# Patient Record
Sex: Male | Born: 1935 | Race: White | Hispanic: No | Marital: Married | Smoking: Former smoker
Health system: Southern US, Community
[De-identification: ages and names within clinical notes are randomized; demographics above are authoritative.]

## PROBLEM LIST (undated history)

## (undated) DIAGNOSIS — I5022 Chronic systolic (congestive) heart failure: Secondary | ICD-10-CM

## (undated) DIAGNOSIS — N183 Chronic kidney disease, stage 3 unspecified: Secondary | ICD-10-CM

## (undated) DIAGNOSIS — N410 Acute prostatitis: Secondary | ICD-10-CM

## (undated) DIAGNOSIS — M542 Cervicalgia: Secondary | ICD-10-CM

## (undated) DIAGNOSIS — I714 Abdominal aortic aneurysm, without rupture, unspecified: Secondary | ICD-10-CM

## (undated) DIAGNOSIS — I255 Ischemic cardiomyopathy: Secondary | ICD-10-CM

## (undated) DIAGNOSIS — I739 Peripheral vascular disease, unspecified: Secondary | ICD-10-CM

## (undated) DIAGNOSIS — I4891 Unspecified atrial fibrillation: Secondary | ICD-10-CM

## (undated) DIAGNOSIS — Z7901 Long term (current) use of anticoagulants: Secondary | ICD-10-CM

## (undated) DIAGNOSIS — I639 Cerebral infarction, unspecified: Secondary | ICD-10-CM

## (undated) DIAGNOSIS — H9190 Unspecified hearing loss, unspecified ear: Secondary | ICD-10-CM

## (undated) DIAGNOSIS — H8309 Labyrinthitis, unspecified ear: Secondary | ICD-10-CM

## (undated) DIAGNOSIS — Z95810 Presence of automatic (implantable) cardiac defibrillator: Secondary | ICD-10-CM

## (undated) DIAGNOSIS — I10 Essential (primary) hypertension: Secondary | ICD-10-CM

## (undated) HISTORY — DX: Ischemic cardiomyopathy: I25.5

## (undated) HISTORY — DX: Abdominal aortic aneurysm, without rupture, unspecified: I71.40

## (undated) HISTORY — PX: CARDIOVERSION: SHX1299

## (undated) HISTORY — DX: Cerebral infarction, unspecified: I63.9

## (undated) HISTORY — DX: Labyrinthitis, unspecified ear: H83.09

## (undated) HISTORY — DX: Presence of automatic (implantable) cardiac defibrillator: Z95.810

## (undated) HISTORY — DX: Acute prostatitis: N41.0

## (undated) HISTORY — DX: Abdominal aortic aneurysm, without rupture: I71.4

## (undated) HISTORY — DX: Cervicalgia: M54.2

## (undated) HISTORY — DX: Chronic systolic (congestive) heart failure: I50.22

## (undated) HISTORY — DX: Peripheral vascular disease, unspecified: I73.9

## (undated) HISTORY — DX: Unspecified atrial fibrillation: I48.91

## (undated) HISTORY — DX: Essential (primary) hypertension: I10

## (undated) HISTORY — DX: Unspecified hearing loss, unspecified ear: H91.90

---

## 1989-03-22 HISTORY — PX: CORONARY ARTERY BYPASS GRAFT: SHX141

## 1996-07-15 HISTORY — PX: COLOSTOMY: SHX63

## 1996-07-15 HISTORY — PX: COLECTOMY: SHX59

## 1997-06-25 ENCOUNTER — Ambulatory Visit (HOSPITAL_COMMUNITY): Admission: RE | Admit: 1997-06-25 | Discharge: 1997-06-25 | Payer: Self-pay | Admitting: *Deleted

## 1997-09-14 ENCOUNTER — Inpatient Hospital Stay (HOSPITAL_COMMUNITY): Admission: EM | Admit: 1997-09-14 | Discharge: 1997-09-17 | Payer: Self-pay | Admitting: Emergency Medicine

## 1998-07-04 ENCOUNTER — Emergency Department (HOSPITAL_COMMUNITY): Admission: EM | Admit: 1998-07-04 | Discharge: 1998-07-04 | Payer: Self-pay | Admitting: Emergency Medicine

## 1999-01-05 ENCOUNTER — Encounter: Payer: Self-pay | Admitting: *Deleted

## 1999-01-05 ENCOUNTER — Encounter: Admission: RE | Admit: 1999-01-05 | Discharge: 1999-01-05 | Payer: Self-pay | Admitting: *Deleted

## 1999-01-22 ENCOUNTER — Ambulatory Visit: Admission: RE | Admit: 1999-01-22 | Discharge: 1999-01-22 | Payer: Self-pay | Admitting: *Deleted

## 1999-01-22 ENCOUNTER — Encounter: Payer: Self-pay | Admitting: *Deleted

## 1999-02-15 HISTORY — PX: CARDIAC CATHETERIZATION: SHX172

## 1999-04-05 ENCOUNTER — Ambulatory Visit (HOSPITAL_COMMUNITY): Admission: RE | Admit: 1999-04-05 | Discharge: 1999-04-06 | Payer: Self-pay | Admitting: *Deleted

## 1999-05-16 HISTORY — PX: ABDOMINAL AORTIC ANEURYSM REPAIR: SUR1152

## 1999-05-18 ENCOUNTER — Inpatient Hospital Stay (HOSPITAL_COMMUNITY): Admission: RE | Admit: 1999-05-18 | Discharge: 1999-05-20 | Payer: Self-pay | Admitting: *Deleted

## 1999-05-18 ENCOUNTER — Encounter: Payer: Self-pay | Admitting: *Deleted

## 1999-08-23 ENCOUNTER — Encounter: Payer: Self-pay | Admitting: *Deleted

## 1999-08-23 ENCOUNTER — Encounter: Admission: RE | Admit: 1999-08-23 | Discharge: 1999-08-23 | Payer: Self-pay | Admitting: *Deleted

## 2000-02-28 ENCOUNTER — Encounter: Admission: RE | Admit: 2000-02-28 | Discharge: 2000-02-28 | Payer: Self-pay | Admitting: *Deleted

## 2000-02-28 ENCOUNTER — Encounter: Payer: Self-pay | Admitting: *Deleted

## 2000-06-29 ENCOUNTER — Encounter: Payer: Self-pay | Admitting: Family Medicine

## 2000-06-29 ENCOUNTER — Ambulatory Visit (HOSPITAL_COMMUNITY): Admission: RE | Admit: 2000-06-29 | Discharge: 2000-06-29 | Payer: Self-pay | Admitting: Family Medicine

## 2000-08-28 ENCOUNTER — Encounter: Admission: RE | Admit: 2000-08-28 | Discharge: 2000-08-28 | Payer: Self-pay | Admitting: *Deleted

## 2000-08-28 ENCOUNTER — Encounter: Payer: Self-pay | Admitting: *Deleted

## 2001-03-05 ENCOUNTER — Encounter: Payer: Self-pay | Admitting: *Deleted

## 2001-03-05 ENCOUNTER — Encounter: Admission: RE | Admit: 2001-03-05 | Discharge: 2001-03-05 | Payer: Self-pay | Admitting: *Deleted

## 2001-11-17 ENCOUNTER — Encounter: Payer: Self-pay | Admitting: Emergency Medicine

## 2001-11-17 ENCOUNTER — Emergency Department (HOSPITAL_COMMUNITY): Admission: EM | Admit: 2001-11-17 | Discharge: 2001-11-17 | Payer: Self-pay | Admitting: Emergency Medicine

## 2002-03-18 ENCOUNTER — Encounter: Admission: RE | Admit: 2002-03-18 | Discharge: 2002-03-18 | Payer: Self-pay | Admitting: *Deleted

## 2002-03-18 ENCOUNTER — Encounter: Payer: Self-pay | Admitting: *Deleted

## 2002-10-11 ENCOUNTER — Emergency Department (HOSPITAL_COMMUNITY): Admission: EM | Admit: 2002-10-11 | Discharge: 2002-10-11 | Payer: Self-pay | Admitting: Podiatry

## 2002-10-11 ENCOUNTER — Encounter: Payer: Self-pay | Admitting: *Deleted

## 2003-03-24 ENCOUNTER — Encounter: Admission: RE | Admit: 2003-03-24 | Discharge: 2003-03-24 | Payer: Self-pay | Admitting: *Deleted

## 2003-04-28 ENCOUNTER — Encounter: Admission: RE | Admit: 2003-04-28 | Discharge: 2003-04-28 | Payer: Self-pay | Admitting: Internal Medicine

## 2003-08-19 ENCOUNTER — Inpatient Hospital Stay (HOSPITAL_BASED_OUTPATIENT_CLINIC_OR_DEPARTMENT_OTHER): Admission: RE | Admit: 2003-08-19 | Discharge: 2003-08-19 | Payer: Self-pay | Admitting: Cardiovascular Disease

## 2003-09-29 ENCOUNTER — Inpatient Hospital Stay (HOSPITAL_COMMUNITY): Admission: RE | Admit: 2003-09-29 | Discharge: 2003-09-30 | Payer: Self-pay | Admitting: Internal Medicine

## 2004-01-04 ENCOUNTER — Ambulatory Visit: Payer: Self-pay | Admitting: Internal Medicine

## 2004-01-14 ENCOUNTER — Ambulatory Visit: Payer: Self-pay | Admitting: *Deleted

## 2004-02-15 HISTORY — PX: INSERT / REPLACE / REMOVE PACEMAKER: SUR710

## 2004-03-09 ENCOUNTER — Ambulatory Visit: Payer: Self-pay | Admitting: Internal Medicine

## 2004-03-10 ENCOUNTER — Ambulatory Visit: Payer: Self-pay | Admitting: *Deleted

## 2004-03-29 ENCOUNTER — Encounter: Admission: RE | Admit: 2004-03-29 | Discharge: 2004-03-29 | Payer: Self-pay | Admitting: *Deleted

## 2004-04-21 ENCOUNTER — Ambulatory Visit: Payer: Self-pay | Admitting: Internal Medicine

## 2004-04-21 ENCOUNTER — Ambulatory Visit: Payer: Self-pay | Admitting: *Deleted

## 2004-06-30 ENCOUNTER — Ambulatory Visit: Payer: Self-pay | Admitting: Internal Medicine

## 2004-07-06 ENCOUNTER — Ambulatory Visit: Payer: Self-pay | Admitting: Internal Medicine

## 2004-07-15 ENCOUNTER — Ambulatory Visit: Payer: Self-pay | Admitting: Internal Medicine

## 2004-07-20 ENCOUNTER — Ambulatory Visit: Payer: Self-pay | Admitting: *Deleted

## 2004-07-27 ENCOUNTER — Ambulatory Visit: Payer: Self-pay | Admitting: Cardiovascular Disease

## 2004-07-27 ENCOUNTER — Ambulatory Visit: Payer: Self-pay

## 2004-08-03 ENCOUNTER — Ambulatory Visit: Payer: Self-pay | Admitting: *Deleted

## 2004-08-03 ENCOUNTER — Ambulatory Visit: Payer: Self-pay | Admitting: Cardiology

## 2004-08-10 ENCOUNTER — Ambulatory Visit: Payer: Self-pay | Admitting: Cardiology

## 2004-08-16 ENCOUNTER — Ambulatory Visit: Payer: Self-pay | Admitting: Cardiology

## 2004-08-16 ENCOUNTER — Ambulatory Visit: Payer: Self-pay | Admitting: Internal Medicine

## 2004-08-25 ENCOUNTER — Ambulatory Visit: Payer: Self-pay | Admitting: *Deleted

## 2004-08-25 ENCOUNTER — Ambulatory Visit: Payer: Self-pay | Admitting: Cardiology

## 2004-09-01 ENCOUNTER — Ambulatory Visit: Payer: Self-pay | Admitting: Cardiology

## 2004-09-03 ENCOUNTER — Ambulatory Visit: Payer: Self-pay

## 2004-09-08 ENCOUNTER — Ambulatory Visit: Payer: Self-pay | Admitting: Cardiology

## 2004-09-10 ENCOUNTER — Ambulatory Visit (HOSPITAL_COMMUNITY): Admission: RE | Admit: 2004-09-10 | Discharge: 2004-09-10 | Payer: Self-pay | Admitting: Internal Medicine

## 2004-09-16 ENCOUNTER — Ambulatory Visit: Payer: Self-pay | Admitting: Cardiology

## 2004-09-23 ENCOUNTER — Ambulatory Visit: Payer: Self-pay | Admitting: Cardiology

## 2004-09-30 ENCOUNTER — Ambulatory Visit: Payer: Self-pay | Admitting: Cardiology

## 2004-10-07 ENCOUNTER — Ambulatory Visit: Payer: Self-pay | Admitting: Cardiology

## 2004-10-14 ENCOUNTER — Ambulatory Visit: Payer: Self-pay | Admitting: *Deleted

## 2004-10-14 ENCOUNTER — Ambulatory Visit: Payer: Self-pay | Admitting: Internal Medicine

## 2004-10-15 ENCOUNTER — Ambulatory Visit (HOSPITAL_COMMUNITY): Admission: RE | Admit: 2004-10-15 | Discharge: 2004-10-16 | Payer: Self-pay | Admitting: Internal Medicine

## 2004-10-17 ENCOUNTER — Ambulatory Visit: Payer: Self-pay | Admitting: Cardiology

## 2004-10-20 ENCOUNTER — Ambulatory Visit: Payer: Self-pay | Admitting: *Deleted

## 2004-10-26 ENCOUNTER — Ambulatory Visit: Payer: Self-pay | Admitting: *Deleted

## 2004-11-09 ENCOUNTER — Ambulatory Visit: Payer: Self-pay

## 2004-11-23 ENCOUNTER — Ambulatory Visit: Payer: Self-pay | Admitting: Cardiovascular Disease

## 2004-11-30 ENCOUNTER — Ambulatory Visit: Payer: Self-pay | Admitting: Internal Medicine

## 2004-12-21 ENCOUNTER — Ambulatory Visit: Payer: Self-pay | Admitting: Cardiology

## 2004-12-28 ENCOUNTER — Ambulatory Visit: Payer: Self-pay | Admitting: Internal Medicine

## 2004-12-29 ENCOUNTER — Ambulatory Visit: Payer: Self-pay | Admitting: Internal Medicine

## 2005-01-04 ENCOUNTER — Ambulatory Visit: Payer: Self-pay | Admitting: Internal Medicine

## 2005-01-07 ENCOUNTER — Ambulatory Visit: Payer: Self-pay | Admitting: Internal Medicine

## 2005-01-13 ENCOUNTER — Ambulatory Visit: Payer: Self-pay | Admitting: *Deleted

## 2005-01-18 ENCOUNTER — Ambulatory Visit: Payer: Self-pay | Admitting: Cardiovascular Disease

## 2005-01-25 ENCOUNTER — Ambulatory Visit: Payer: Self-pay | Admitting: Internal Medicine

## 2005-01-27 ENCOUNTER — Ambulatory Visit: Payer: Self-pay

## 2005-01-27 ENCOUNTER — Encounter: Payer: Self-pay | Admitting: Cardiology

## 2005-01-28 ENCOUNTER — Ambulatory Visit: Payer: Self-pay | Admitting: Cardiology

## 2005-01-28 ENCOUNTER — Ambulatory Visit: Payer: Self-pay | Admitting: *Deleted

## 2005-02-03 ENCOUNTER — Ambulatory Visit: Payer: Self-pay | Admitting: Internal Medicine

## 2005-02-11 ENCOUNTER — Ambulatory Visit: Payer: Self-pay | Admitting: Cardiology

## 2005-03-04 ENCOUNTER — Ambulatory Visit: Payer: Self-pay | Admitting: Cardiology

## 2005-03-24 ENCOUNTER — Encounter: Admission: RE | Admit: 2005-03-24 | Discharge: 2005-03-24 | Payer: Self-pay | Admitting: *Deleted

## 2005-03-31 ENCOUNTER — Ambulatory Visit: Payer: Self-pay | Admitting: Cardiology

## 2005-04-25 ENCOUNTER — Ambulatory Visit: Payer: Self-pay | Admitting: Internal Medicine

## 2005-04-28 ENCOUNTER — Ambulatory Visit: Payer: Self-pay | Admitting: Cardiology

## 2005-05-26 ENCOUNTER — Ambulatory Visit: Payer: Self-pay | Admitting: Internal Medicine

## 2005-06-07 ENCOUNTER — Ambulatory Visit: Payer: Self-pay | Admitting: *Deleted

## 2005-06-16 ENCOUNTER — Ambulatory Visit: Payer: Self-pay | Admitting: Cardiology

## 2005-06-27 ENCOUNTER — Ambulatory Visit: Payer: Self-pay | Admitting: Internal Medicine

## 2005-06-30 ENCOUNTER — Ambulatory Visit: Payer: Self-pay | Admitting: Cardiology

## 2005-07-15 ENCOUNTER — Ambulatory Visit: Payer: Self-pay | Admitting: Internal Medicine

## 2005-07-21 ENCOUNTER — Ambulatory Visit: Payer: Self-pay | Admitting: Internal Medicine

## 2005-07-26 ENCOUNTER — Ambulatory Visit: Payer: Self-pay | Admitting: *Deleted

## 2005-08-23 ENCOUNTER — Ambulatory Visit: Payer: Self-pay | Admitting: Cardiology

## 2005-09-20 ENCOUNTER — Ambulatory Visit: Payer: Self-pay | Admitting: Cardiology

## 2005-10-06 ENCOUNTER — Ambulatory Visit: Payer: Self-pay | Admitting: Cardiology

## 2005-10-13 ENCOUNTER — Ambulatory Visit: Payer: Self-pay | Admitting: Internal Medicine

## 2005-10-20 ENCOUNTER — Ambulatory Visit: Payer: Self-pay | Admitting: Cardiology

## 2005-11-15 ENCOUNTER — Ambulatory Visit: Payer: Self-pay | Admitting: *Deleted

## 2005-11-25 ENCOUNTER — Ambulatory Visit: Payer: Self-pay | Admitting: Cardiology

## 2005-12-12 ENCOUNTER — Ambulatory Visit: Payer: Self-pay | Admitting: *Deleted

## 2005-12-13 ENCOUNTER — Ambulatory Visit: Payer: Self-pay | Admitting: Cardiology

## 2006-01-03 ENCOUNTER — Ambulatory Visit: Payer: Self-pay | Admitting: Cardiovascular Disease

## 2006-01-04 ENCOUNTER — Ambulatory Visit: Payer: Self-pay | Admitting: Internal Medicine

## 2006-01-04 LAB — CONVERTED CEMR LAB
ALT: 21 units/L (ref 0–40)
LDL Cholesterol: 96 mg/dL (ref 0–99)
VLDL: 22 mg/dL (ref 0–40)

## 2006-01-10 ENCOUNTER — Ambulatory Visit: Payer: Self-pay | Admitting: Internal Medicine

## 2006-01-12 ENCOUNTER — Ambulatory Visit: Payer: Self-pay | Admitting: Internal Medicine

## 2006-01-31 ENCOUNTER — Ambulatory Visit: Payer: Self-pay | Admitting: Cardiology

## 2006-01-31 ENCOUNTER — Ambulatory Visit: Payer: Self-pay

## 2006-02-21 ENCOUNTER — Ambulatory Visit: Payer: Self-pay | Admitting: Cardiology

## 2006-02-22 ENCOUNTER — Ambulatory Visit: Payer: Self-pay | Admitting: Internal Medicine

## 2006-03-07 ENCOUNTER — Ambulatory Visit: Payer: Self-pay | Admitting: *Deleted

## 2006-03-21 ENCOUNTER — Ambulatory Visit: Payer: Self-pay | Admitting: Cardiology

## 2006-04-04 ENCOUNTER — Ambulatory Visit: Payer: Self-pay | Admitting: Cardiology

## 2006-04-18 ENCOUNTER — Ambulatory Visit: Payer: Self-pay | Admitting: Cardiology

## 2006-04-20 ENCOUNTER — Ambulatory Visit: Payer: Self-pay | Admitting: Internal Medicine

## 2006-04-27 ENCOUNTER — Encounter: Admission: RE | Admit: 2006-04-27 | Discharge: 2006-04-27 | Payer: Self-pay | Admitting: *Deleted

## 2006-05-02 ENCOUNTER — Ambulatory Visit: Payer: Self-pay | Admitting: Cardiology

## 2006-05-18 ENCOUNTER — Ambulatory Visit: Payer: Self-pay | Admitting: *Deleted

## 2006-05-23 ENCOUNTER — Ambulatory Visit: Payer: Self-pay | Admitting: Cardiology

## 2006-06-13 ENCOUNTER — Ambulatory Visit: Payer: Self-pay | Admitting: Cardiology

## 2006-06-27 ENCOUNTER — Ambulatory Visit: Payer: Self-pay | Admitting: Cardiology

## 2006-06-28 ENCOUNTER — Ambulatory Visit: Payer: Self-pay | Admitting: *Deleted

## 2006-06-29 ENCOUNTER — Ambulatory Visit: Payer: Self-pay | Admitting: *Deleted

## 2006-06-29 LAB — CONVERTED CEMR LAB
AST: 29 units/L (ref 0–37)
Alkaline Phosphatase: 56 units/L (ref 39–117)
BUN: 17 mg/dL (ref 6–23)
Bilirubin, Direct: 0.2 mg/dL (ref 0.0–0.3)
Calcium: 9 mg/dL (ref 8.4–10.5)
Chloride: 106 meq/L (ref 96–112)
Creatinine, Ser: 1.2 mg/dL (ref 0.4–1.5)
GFR calc Af Amer: 75 mL/min
GFR calc non Af Amer: 62 mL/min
HDL: 36.5 mg/dL — ABNORMAL LOW (ref >39.0)
Total Bilirubin: 1 mg/dL (ref 0.3–1.2)
Total CHOL/HDL Ratio: 4.1
Total Protein: 7.1 g/dL (ref 6.0–8.3)
Triglycerides: 151 mg/dL — ABNORMAL HIGH (ref 0–149)
VLDL: 30 mg/dL (ref 0–40)

## 2006-07-18 ENCOUNTER — Ambulatory Visit: Payer: Self-pay | Admitting: Cardiology

## 2006-08-08 ENCOUNTER — Ambulatory Visit: Payer: Self-pay | Admitting: Cardiology

## 2006-08-11 ENCOUNTER — Ambulatory Visit: Payer: Self-pay | Admitting: Internal Medicine

## 2006-09-05 ENCOUNTER — Ambulatory Visit: Payer: Self-pay | Admitting: Internal Medicine

## 2006-09-08 ENCOUNTER — Encounter: Payer: Self-pay | Admitting: Internal Medicine

## 2006-09-08 DIAGNOSIS — Z8719 Personal history of other diseases of the digestive system: Secondary | ICD-10-CM

## 2006-09-08 DIAGNOSIS — Z8679 Personal history of other diseases of the circulatory system: Secondary | ICD-10-CM | POA: Insufficient documentation

## 2006-10-02 ENCOUNTER — Ambulatory Visit: Payer: Self-pay | Admitting: Internal Medicine

## 2006-10-03 ENCOUNTER — Ambulatory Visit: Payer: Self-pay | Admitting: Cardiology

## 2006-10-24 ENCOUNTER — Ambulatory Visit: Payer: Self-pay | Admitting: Cardiology

## 2006-11-10 ENCOUNTER — Ambulatory Visit: Payer: Self-pay | Admitting: Internal Medicine

## 2006-11-21 ENCOUNTER — Ambulatory Visit: Payer: Self-pay | Admitting: Cardiovascular Disease

## 2006-12-19 ENCOUNTER — Ambulatory Visit: Payer: Self-pay | Admitting: Cardiology

## 2006-12-20 ENCOUNTER — Ambulatory Visit: Payer: Self-pay | Admitting: Internal Medicine

## 2006-12-21 ENCOUNTER — Ambulatory Visit: Payer: Self-pay | Admitting: *Deleted

## 2006-12-21 ENCOUNTER — Encounter: Admission: RE | Admit: 2006-12-21 | Discharge: 2006-12-21 | Payer: Self-pay | Admitting: *Deleted

## 2007-01-16 ENCOUNTER — Ambulatory Visit: Payer: Self-pay | Admitting: Cardiology

## 2007-01-22 ENCOUNTER — Ambulatory Visit: Payer: Self-pay | Admitting: Internal Medicine

## 2007-01-22 DIAGNOSIS — M542 Cervicalgia: Secondary | ICD-10-CM

## 2007-01-22 DIAGNOSIS — H9190 Unspecified hearing loss, unspecified ear: Secondary | ICD-10-CM | POA: Insufficient documentation

## 2007-01-30 ENCOUNTER — Telehealth: Payer: Self-pay | Admitting: Internal Medicine

## 2007-01-30 ENCOUNTER — Ambulatory Visit: Payer: Self-pay | Admitting: Internal Medicine

## 2007-01-31 ENCOUNTER — Ambulatory Visit: Payer: Self-pay | Admitting: Internal Medicine

## 2007-01-31 DIAGNOSIS — H8309 Labyrinthitis, unspecified ear: Secondary | ICD-10-CM | POA: Insufficient documentation

## 2007-02-01 ENCOUNTER — Encounter (INDEPENDENT_AMBULATORY_CARE_PROVIDER_SITE_OTHER): Payer: Self-pay | Admitting: *Deleted

## 2007-02-20 ENCOUNTER — Telehealth: Payer: Self-pay | Admitting: Internal Medicine

## 2007-02-22 ENCOUNTER — Encounter (INDEPENDENT_AMBULATORY_CARE_PROVIDER_SITE_OTHER): Payer: Self-pay | Admitting: *Deleted

## 2007-02-23 ENCOUNTER — Encounter: Payer: Self-pay | Admitting: Internal Medicine

## 2007-02-27 ENCOUNTER — Ambulatory Visit: Payer: Self-pay | Admitting: Cardiovascular Disease

## 2007-03-08 ENCOUNTER — Telehealth: Payer: Self-pay | Admitting: Internal Medicine

## 2007-03-08 ENCOUNTER — Ambulatory Visit: Payer: Self-pay | Admitting: Internal Medicine

## 2007-03-08 DIAGNOSIS — N410 Acute prostatitis: Secondary | ICD-10-CM

## 2007-03-12 ENCOUNTER — Ambulatory Visit: Payer: Self-pay | Admitting: Internal Medicine

## 2007-03-12 ENCOUNTER — Ambulatory Visit: Payer: Self-pay | Admitting: Cardiology

## 2007-03-12 ENCOUNTER — Inpatient Hospital Stay (HOSPITAL_COMMUNITY): Admission: EM | Admit: 2007-03-12 | Discharge: 2007-03-14 | Payer: Self-pay | Admitting: Emergency Medicine

## 2007-03-13 ENCOUNTER — Encounter: Payer: Self-pay | Admitting: Internal Medicine

## 2007-03-16 ENCOUNTER — Ambulatory Visit: Payer: Self-pay | Admitting: Cardiology

## 2007-03-21 ENCOUNTER — Ambulatory Visit: Payer: Self-pay | Admitting: Internal Medicine

## 2007-03-21 DIAGNOSIS — E785 Hyperlipidemia, unspecified: Secondary | ICD-10-CM

## 2007-03-21 DIAGNOSIS — I2510 Atherosclerotic heart disease of native coronary artery without angina pectoris: Secondary | ICD-10-CM

## 2007-03-21 DIAGNOSIS — I4891 Unspecified atrial fibrillation: Secondary | ICD-10-CM | POA: Insufficient documentation

## 2007-03-22 ENCOUNTER — Ambulatory Visit: Payer: Self-pay | Admitting: Internal Medicine

## 2007-03-23 ENCOUNTER — Ambulatory Visit: Payer: Self-pay | Admitting: Cardiology

## 2007-04-09 ENCOUNTER — Ambulatory Visit: Payer: Self-pay | Admitting: Internal Medicine

## 2007-04-09 ENCOUNTER — Ambulatory Visit: Payer: Self-pay | Admitting: Cardiology

## 2007-04-09 LAB — CONVERTED CEMR LAB
BUN: 16 mg/dL (ref 6–23)
CO2: 29 meq/L (ref 19–32)
Chloride: 104 meq/L (ref 96–112)
Creatinine, Ser: 1.1 mg/dL (ref 0.4–1.5)
GFR calc non Af Amer: 69 mL/min
Glucose, Bld: 98 mg/dL (ref 70–99)
HCT: 39.6 % (ref 39.0–52.0)
Hemoglobin: 13 g/dL (ref 13.0–17.0)
Lymphocytes Relative: 18.2 % (ref 12.0–46.0)
MCV: 100.9 fL — ABNORMAL HIGH (ref 78.0–100.0)
Monocytes Absolute: 0.3 10*3/uL (ref 0.2–0.7)
Neutro Abs: 4.3 10*3/uL (ref 1.4–7.7)
Potassium: 4.5 meq/L (ref 3.5–5.1)
Prothrombin Time: 19.3 s — ABNORMAL HIGH (ref 10.9–13.3)
RBC: 3.92 M/uL — ABNORMAL LOW (ref 4.22–5.81)
RDW: 13.3 % (ref 11.5–14.6)
aPTT: 31.8 s — ABNORMAL HIGH (ref 21.7–29.8)

## 2007-04-10 ENCOUNTER — Encounter: Admission: RE | Admit: 2007-04-10 | Discharge: 2007-04-10 | Payer: Self-pay | Admitting: Neurology

## 2007-04-12 ENCOUNTER — Ambulatory Visit: Payer: Self-pay | Admitting: Internal Medicine

## 2007-04-12 ENCOUNTER — Ambulatory Visit (HOSPITAL_COMMUNITY): Admission: RE | Admit: 2007-04-12 | Discharge: 2007-04-12 | Payer: Self-pay | Admitting: Internal Medicine

## 2007-04-14 ENCOUNTER — Encounter: Admission: RE | Admit: 2007-04-14 | Discharge: 2007-04-14 | Payer: Self-pay | Admitting: Neurology

## 2007-04-17 ENCOUNTER — Encounter: Payer: Self-pay | Admitting: Internal Medicine

## 2007-04-23 ENCOUNTER — Ambulatory Visit: Payer: Self-pay | Admitting: Cardiology

## 2007-05-01 ENCOUNTER — Ambulatory Visit: Payer: Self-pay | Admitting: Internal Medicine

## 2007-05-10 ENCOUNTER — Ambulatory Visit: Payer: Self-pay | Admitting: Internal Medicine

## 2007-05-16 ENCOUNTER — Ambulatory Visit: Payer: Self-pay | Admitting: Internal Medicine

## 2007-05-24 ENCOUNTER — Ambulatory Visit: Payer: Self-pay | Admitting: Internal Medicine

## 2007-06-07 ENCOUNTER — Ambulatory Visit: Payer: Self-pay | Admitting: Internal Medicine

## 2007-06-14 ENCOUNTER — Ambulatory Visit: Payer: Self-pay | Admitting: Internal Medicine

## 2007-06-15 LAB — CONVERTED CEMR LAB
CO2: 30 meq/L (ref 19–32)
Chloride: 108 meq/L (ref 96–112)
GFR calc Af Amer: 75 mL/min
HCT: 42.3 % (ref 39.0–52.0)
HDL: 30.3 mg/dL — ABNORMAL LOW (ref >39.0)
Hemoglobin: 13.9 g/dL (ref 13.0–17.0)
Lymphocytes Relative: 23 % (ref 12.0–46.0)
MCHC: 33 g/dL (ref 30.0–36.0)
MCV: 99.6 fL (ref 78.0–100.0)
Neutrophils Relative %: 64.4 % (ref 43.0–77.0)
Platelets: 102 10*3/uL — ABNORMAL LOW (ref 150–400)
Potassium: 4.5 meq/L (ref 3.5–5.1)
Sodium: 143 meq/L (ref 135–145)
Total CHOL/HDL Ratio: 4.3
Triglycerides: 110 mg/dL (ref 0–149)
VLDL: 22 mg/dL (ref 0–40)
WBC: 5.2 10*3/uL (ref 4.5–10.5)

## 2007-06-19 ENCOUNTER — Encounter: Payer: Self-pay | Admitting: Internal Medicine

## 2007-06-21 ENCOUNTER — Ambulatory Visit: Payer: Self-pay | Admitting: *Deleted

## 2007-07-05 ENCOUNTER — Ambulatory Visit: Payer: Self-pay | Admitting: Internal Medicine

## 2007-07-10 ENCOUNTER — Telehealth: Payer: Self-pay | Admitting: Internal Medicine

## 2007-07-24 ENCOUNTER — Ambulatory Visit: Payer: Self-pay | Admitting: Internal Medicine

## 2007-08-02 ENCOUNTER — Ambulatory Visit: Payer: Self-pay | Admitting: Cardiology

## 2007-08-13 ENCOUNTER — Ambulatory Visit: Payer: Self-pay | Admitting: Internal Medicine

## 2007-08-13 LAB — CONVERTED CEMR LAB
BUN: 17 mg/dL (ref 6–23)
CO2: 27 meq/L (ref 19–32)
Calcium: 9.1 mg/dL (ref 8.4–10.5)
Chloride: 106 meq/L (ref 96–112)
GFR calc Af Amer: 68 mL/min
Magnesium: 2.2 mg/dL (ref 1.5–2.5)
Potassium: 4.3 meq/L (ref 3.5–5.1)

## 2007-08-14 ENCOUNTER — Inpatient Hospital Stay (HOSPITAL_COMMUNITY): Admission: AD | Admit: 2007-08-14 | Discharge: 2007-08-17 | Payer: Self-pay | Admitting: Internal Medicine

## 2007-08-14 ENCOUNTER — Ambulatory Visit: Payer: Self-pay | Admitting: Internal Medicine

## 2007-08-22 ENCOUNTER — Ambulatory Visit: Payer: Self-pay | Admitting: Internal Medicine

## 2007-08-22 LAB — CONVERTED CEMR LAB
BUN: 17 mg/dL (ref 6–23)
Chloride: 105 meq/L (ref 96–112)
Glucose, Bld: 75 mg/dL (ref 70–99)
Potassium: 4.6 meq/L (ref 3.5–5.1)
Sodium: 138 meq/L (ref 135–145)

## 2007-08-30 ENCOUNTER — Ambulatory Visit: Payer: Self-pay | Admitting: Cardiovascular Disease

## 2007-09-20 ENCOUNTER — Ambulatory Visit: Payer: Self-pay | Admitting: Internal Medicine

## 2007-09-27 ENCOUNTER — Ambulatory Visit: Payer: Self-pay | Admitting: Internal Medicine

## 2007-10-16 ENCOUNTER — Ambulatory Visit: Payer: Self-pay | Admitting: Internal Medicine

## 2007-10-17 ENCOUNTER — Ambulatory Visit: Payer: Self-pay | Admitting: Internal Medicine

## 2007-10-17 LAB — CONVERTED CEMR LAB
BUN: 18 mg/dL (ref 6–23)
Chloride: 111 meq/L (ref 96–112)
GFR calc non Af Amer: 62 mL/min
Glucose, Bld: 92 mg/dL (ref 70–99)
Potassium: 4.7 meq/L (ref 3.5–5.1)
Sodium: 140 meq/L (ref 135–145)

## 2007-10-25 ENCOUNTER — Ambulatory Visit: Payer: Self-pay | Admitting: Cardiology

## 2007-11-22 ENCOUNTER — Ambulatory Visit: Payer: Self-pay | Admitting: Cardiology

## 2007-12-03 ENCOUNTER — Telehealth: Payer: Self-pay | Admitting: Internal Medicine

## 2007-12-17 ENCOUNTER — Ambulatory Visit: Payer: Self-pay | Admitting: Internal Medicine

## 2007-12-18 ENCOUNTER — Encounter (INDEPENDENT_AMBULATORY_CARE_PROVIDER_SITE_OTHER): Payer: Self-pay | Admitting: *Deleted

## 2007-12-20 ENCOUNTER — Ambulatory Visit: Payer: Self-pay | Admitting: *Deleted

## 2007-12-20 ENCOUNTER — Encounter: Admission: RE | Admit: 2007-12-20 | Discharge: 2007-12-20 | Payer: Self-pay | Admitting: *Deleted

## 2007-12-25 ENCOUNTER — Ambulatory Visit: Payer: Self-pay | Admitting: Internal Medicine

## 2007-12-26 ENCOUNTER — Encounter
Admission: RE | Admit: 2007-12-26 | Discharge: 2008-03-25 | Payer: Self-pay | Admitting: Physical Medicine & Rehabilitation

## 2007-12-27 ENCOUNTER — Ambulatory Visit: Payer: Self-pay | Admitting: Physical Medicine & Rehabilitation

## 2008-01-17 ENCOUNTER — Ambulatory Visit: Payer: Self-pay | Admitting: Internal Medicine

## 2008-01-22 ENCOUNTER — Ambulatory Visit: Payer: Self-pay | Admitting: Cardiology

## 2008-01-25 ENCOUNTER — Ambulatory Visit: Payer: Self-pay | Admitting: Physical Medicine & Rehabilitation

## 2008-01-29 ENCOUNTER — Encounter
Admission: RE | Admit: 2008-01-29 | Discharge: 2008-02-12 | Payer: Self-pay | Admitting: Physical Medicine & Rehabilitation

## 2008-02-18 ENCOUNTER — Encounter
Admission: RE | Admit: 2008-02-18 | Discharge: 2008-02-29 | Payer: Self-pay | Admitting: Physical Medicine & Rehabilitation

## 2008-02-19 ENCOUNTER — Ambulatory Visit: Payer: Self-pay | Admitting: Internal Medicine

## 2008-02-22 ENCOUNTER — Ambulatory Visit: Payer: Self-pay | Admitting: Physical Medicine & Rehabilitation

## 2008-03-03 ENCOUNTER — Ambulatory Visit: Payer: Self-pay | Admitting: Internal Medicine

## 2008-03-03 DIAGNOSIS — R042 Hemoptysis: Secondary | ICD-10-CM | POA: Insufficient documentation

## 2008-03-04 ENCOUNTER — Ambulatory Visit: Payer: Self-pay | Admitting: Cardiology

## 2008-03-04 ENCOUNTER — Ambulatory Visit: Payer: Self-pay | Admitting: Internal Medicine

## 2008-03-04 ENCOUNTER — Telehealth: Payer: Self-pay | Admitting: Internal Medicine

## 2008-03-05 ENCOUNTER — Inpatient Hospital Stay (HOSPITAL_COMMUNITY): Admission: EM | Admit: 2008-03-05 | Discharge: 2008-03-07 | Payer: Self-pay | Admitting: Emergency Medicine

## 2008-03-05 ENCOUNTER — Encounter: Payer: Self-pay | Admitting: Internal Medicine

## 2008-03-13 ENCOUNTER — Ambulatory Visit: Payer: Self-pay | Admitting: Cardiology

## 2008-03-24 ENCOUNTER — Ambulatory Visit: Payer: Self-pay | Admitting: Internal Medicine

## 2008-04-10 ENCOUNTER — Ambulatory Visit: Payer: Self-pay | Admitting: Internal Medicine

## 2008-04-17 ENCOUNTER — Ambulatory Visit: Payer: Self-pay | Admitting: Internal Medicine

## 2008-04-24 ENCOUNTER — Ambulatory Visit: Payer: Self-pay | Admitting: Cardiovascular Disease

## 2008-05-02 ENCOUNTER — Ambulatory Visit: Payer: Self-pay | Admitting: Physical Medicine & Rehabilitation

## 2008-05-02 ENCOUNTER — Encounter
Admission: RE | Admit: 2008-05-02 | Discharge: 2008-05-02 | Payer: Self-pay | Admitting: Physical Medicine & Rehabilitation

## 2008-05-22 ENCOUNTER — Ambulatory Visit: Payer: Self-pay | Admitting: Cardiology

## 2008-06-03 ENCOUNTER — Ambulatory Visit: Payer: Self-pay | Admitting: Internal Medicine

## 2008-06-03 LAB — CONVERTED CEMR LAB
ALT: 19 units/L (ref 0–53)
Albumin: 3.7 g/dL (ref 3.5–5.2)
Calcium: 9 mg/dL (ref 8.4–10.5)
Cholesterol: 167 mg/dL (ref 0–200)
GFR calc non Af Amer: 47.85 mL/min (ref >60)
Glucose, Bld: 100 mg/dL — ABNORMAL HIGH (ref 70–99)
HDL: 33.3 mg/dL — ABNORMAL LOW (ref >39.00)
Potassium: 5.1 meq/L (ref 3.5–5.1)
Sodium: 140 meq/L (ref 135–145)
Total Bilirubin: 1.3 mg/dL — ABNORMAL HIGH (ref 0.3–1.2)
Total CHOL/HDL Ratio: 5
Triglycerides: 235 mg/dL — ABNORMAL HIGH (ref 0.0–149.0)

## 2008-06-05 ENCOUNTER — Ambulatory Visit: Payer: Self-pay | Admitting: Internal Medicine

## 2008-06-06 DIAGNOSIS — I714 Abdominal aortic aneurysm, without rupture, unspecified: Secondary | ICD-10-CM | POA: Insufficient documentation

## 2008-06-06 DIAGNOSIS — I739 Peripheral vascular disease, unspecified: Secondary | ICD-10-CM

## 2008-06-06 DIAGNOSIS — Z95810 Presence of automatic (implantable) cardiac defibrillator: Secondary | ICD-10-CM

## 2008-06-17 ENCOUNTER — Ambulatory Visit: Payer: Self-pay | Admitting: Internal Medicine

## 2008-06-19 ENCOUNTER — Ambulatory Visit: Payer: Self-pay | Admitting: *Deleted

## 2008-06-24 ENCOUNTER — Ambulatory Visit: Payer: Self-pay | Admitting: Internal Medicine

## 2008-07-08 ENCOUNTER — Ambulatory Visit: Payer: Self-pay | Admitting: Cardiology

## 2008-07-16 ENCOUNTER — Encounter: Payer: Self-pay | Admitting: *Deleted

## 2008-07-31 ENCOUNTER — Encounter (INDEPENDENT_AMBULATORY_CARE_PROVIDER_SITE_OTHER): Payer: Self-pay | Admitting: Pharmacist

## 2008-07-31 ENCOUNTER — Ambulatory Visit: Payer: Self-pay | Admitting: Internal Medicine

## 2008-07-31 LAB — CONVERTED CEMR LAB
POC INR: 2
Protime: 17.3

## 2008-08-20 ENCOUNTER — Encounter: Payer: Self-pay | Admitting: *Deleted

## 2008-08-26 ENCOUNTER — Ambulatory Visit: Payer: Self-pay | Admitting: Cardiology

## 2008-08-26 LAB — CONVERTED CEMR LAB: POC INR: 2.4

## 2008-09-12 ENCOUNTER — Encounter: Payer: Self-pay | Admitting: Internal Medicine

## 2008-09-23 ENCOUNTER — Ambulatory Visit: Payer: Self-pay | Admitting: Internal Medicine

## 2008-09-23 ENCOUNTER — Encounter (INDEPENDENT_AMBULATORY_CARE_PROVIDER_SITE_OTHER): Payer: Self-pay | Admitting: Cardiology

## 2008-09-23 LAB — CONVERTED CEMR LAB: POC INR: 2.7

## 2008-09-25 ENCOUNTER — Ambulatory Visit: Payer: Self-pay | Admitting: Internal Medicine

## 2008-10-21 ENCOUNTER — Ambulatory Visit: Payer: Self-pay | Admitting: Cardiology

## 2008-10-24 ENCOUNTER — Ambulatory Visit: Payer: Self-pay | Admitting: Internal Medicine

## 2008-10-24 DIAGNOSIS — M546 Pain in thoracic spine: Secondary | ICD-10-CM

## 2008-10-24 LAB — CONVERTED CEMR LAB
Albumin: 4 g/dL (ref 3.5–5.2)
Basophils Absolute: 0 10*3/uL (ref 0.0–0.1)
Basophils Relative: 0.6 % (ref 0.0–3.0)
CO2: 31 meq/L (ref 19–32)
Calcium: 9.3 mg/dL (ref 8.4–10.5)
Chloride: 106 meq/L (ref 96–112)
Eosinophils Absolute: 0.2 10*3/uL (ref 0.0–0.7)
Glucose, Bld: 104 mg/dL — ABNORMAL HIGH (ref 70–99)
HCT: 41.2 % (ref 39.0–52.0)
HDL: 39.3 mg/dL (ref >39.00)
Hemoglobin: 14.1 g/dL (ref 13.0–17.0)
LDL Cholesterol: 97 mg/dL (ref 0–99)
Lymphocytes Relative: 22.4 % (ref 12.0–46.0)
Lymphs Abs: 1.1 10*3/uL (ref 0.7–4.0)
MCHC: 34.2 g/dL (ref 30.0–36.0)
Neutro Abs: 3 10*3/uL (ref 1.4–7.7)
Potassium: 5.2 meq/L — ABNORMAL HIGH (ref 3.5–5.1)
RBC: 4.05 M/uL — ABNORMAL LOW (ref 4.22–5.81)
RDW: 12.5 % (ref 11.5–14.6)
Sodium: 140 meq/L (ref 135–145)
Total Bilirubin: 1.5 mg/dL — ABNORMAL HIGH (ref 0.3–1.2)
Total CHOL/HDL Ratio: 4
Triglycerides: 137 mg/dL (ref 0.0–149.0)
VLDL: 27.4 mg/dL (ref 0.0–40.0)

## 2008-11-18 ENCOUNTER — Ambulatory Visit: Payer: Self-pay | Admitting: Cardiology

## 2008-11-18 LAB — CONVERTED CEMR LAB: POC INR: 2.4

## 2008-12-16 ENCOUNTER — Ambulatory Visit: Payer: Self-pay | Admitting: Internal Medicine

## 2008-12-16 LAB — CONVERTED CEMR LAB: POC INR: 2.6

## 2008-12-25 ENCOUNTER — Ambulatory Visit: Payer: Self-pay | Admitting: Internal Medicine

## 2008-12-29 ENCOUNTER — Encounter: Payer: Self-pay | Admitting: Internal Medicine

## 2008-12-30 ENCOUNTER — Telehealth (INDEPENDENT_AMBULATORY_CARE_PROVIDER_SITE_OTHER): Payer: Self-pay | Admitting: *Deleted

## 2009-01-12 ENCOUNTER — Encounter: Admission: RE | Admit: 2009-01-12 | Discharge: 2009-01-12 | Payer: Self-pay | Admitting: Surgery

## 2009-01-13 ENCOUNTER — Ambulatory Visit: Payer: Self-pay | Admitting: Cardiology

## 2009-01-19 ENCOUNTER — Encounter: Payer: Self-pay | Admitting: Internal Medicine

## 2009-01-19 ENCOUNTER — Ambulatory Visit: Payer: Self-pay | Admitting: Surgery

## 2009-02-10 ENCOUNTER — Ambulatory Visit: Payer: Self-pay | Admitting: Cardiology

## 2009-02-10 ENCOUNTER — Encounter (INDEPENDENT_AMBULATORY_CARE_PROVIDER_SITE_OTHER): Payer: Self-pay | Admitting: *Deleted

## 2009-02-10 LAB — CONVERTED CEMR LAB: POC INR: 2.9

## 2009-03-10 ENCOUNTER — Ambulatory Visit: Payer: Self-pay | Admitting: Cardiovascular Disease

## 2009-03-10 ENCOUNTER — Telehealth (INDEPENDENT_AMBULATORY_CARE_PROVIDER_SITE_OTHER): Payer: Self-pay | Admitting: Cardiology

## 2009-03-10 LAB — CONVERTED CEMR LAB: POC INR: 2.9

## 2009-03-28 ENCOUNTER — Encounter: Payer: Self-pay | Admitting: Internal Medicine

## 2009-03-30 ENCOUNTER — Ambulatory Visit: Payer: Self-pay | Admitting: Internal Medicine

## 2009-04-01 ENCOUNTER — Encounter: Payer: Self-pay | Admitting: Internal Medicine

## 2009-04-07 ENCOUNTER — Ambulatory Visit: Payer: Self-pay | Admitting: Cardiology

## 2009-04-07 LAB — CONVERTED CEMR LAB: POC INR: 1.6

## 2009-04-08 ENCOUNTER — Encounter: Payer: Self-pay | Admitting: Internal Medicine

## 2009-04-09 ENCOUNTER — Ambulatory Visit: Payer: Self-pay | Admitting: Cardiology

## 2009-04-10 ENCOUNTER — Encounter: Payer: Self-pay | Admitting: Internal Medicine

## 2009-04-16 ENCOUNTER — Encounter (INDEPENDENT_AMBULATORY_CARE_PROVIDER_SITE_OTHER): Payer: Self-pay | Admitting: Cardiology

## 2009-04-16 ENCOUNTER — Ambulatory Visit: Payer: Self-pay | Admitting: Cardiology

## 2009-04-16 LAB — CONVERTED CEMR LAB: POC INR: 2.4

## 2009-05-14 ENCOUNTER — Ambulatory Visit: Payer: Self-pay | Admitting: Internal Medicine

## 2009-06-01 ENCOUNTER — Telehealth: Payer: Self-pay | Admitting: Internal Medicine

## 2009-06-11 ENCOUNTER — Ambulatory Visit: Payer: Self-pay | Admitting: Cardiology

## 2009-06-11 LAB — CONVERTED CEMR LAB: POC INR: 2.7

## 2009-06-16 ENCOUNTER — Ambulatory Visit: Payer: Self-pay | Admitting: Internal Medicine

## 2009-06-16 DIAGNOSIS — I5022 Chronic systolic (congestive) heart failure: Secondary | ICD-10-CM

## 2009-06-18 LAB — CONVERTED CEMR LAB
BUN: 28 mg/dL — ABNORMAL HIGH (ref 6–23)
Chloride: 104 meq/L (ref 96–112)
Creatinine, Ser: 1.7 mg/dL — ABNORMAL HIGH (ref 0.4–1.5)
Glucose, Bld: 96 mg/dL (ref 70–99)
Potassium: 5 meq/L (ref 3.5–5.1)

## 2009-07-01 ENCOUNTER — Encounter
Admission: RE | Admit: 2009-07-01 | Discharge: 2009-07-06 | Payer: Self-pay | Admitting: Physical Medicine & Rehabilitation

## 2009-07-06 ENCOUNTER — Ambulatory Visit: Payer: Self-pay | Admitting: Physical Medicine & Rehabilitation

## 2009-07-09 ENCOUNTER — Ambulatory Visit: Payer: Self-pay | Admitting: Cardiology

## 2009-07-28 ENCOUNTER — Telehealth: Payer: Self-pay | Admitting: Internal Medicine

## 2009-08-06 ENCOUNTER — Ambulatory Visit: Payer: Self-pay | Admitting: Internal Medicine

## 2009-09-01 ENCOUNTER — Ambulatory Visit: Payer: Self-pay | Admitting: Internal Medicine

## 2009-09-02 ENCOUNTER — Ambulatory Visit: Payer: Self-pay | Admitting: Internal Medicine

## 2009-09-02 LAB — CONVERTED CEMR LAB
BUN: 30 mg/dL — ABNORMAL HIGH (ref 6–23)
CO2: 30 meq/L (ref 19–32)
Glucose, Bld: 82 mg/dL (ref 70–99)
HDL: 33.2 mg/dL — ABNORMAL LOW (ref >39.00)
LDL Cholesterol: 86 mg/dL (ref 0–99)
Potassium: 5.3 meq/L — ABNORMAL HIGH (ref 3.5–5.1)
Pro B Natriuretic peptide (BNP): 138.5 pg/mL — ABNORMAL HIGH (ref 0.0–100.0)
Sodium: 139 meq/L (ref 135–145)
Total Bilirubin: 0.9 mg/dL (ref 0.3–1.2)
Total CHOL/HDL Ratio: 4
VLDL: 24 mg/dL (ref 0.0–40.0)

## 2009-09-03 ENCOUNTER — Ambulatory Visit: Payer: Self-pay | Admitting: Cardiology

## 2009-09-03 LAB — CONVERTED CEMR LAB: POC INR: 2.2

## 2009-09-04 ENCOUNTER — Ambulatory Visit (HOSPITAL_COMMUNITY)
Admission: RE | Admit: 2009-09-04 | Discharge: 2009-09-04 | Payer: Self-pay | Source: Home / Self Care | Admitting: Internal Medicine

## 2009-09-05 ENCOUNTER — Encounter: Payer: Self-pay | Admitting: Internal Medicine

## 2009-09-09 ENCOUNTER — Telehealth (INDEPENDENT_AMBULATORY_CARE_PROVIDER_SITE_OTHER): Payer: Self-pay | Admitting: *Deleted

## 2009-09-14 ENCOUNTER — Telehealth: Payer: Self-pay | Admitting: Internal Medicine

## 2009-09-17 ENCOUNTER — Encounter: Payer: Self-pay | Admitting: Internal Medicine

## 2009-09-17 ENCOUNTER — Ambulatory Visit: Payer: Self-pay | Admitting: Internal Medicine

## 2009-09-18 ENCOUNTER — Encounter: Payer: Self-pay | Admitting: Internal Medicine

## 2009-10-01 ENCOUNTER — Ambulatory Visit: Payer: Self-pay | Admitting: Cardiovascular Disease

## 2009-10-01 LAB — CONVERTED CEMR LAB: POC INR: 2.7

## 2009-10-29 ENCOUNTER — Ambulatory Visit: Payer: Self-pay | Admitting: Cardiology

## 2009-10-29 LAB — CONVERTED CEMR LAB: POC INR: 3.2

## 2009-11-10 ENCOUNTER — Telehealth (INDEPENDENT_AMBULATORY_CARE_PROVIDER_SITE_OTHER): Payer: Self-pay | Admitting: *Deleted

## 2009-11-16 ENCOUNTER — Ambulatory Visit: Payer: Self-pay | Admitting: Internal Medicine

## 2009-11-16 LAB — CONVERTED CEMR LAB: POC INR: 2.1

## 2009-12-01 ENCOUNTER — Ambulatory Visit: Payer: Self-pay | Admitting: Internal Medicine

## 2009-12-01 ENCOUNTER — Telehealth: Payer: Self-pay | Admitting: Internal Medicine

## 2009-12-01 DIAGNOSIS — N411 Chronic prostatitis: Secondary | ICD-10-CM

## 2009-12-01 LAB — CONVERTED CEMR LAB
Bilirubin Urine: NEGATIVE
Ketones, urine, test strip: NEGATIVE
Nitrite: NEGATIVE
Specific Gravity, Urine: 1.02
Urobilinogen, UA: 0.2

## 2009-12-08 ENCOUNTER — Ambulatory Visit: Payer: Self-pay | Admitting: Cardiology

## 2009-12-17 ENCOUNTER — Ambulatory Visit: Payer: Self-pay | Admitting: Cardiology

## 2009-12-21 ENCOUNTER — Ambulatory Visit: Payer: Self-pay | Admitting: Internal Medicine

## 2010-01-12 ENCOUNTER — Ambulatory Visit: Payer: Self-pay | Admitting: Cardiology

## 2010-01-12 LAB — CONVERTED CEMR LAB: POC INR: 2.4

## 2010-02-09 ENCOUNTER — Ambulatory Visit: Payer: Self-pay

## 2010-02-10 ENCOUNTER — Ambulatory Visit: Admission: RE | Admit: 2010-02-10 | Discharge: 2010-02-10 | Payer: Self-pay | Source: Home / Self Care

## 2010-02-20 ENCOUNTER — Encounter: Payer: Self-pay | Admitting: Internal Medicine

## 2010-02-24 ENCOUNTER — Ambulatory Visit
Admission: RE | Admit: 2010-02-24 | Discharge: 2010-02-24 | Payer: Self-pay | Source: Home / Self Care | Attending: Internal Medicine | Admitting: Internal Medicine

## 2010-02-24 ENCOUNTER — Other Ambulatory Visit: Payer: Self-pay

## 2010-02-24 LAB — BASIC METABOLIC PANEL
BUN: 31 mg/dL — ABNORMAL HIGH (ref 6–23)
CO2: 29 mEq/L (ref 19–32)
Calcium: 9.1 mg/dL (ref 8.4–10.5)
Chloride: 104 mEq/L (ref 96–112)
Creatinine, Ser: 1.5 mg/dL (ref 0.4–1.5)
GFR: 49.54 mL/min — ABNORMAL LOW (ref >60.00)
Glucose, Bld: 84 mg/dL (ref 70–99)
Potassium: 5.6 mEq/L — ABNORMAL HIGH (ref 3.5–5.1)
Sodium: 139 mEq/L (ref 135–145)

## 2010-02-24 LAB — MAGNESIUM: Magnesium: 2.3 mg/dL (ref 1.5–2.5)

## 2010-03-07 ENCOUNTER — Encounter: Payer: Self-pay | Admitting: Surgery

## 2010-03-07 ENCOUNTER — Encounter: Payer: Self-pay | Admitting: *Deleted

## 2010-03-08 ENCOUNTER — Encounter: Payer: Self-pay | Admitting: Internal Medicine

## 2010-03-09 ENCOUNTER — Ambulatory Visit: Admission: RE | Admit: 2010-03-09 | Discharge: 2010-03-09 | Payer: Self-pay | Source: Home / Self Care

## 2010-03-16 NOTE — Cardiovascular Report (Signed)
Summary: Office Visit Remote   Office Visit Remote   Imported By: Roderic Ovens 10/05/2009 13:16:42  _____________________________________________________________________  External Attachment:    Type:   Image     Comment:   External Document

## 2010-03-16 NOTE — Assessment & Plan Note (Signed)
Summary: BURNING SENSATION WHEN URINATES/NWS   Vital Signs:  Patient profile:   77 year old male Height:      74 inches Weight:      188 pounds BMI:     24.23 O2 Sat:      97 % on Room air Temp:     97.0 degrees F oral Pulse rate:   65 / minute BP sitting:   94 / 58  (left arm) Cuff size:   regular  Vitals Entered By: Bill Salinas CMA (December 01, 2009 1:11 PM)  O2 Flow:  Room air CC: pt c/o burning with urination x about 3 months, he has temporarly stopped taking magnesium, calcium,potassium   Primary Care Provider:  Sinclaire Artiga  CC:  pt c/o burning with urination x about 3 months, he has temporarly stopped taking magnesium, calcium, and potassium.  History of Present Illness: Persistent dysuria for 3 months. He reduced APAP and magnesium and thought it was getting better. He has been hydrating, drinking cranberry juice. But, the symptoms persist. No Fever/chills, no low back pain, no pain in the groin, no pain in the perineum.  Current Medications (verified): 1)  Aspirin Low Dose 81 Mg  Tabs (Aspirin) .... Once Daily 2)  Benazepril Hcl 20 Mg Tabs (Benazepril Hcl) .... Take 1 Tablet By Mouth Once A Day 3)  Coumadin 5 Mg  Tabs (Warfarin Sodium) .... Use As Directed 4)  Multivitamins   Caps (Multiple Vitamin) .... Once Daily 5)  Tylenol Extra Strength 500 Mg  Tabs (Acetaminophen) .... Take 2 Tablet By Mouth Two Times A Day 6)  Simvastatin 40 Mg Tabs (Simvastatin) .... Take 1/2 Tablet By Mouth Once A Day 7)  Tikosyn 125 Mcg Caps (Dofetilide) .... Take 1 Tablet By Mouth Two Times A Day 8)  Nitrostat 0.4 Mg Subl (Nitroglycerin) .... As Needed 9)  Voltaren 1 % Gel (Diclofenac Sodium) .... Apply 4 Grams To Affected Area Two Times A Day 10)  Furosemide 40 Mg Tabs (Furosemide) .... Take 1 Tablet By Mouth Once A Day 11)  Klor-Con M10 10 Meq Cr-Tabs (Potassium Chloride Crys Cr) .... Take 1 Tablet By Mouth Once A Day 12)  Fish Oil 1000 Mg Caps (Omega-3 Fatty Acids) .... Take 1 Tablet By Mouth  Once A Day 13)  Magnesium Oxide 500 Mg Tabs (Magnesium Oxide) .... Take 1 Tablet By Mouth Once A Day 14)  Calcium Carbonate-Vitamin D 600-400 Mg-Unit  Tabs (Calcium Carbonate-Vitamin D) .... Once Daily 15)  Potassium Gluconate 550 Mg Tabs (Potassium Gluconate) .... Once Daily 16)  Flaxseed Oil 1200 Mg Caps (Flaxseed (Linseed)) .... Take 1 Capsule By Mouth Daily  Allergies (verified): 1)  ! 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.9) UNSPECIFIED HEARING LOSS (ICD-389.9) NECK PAIN, CHRONIC (ICD-723.1) * UCD ISCHEMIC COLITIS, HX OF (ICD-V12.79) CEREBROVASCULAR ACCIDENT, HX OF (ICD-V12.50)  Past Surgical History: Last updated: 09/01/2009 Colectomy (07/15/1996) Colostomy (07/15/1996) take down of colostomy 01/14/1997 Coronary artery bypass graft (03/22/1989) angioplasty w/ stent RCA 2001 RF Ablation 2004/11/03 AAA stent 4/01, endovascular relining of graft 3/11 AICD '06  Family History: Last updated: 04-Nov-2007 father - deceased @ 85: CAD/MI x 6 mother - deceased @ 88: CVA Neg- prostate or colon cancer; DM Brother -  CAD Sister - RA  Social History: Last updated: 06/06/2008 [employer] - Counselling psychologist married '52- 48 years, widowed; married '02 1 son - '55; 2 daughters -'27, '63; 8 grandchildren 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 Tobacco Use - No.  Alcohol Use - no Drug Use - no  Review of Systems  The patient denies anorexia, fever, weight loss, weight gain, decreased  hearing, chest pain, peripheral edema, headaches, abdominal pain, severe indigestion/heartburn, muscle weakness, difficulty walking, depression, enlarged lymph nodes, and angioedema.    Physical Exam  General:  alert, well-developed, and well-nourished.   Head:  normocephalic and no abnormalities observed.   Neck:  supple and full ROM.   Lungs:  normal respiratory effort and normal breath sounds.   Heart:  normal rate and regular rhythm.   Abdomen:  tender to deep palpation at the level of the umbilicus L>R. No tenderness in the groins. Msk:  No CVAT  Neurologic:  alert & oriented X3, cranial nerves II-XII intact, strength normal in all extremities, and gait normal.   Skin:  turgor normal and color normal.   Psych:  Oriented X3 and memory intact for recent and remote.     Impression & Recommendations:  Problem # 1:  PROSTATITIS, CHRONIC (ICD-601.1) Persistent dysuria abut no fever or other symptoms. U/A positive for blood and LE  Plan - TMP/SMX DS two times a day x 14  Complete Medication List: 1)  Aspirin Low Dose 81 Mg Tabs (Aspirin) .... Once daily 2)  Benazepril Hcl 20 Mg Tabs (Benazepril hcl) .... Take 1 tablet by mouth once a day 3)  Coumadin 5 Mg Tabs (Warfarin sodium) .... Use as directed 4)  Multivitamins Caps (Multiple vitamin) .... Once daily 5)  Tylenol Extra Strength 500 Mg Tabs (Acetaminophen) .... Take 2 tablet by mouth two times a day 6)  Simvastatin 40 Mg Tabs (Simvastatin) .... Take 1/2 tablet by mouth once a day 7)  Tikosyn 125 Mcg Caps (Dofetilide) .... Take 1 tablet by mouth two times a day 8)  Nitrostat 0.4 Mg Subl (Nitroglycerin) .... As needed 9)  Voltaren 1 % Gel (Diclofenac sodium) .... Apply 4 grams to affected area two times a day 10)  Furosemide 40 Mg Tabs (Furosemide) .... Take 1 tablet by mouth once a day 11)  Klor-con M10 10 Meq Cr-tabs (Potassium chloride crys cr) .... Take 1 tablet by mouth once a day 12)  Fish Oil 1000 Mg Caps (Omega-3 fatty  acids) .... Take 1 tablet by mouth once a day 13)  Magnesium Oxide 500 Mg Tabs (Magnesium oxide) .... Take 1 tablet by mouth once a day 14)  Calcium Carbonate-vitamin D 600-400 Mg-unit Tabs (Calcium carbonate-vitamin d) .... Once daily 15)  Potassium Gluconate 550 Mg Tabs (Potassium gluconate) .... Once daily 16)  Flaxseed Oil 1200 Mg Caps (Flaxseed (linseed)) .... Take 1 capsule by mouth daily 17)  Sulfamethoxazole-tmp Ds 800-160 Mg Tabs (Sulfamethoxazole-trimethoprim) .Marland Kitchen.. 1 by mouth two times a day x 14 days for prostatitis Prescriptions: SULFAMETHOXAZOLE-TMP DS 800-160 MG TABS (SULFAMETHOXAZOLE-TRIMETHOPRIM) 1 by mouth two times a day x 14 days for prostatitis  #28 x 1   Entered and Authorized by:   Jacques Navy MD   Signed by:   Jacques Navy MD on 12/01/2009   Method used:   Electronically to        [facility]  [address]  [phone]* (retail)       [address] [place]  [place], [place]  [address]       Ph: [phone] or [phone]       Fax: [phone]   RxID:   [id number]    Orders Added: 1)  Est. Patient Level III [99213]    Laboratory Results   Urine Tests   Date/Time Reported: Ami Bullins CMA  December 01, 2009 1:17 PM   Routine Urinalysis   Color: yellow Appearance: Clear Glucose: negative   (Normal Range: Negative) Bilirubin: negative   (Normal Range: Negative) Ketone: negative   (Normal Range: Negative) Spec. Gravity: 1.020   (Normal Range: 1.003-1.035) Blood: small   (Normal Range: Negative) pH: 5.0   (Normal Range: 5.0-8.0) Protein: negative   (Normal Range: Negative) Urobilinogen: 0.2   (Normal Range: 0-1) Nitrite: negative   (Normal Range: Negative) Leukocyte Esterace: moderate   (Normal Range: Negative)        Appended Document: BURNING SENSATION WHEN URINATES/NWS due to drug interaction with tikosyn cannot use septra or quinolones. Will substitute doxycycline 100mg  two times a day x 14 days. Pharmacy notified.

## 2010-03-16 NOTE — Medication Information (Signed)
Summary: ccr/ gd      Allergies Added:  Anticoagulant Therapy  Managed by: Reina Fuse, PharmD Referring MD: Sherryl Manges MD PCP: Link Snuffer MD: Gala Romney MD, Reuel Boom Indication 1: Atrial Fibrillation (ICD-427.31) Indication 2: TIA (ICD-435.9) Lab Used: [facility] Site: [facility] INR POC 2.1 INR RANGE 2 - 3  Dietary changes: no    Health status changes: yes       Details: chills, not feeling well last Thurs, but feels better today  Bleeding/hemorrhagic complications: no    Recent/future hospitalizations: no    Any changes in medication regimen? no    Recent/future dental: no  Any missed doses?: no       Is patient compliant with meds? yes       Current Medications (verified): 1)  Aspirin Low Dose 81 Mg  Tabs (Aspirin) .... Once Daily 2)  Benazepril Hcl 20 Mg Tabs (Benazepril Hcl) .... Take 1 Tablet By Mouth Once A Day 3)  Coumadin 5 Mg  Tabs (Warfarin Sodium) .... Use As Directed 4)  Multivitamins   Caps (Multiple Vitamin) .... Once Daily 5)  Tylenol Extra Strength 500 Mg  Tabs (Acetaminophen) .... Take 2 Tablet By Mouth Two Times A Day 6)  Simvastatin 40 Mg Tabs (Simvastatin) .... Take 1/2 Tablet By Mouth Once A Day 7)  Tikosyn 125 Mcg Caps (Dofetilide) .... Take 1 Tablet By Mouth Two Times A Day 8)  Nitrostat 0.4 Mg Subl (Nitroglycerin) .... As Needed 9)  Voltaren 1 % Gel (Diclofenac Sodium) .... Apply 4 Grams To Affected Area Two Times A Day 10)  Furosemide 40 Mg Tabs (Furosemide) .... Take 1 Tablet By Mouth Once A Day 11)  Klor-Con M10 10 Meq Cr-Tabs (Potassium Chloride Crys Cr) .... Take 1 Tablet By Mouth Once A Day 12)  Fish Oil 1000 Mg Caps (Omega-3 Fatty Acids) .... Take 1 Tablet By Mouth Once A Day 13)  Magnesium Oxide 500 Mg Tabs (Magnesium Oxide) .... Take 1 Tablet By Mouth Once A Day 14)  Calcium Carbonate-Vitamin D 600-400 Mg-Unit  Tabs (Calcium Carbonate-Vitamin D) .... Once Daily 15)  Potassium Gluconate 550 Mg Tabs (Potassium Gluconate) ....  Once Daily 16)  Flaxseed Oil 1200 Mg Caps (Flaxseed (Linseed)) .... Take 1 Capsule By Mouth Daily  Allergies (verified): 1)  ! Phenergan  Anticoagulation Management History:      The patient is taking warfarin and comes in today for a routine follow up visit.  Positive risk factors for bleeding include an age of 52 years or older and presence of serious comorbidities.  The bleeding index is 'intermediate risk'.  Positive CHADS2 values include History of CHF and Age > 53 years old.  The start date was 07/15/2004.  His last INR was 2.4 RATIO.  Anticoagulation responsible provider: Bensimhon MD, Reuel Boom.  INR POC: 2.1.  Cuvette Lot#: [id number].  Exp: 11/2010.    Anticoagulation Management Assessment/Plan:      The patient's current anticoagulation dose is Coumadin 5 mg  tabs: use as directed.  The target INR is 2 - 3.  The next INR is due 12/08/2009.  Anticoagulation instructions were given to patient.  Results were reviewed/authorized by Reina Fuse, PharmD.  He was notified by Reina Fuse PharmD.         Prior Anticoagulation Instructions: INR 3.2 Hold your dose today. Keep your dosing schedule the same. You've been stable at that dose for a long time. We'll see you in 2 weeks to make sure you're back down.  Current  Anticoagulation Instructions: INR 2.1  Continue taking Coumadin 1 tab (5 mg) on all days except Coumadin 0.5 tab (2.5 mg) on Fridays.  Return to clinic in 3 weeks.

## 2010-03-16 NOTE — Assessment & Plan Note (Signed)
Summary: E4V      Allergies Added:   Visit Type:  6 month follow up Primary Provider:  Norins  CC:  no complaints.  History of Present Illness: [name] is seen in followup for ischemic heart disease with prior bypass surgery and history of atrial fibrillation. He is status post ICD implantation for primary prevention. He is approaching ERI. He ahs a T135 in place  Is intercurrently undergone stent graft repair of his aortic stent graft  K in Jully was 5.3 Mg not checked   The patient denies SOB, chest pain, edema or palpitations;  he says the more he does the better he feels; he is even losing weight   Current Medications (verified): 1)  Aspirin Low Dose 81 Mg  Tabs (Aspirin) .... Once Daily 2)  Benazepril Hcl 20 Mg Tabs (Benazepril Hcl) .... Take 1 Tablet By Mouth Once A Day 3)  Coumadin 5 Mg  Tabs (Warfarin Sodium) .... Use As Directed 4)  Multivitamins   Caps (Multiple Vitamin) .... Once Daily 5)  Tylenol Extra Strength 500 Mg  Tabs (Acetaminophen) .... Take 2 Tablet By Mouth Two Times A Day 6)  Simvastatin 40 Mg Tabs (Simvastatin) .... Take 1/2 Tablet By Mouth Once A Day 7)  Tikosyn 125 Mcg Caps (Dofetilide) .... Take 1 Tablet By Mouth Two Times A Day 8)  Nitrostat 0.4 Mg Subl (Nitroglycerin) .... As Needed 9)  Voltaren 1 % Gel (Diclofenac Sodium) .... Apply 4 Grams To Affected Area Two Times A Day 10)  Furosemide 40 Mg Tabs (Furosemide) .... Take 1 Tablet By Mouth Once A Day 11)  Klor-Con M10 10 Meq Cr-Tabs (Potassium Chloride Crys Cr) .... Take 1 Tablet By Mouth Once A Day 12)  Fish Oil 1000 Mg Caps (Omega-3 Fatty Acids) .... Take 1 Tablet By Mouth Once A Day 13)  Magnesium Oxide 500 Mg Tabs (Magnesium Oxide) .... Take 1 Tablet By Mouth Once A Day 14)  Calcium Carbonate-Vitamin D 600-400 Mg-Unit  Tabs (Calcium Carbonate-Vitamin D) .... Once Daily 15)  Potassium Gluconate 550 Mg Tabs (Potassium Gluconate) .... Once Daily 16)  Flaxseed Oil 1200 Mg Caps (Flaxseed (Linseed))  .... Take 1 Capsule By Mouth Daily 17)  Sulfamethoxazole-Tmp Ds 800-160 Mg Tabs (Sulfamethoxazole-Trimethoprim) .Marland Kitchen.. 1 By Mouth Two Times A Day X 14 Days For Prostatitis  Allergies (verified): 1)  ! Phenergan  Vital Signs:  Patient profile:   77 year old male Height:      74 inches Weight:      188.50 pounds BMI:     24.29 Pulse rate:   65 / minute BP sitting:   94 / 58  (left arm) Cuff size:   regular  Vitals Entered By: Caralee Ates CMA (December 21, 2009 1:34 PM)  Physical Exam  General:  The patient was alert and oriented in no acute distress. HEENT Normal.  Neck veins were flat, carotids were brisk.  Lungs were clear.  Heart sounds were regular without murmurs or gallops.  Abdomen was soft with active bowel sounds. There is no clubbing cyanosis or edema. Skin Warm and dry    EKG  Procedure date:  12/21/2009  Findings:      sinus@ 66 .21/.13/.43 spetal infacrt  and possible lateral infarct sxis leftward LVH    ICD Specifications Following MD:  Sherryl Manges, MD      Huston Foley Parameters Mode VVI     Lower Rate Limit:  40      Tachy Zones VF:  240  VT:  200     VT1:  160     Impression & Recommendations:  Problem # 1:  ATRIAL FIBRILLATION (ICD-427.31) Holding sinus on Tikosyn  will recheck labs in jan His updated medication list for this problem includes:    Aspirin Low Dose 81 Mg Tabs (Aspirin) ..... Once daily    Coumadin 5 Mg Tabs (Warfarin sodium) ..... Use as directed    Tikosyn 125 Mcg Caps (Dofetilide) .Marland Kitchen... Take 1 tablet by mouth two times a day  Problem # 2:  CARDIOMYOPATHY, ISCHEMIC, EF 20-30% (ICD-414.8) doing great. continue current meds His updated medication list for this problem includes:    Aspirin Low Dose 81 Mg Tabs (Aspirin) ..... Once daily    Benazepril Hcl 20 Mg Tabs (Benazepril hcl) .Marland Kitchen... Take 1 tablet by mouth once a day    Coumadin 5 Mg Tabs (Warfarin sodium) ..... Use as directed    Tikosyn 125 Mcg Caps (Dofetilide) .Marland Kitchen... Take 1  tablet by mouth two times a day    Nitrostat 0.4 Mg Subl (Nitroglycerin) .Marland Kitchen... As needed    Furosemide 40 Mg Tabs (Furosemide) .Marland Kitchen... Take 1 tablet by mouth once a day  Problem # 3:  SYSTOLIC HEART FAILURE, CHRONIC (ICD-428.22) stable  His updated medication list for this problem includes:    Aspirin Low Dose 81 Mg Tabs (Aspirin) ..... Once daily    Benazepril Hcl 20 Mg Tabs (Benazepril hcl) .Marland Kitchen... Take 1 tablet by mouth once a day    Coumadin 5 Mg Tabs (Warfarin sodium) ..... Use as directed    Tikosyn 125 Mcg Caps (Dofetilide) .Marland Kitchen... Take 1 tablet by mouth two times a day    Nitrostat 0.4 Mg Subl (Nitroglycerin) .Marland Kitchen... As needed    Furosemide 40 Mg Tabs (Furosemide) .Marland Kitchen... Take 1 tablet by mouth once a day  Problem # 4:  ICD - IN SITU (ICD-V45.02) Device parameters and data were reviewed and no changes were made  Patient Instructions: 1)  Your physician recommends that you continue on your current medications as directed. Please refer to the Current Medication list given to you today. 2)  Your physician recommends that you return for lab work in: Feb 23, 2010 at 8:30am for BMET and Mag.

## 2010-03-16 NOTE — Medication Information (Signed)
Summary: rov coumadin - [facility]  Anticoagulant Therapy  Managed by: Cloyde Reams, RN, BSN Referring MD: Sherryl Manges MD PCP: Link Snuffer MD: Daleen Squibb MD, Maisie Fus Indication 1: Atrial Fibrillation (ICD-427.31) Indication 2: TIA (ICD-435.9) Lab Used: [facility] Site: [facility] INR POC 1.6 INR RANGE 2 - 3  Dietary changes: no    Health status changes: no    Bleeding/hemorrhagic complications: no    Recent/future hospitalizations: no    Any changes in medication regimen? no    Recent/future dental: no  Any missed doses?: yes     Details: Off coumadin prior to procedure, resumed coumadin 04/02/09 and Lovenox post procedure.  5mg  daily since 04/02/09  Is patient compliant with meds? yes       Allergies (verified): 1)  ! Phenergan  Anticoagulation Management History:      The patient is taking warfarin and comes in today for a routine follow up visit.  Positive risk factors for bleeding include an age of 75 years or older.  The bleeding index is 'intermediate risk'.  Positive CHADS2 values include History of CHF and Age > 71 years old.  The start date was 07/15/2004.  His last INR was 2.4 RATIO.  Anticoagulation responsible provider: Daleen Squibb MD, Maisie Fus.  INR POC: 1.6.  Cuvette Lot#: [id number].  Exp: 05/2010.    Anticoagulation Management Assessment/Plan:      The patient's current anticoagulation dose is Coumadin 5 mg  tabs: use as directed.  The target INR is 2 - 3.  The next INR is due 04/09/2009.  Anticoagulation instructions were given to patient.  Results were reviewed/authorized by Cloyde Reams, RN, BSN.  He was notified by Cloyde Reams RN.         Prior Anticoagulation Instructions: INR 2.9  Coumadin 1 tab = 5mg  each day except 1/2 tab = 2.5mg  on Fri  Current Anticoagulation Instructions: INR 1.6  Take 1.5 tablets today then resume 5mg  daily as well as Lovenox injections two times a day.  Recheck on Thursday.

## 2010-03-16 NOTE — Progress Notes (Signed)
Summary: LAB RESULTS  Phone Note Call from Patient Call back at Home Phone [phone]   Summary of Call: Patient is requesting a call regarding the last lab results letter. The letter has different numbers than the labs attached. Does the difference in numbers change any reccomendations?  Initial call taken by: Lamar Sprinkles, CMA,  September 14, 2009 12:06 PM  Follow-up for Phone Call        wrong values imported to letter. The cut and pasted labs are the accurate labs.  Follow-up by: Jacques Navy MD,  September 14, 2009 2:39 PM  Additional Follow-up for Phone Call Additional follow up Details #1::        Pt informed  Additional Follow-up by: Lamar Sprinkles, CMA,  September 15, 2009 3:38 PM

## 2010-03-16 NOTE — Medication Information (Signed)
Summary: rov/sl  Anticoagulant Therapy  Managed by: Cloyde Reams, RN, BSN Referring MD: Sherryl Manges MD PCP: Link Snuffer MD: Shirlee Latch MD, Dalton Indication 1: Atrial Fibrillation (ICD-427.31) Indication 2: TIA (ICD-435.9) Lab Used: [facility] Site: [facility] INR POC 2.7 INR RANGE 2 - 3  Dietary changes: no    Health status changes: no    Bleeding/hemorrhagic complications: no    Recent/future hospitalizations: no    Any changes in medication regimen? yes       Details: Started on Doxycycline 100mg  bid x 14 days, started 3 days ago.    Recent/future dental: no  Any missed doses?: no       Is patient compliant with meds? yes       Allergies: 1)  ! Phenergan  Anticoagulation Management History:      The patient is taking warfarin and comes in today for a routine follow up visit.  Positive risk factors for bleeding include an age of 75 years or older and presence of serious comorbidities.  The bleeding index is 'intermediate risk'.  Positive CHADS2 values include History of CHF and Age > 54 years old.  The start date was 07/15/2004.  His last INR was 2.4 RATIO.  Anticoagulation responsible provider: Shirlee Latch MD, Dalton.  INR POC: 2.7.  Cuvette Lot#: [id number].  Exp: 12/2010.    Anticoagulation Management Assessment/Plan:      The patient's current anticoagulation dose is Coumadin 5 mg  tabs: use as directed.  The target INR is 2 - 3.  The next INR is due 12/17/2009.  Anticoagulation instructions were given to patient.  Results were reviewed/authorized by Cloyde Reams, RN, BSN.  He was notified by Cloyde Reams RN.         Prior Anticoagulation Instructions: INR 2.1  Continue taking Coumadin 1 tab (5 mg) on all days except Coumadin 0.5 tab (2.5 mg) on Fridays.  Return to clinic in 3 weeks.   Current Anticoagulation Instructions: INR 2.7  Continue on same dosage 1 tablet daily except 1/2 tablet on Fridays.  Recheck in 1 week.

## 2010-03-16 NOTE — Medication Information (Signed)
Summary: rov/ewj  Anticoagulant Therapy  Managed by: Cloyde Reams, RN, BSN Referring MD: Sherryl Manges MD PCP: Link Snuffer MD: Daleen Squibb MD, Maisie Fus Indication 1: Atrial Fibrillation (ICD-427.31) Indication 2: TIA (ICD-435.9) Lab Used: [facility] Site: [facility] INR POC 2.6 INR RANGE 2 - 3  Dietary changes: no    Health status changes: yes       Details: Pt has been noticing low blood pressures since taking lovenox.  Says SBP drops down to 80-90s.  Bleeding/hemorrhagic complications: yes       Details: various bruises around abdomen from lovenox injections and around surgical site.  Pt reports they are healing.  Recent/future hospitalizations: no    Any changes in medication regimen? no    Recent/future dental: no  Any missed doses?: no       Is patient compliant with meds? yes      Comments: Had last lovenox injection yesterday.  Took one lovenox injection yesterday vs two injections.  Pt took an extra 1/2 tablet once since last visit bc he did not have a second lovenox inection.  Allergies: 1)  ! Phenergan  Anticoagulation Management History:      The patient is taking warfarin and comes in today for a routine follow up visit.  Positive risk factors for bleeding include an age of 75 years or older.  The bleeding index is 'intermediate risk'.  Positive CHADS2 values include History of CHF and Age > 36 years old.  The start date was 07/15/2004.  His last INR was 2.4 RATIO.  Anticoagulation responsible provider: Daleen Squibb MD, Maisie Fus.  INR POC: 2.6.  Cuvette Lot#: [id number].  Exp: 05/2010.    Anticoagulation Management Assessment/Plan:      The patient's current anticoagulation dose is Coumadin 5 mg  tabs: use as directed.  The target INR is 2 - 3.  The next INR is due 04/16/2009.  Anticoagulation instructions were given to patient.  Results were reviewed/authorized by Cloyde Reams, RN, BSN.  He was notified by Eda Keys.         Prior Anticoagulation Instructions: INR  1.6  Take 1.5 tablets today then resume 5mg  daily as well as Lovenox injections two times a day.  Recheck on Thursday.    Current Anticoagulation Instructions: INR 2.6  Continue current dosing schedule.  Take 1 tablet every day, except take 1/2 tablet on Friday.  Return to clinic in 1 week.

## 2010-03-16 NOTE — Progress Notes (Signed)
Summary: pending surgery  Phone Note Call from Patient   Caller: Patient Call For: Dr. 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  He is seeing a Careers adviser at [facility] Dr. Bennie Pierini, phone # [phone].  Next visit 03/19/09.  He will require surgery at some point in the near future and wants to speak to Dr. Graciela Husbands about this and the need for medication adjustment.  This will obviously require him to come off of  Coumadin.   Initial call taken by: Leota Sauers Pharm.D., BCPS, CPP     Appended Document: pending surgery S/W pt and all he wants is to make Dr. Graciela Husbands aware that all of this is taking place. He is not asking for medication management. I ask him to have his surgeon to contact Dr. Graciela Husbands directly if he needs to discuss medications moving forward.

## 2010-03-16 NOTE — Medication Information (Signed)
Summary: rov/sp  Anticoagulant Therapy  Managed by: Cloyde Reams, RN, BSN Referring MD: Sherryl Manges MD PCP: Link Snuffer MD: Johney Frame MD, Fayrene Fearing Indication 1: Atrial Fibrillation (ICD-427.31) Indication 2: TIA (ICD-435.9) Lab Used: [facility] Site: [facility] INR POC 2.2 INR RANGE 2 - 3  Dietary changes: no    Health status changes: no    Bleeding/hemorrhagic complications: no    Recent/future hospitalizations: no    Any changes in medication regimen? no    Recent/future dental: no  Any missed doses?: no       Is patient compliant with meds? yes       Allergies: 1)  ! Phenergan  Anticoagulation Management History:      The patient is taking warfarin and comes in today for a routine follow up visit.  Positive risk factors for bleeding include an age of 75 years or older and presence of serious comorbidities.  The bleeding index is 'intermediate risk'.  Positive CHADS2 values include History of CHF and Age > 54 years old.  The start date was 07/15/2004.  His last INR was 2.4 RATIO.  Anticoagulation responsible provider: Marquesa Rath MD, Fayrene Fearing.  INR POC: 2.2.  Cuvette Lot#: [id number].  Exp: 10/2010.    Anticoagulation Management Assessment/Plan:      The patient's current anticoagulation dose is Coumadin 5 mg  tabs: use as directed.  The target INR is 2 - 3.  The next INR is due 09/03/2009.  Anticoagulation instructions were given to patient.  Results were reviewed/authorized by Cloyde Reams, RN, BSN.  He was notified by Cloyde Reams RN.         Prior Anticoagulation Instructions: INR 3.0  Continue same dose of 1 tablet every day except 1/2 tablet on Friday   Current Anticoagulation Instructions: INR 2.2  Continue on same dosage 1 tablet daily except 1/2 on Fridays.  Recheck in 4 weeks.

## 2010-03-16 NOTE — Letter (Signed)
Summary: [facility]  [facility]   Imported By: Lester Seibert 09/29/2009 08:38:05  _____________________________________________________________________  External Attachment:    Type:   Image     Comment:   External Document

## 2010-03-16 NOTE — Progress Notes (Signed)
  Phone Note Other Incoming   Request: Send information Summary of Call: Records received from [facility] from Dr. Bennie Pierini. 10 pages forwarded to Dr. Debby Bud for review.

## 2010-03-16 NOTE — Letter (Signed)
Summary: Handout Printed  Printed Handout:  - Coumadin Instructions-w/out Meds 

## 2010-03-16 NOTE — Progress Notes (Signed)
  Phone Note From Pharmacy   Caller: Genia Hotter [facility] [phone] Summary of Call: [facility]ist called stating that they recevied rx on pt for Septra, however Dr. Graciela Husbands also has pt on Tikosyn which causes a level one warning ( Can cause arrhythmias). They would like to know if MD wants to continue with septra or switch to an alternative. Initial call taken by: Rock Nephew CMA,  December 01, 2009 2:12 PM  Follow-up for Phone Call        spoke with pharmacist. they are going to hold off on filling rx until AM similar interaction with cipro defer to Dr. Debby Bud to decide on choice of abx for chronic prostatitis Follow-up by: D. Thomos Lemons DO,  December 01, 2009 5:36 PM  Additional Follow-up for Phone Call Additional follow up Details #1::        will check with Dr. Graciela Husbands about the use of either cipro or septra for the prostatitis with both having a level 1 warning.  Additional Follow-up by: Jacques Navy MD,  December 01, 2009 5:54 PM    Additional Follow-up for Phone Call Additional follow up Details #2::    Casimiro Needle , i would try and find someting different Follow-up by: Nathen May, MD, [facility],  December 01, 2009 6:20 PM

## 2010-03-16 NOTE — Medication Information (Signed)
Summary: rov/sp  Anticoagulant Therapy  Managed by: Bethena Midget, RN, BSN Referring MD: Sherryl Manges MD PCP: Link Snuffer MD: Patty Sermons MD, Maisie Fus Indication 1: Atrial Fibrillation (ICD-427.31) Indication 2: TIA (ICD-435.9) Lab Used: [facility] Site: [facility] INR POC 2.4 INR RANGE 2 - 3  Dietary changes: no    Health status changes: no    Bleeding/hemorrhagic complications: no    Recent/future hospitalizations: no    Any changes in medication regimen? no    Recent/future dental: no  Any missed doses?: no       Is patient compliant with meds? yes       Allergies: 1)  ! Phenergan  Anticoagulation Management History:      The patient is taking warfarin and comes in today for a routine follow up visit.  Positive risk factors for bleeding include an age of 75 years or older and presence of serious comorbidities.  The bleeding index is 'intermediate risk'.  Positive CHADS2 values include History of CHF and Age > 53 years old.  The start date was 07/15/2004.  His last INR was 2.4 RATIO.  Anticoagulation responsible provider: Patty Sermons MD, Maisie Fus.  INR POC: 2.4.  Cuvette Lot#: [id number].  Exp: 01/2011.    Anticoagulation Management Assessment/Plan:      The patient's current anticoagulation dose is Coumadin 5 mg  tabs: use as directed.  The target INR is 2 - 3.  The next INR is due 02/09/2010.  Anticoagulation instructions were given to patient.  Results were reviewed/authorized by Bethena Midget, RN, BSN.  He was notified by Bethena Midget, RN, BSN.         Prior Anticoagulation Instructions: INR 2.8  Continue same dose of 1 tablet every day except 1/2 tablet on Friday.  Recheck INR in 4 weeks.   Current Anticoagulation Instructions: INR 2.4 Continue 5mg s daily except 2.5mg s on Fridays. Recheck in 4 weeks.

## 2010-03-16 NOTE — Procedures (Signed)
Summary: Cardiology Device Clinic    Allergies: 1)  ! Phenergan   ICD Specifications Following MD:  Sherryl Manges, MD      ICD Follow Up Remote Check?  No Battery Voltage:  2.58 V     Charge Time:  11.5 seconds     Battery Est. Longevity:  MOL2   ICD Device Measurements Right Ventricle:  Amplitude: 5.1 mV, Impedance: 530 ohms, Threshold: 0.8 V at 0.4 msec Shock Impedance: 42 ohms   Episodes Coumadin:  Yes Shock:  0     ATP:  0     Nonsustained:  2      Brady Parameters Mode VVI     Lower Rate Limit:  40      Tachy Zones VF:  240     VT:  200     VT1:  160     Next Remote Date:  03/25/2010     Next Cardiology Appt Due:  03/25/2010 Tech Comments:  Checked by [employer].    Latitude transmissions every 3 months.   Altha Harm, LPN  December 21, 2009 3:24 PM

## 2010-03-16 NOTE — Letter (Signed)
Summary: Self Recorded Medical Hx  Self Recorded Medical Hx   Imported By: Marylou Mccoy 08/11/2009 16:43:23  _____________________________________________________________________  External Attachment:    Type:   Image     Comment:   External Document

## 2010-03-16 NOTE — Op Note (Signed)
Summary: [facility]  [facility]   Imported By: Lester [place] 09/14/2009 07:56:16  _____________________________________________________________________  External Attachment:    Type:   Image     Comment:   External Document

## 2010-03-16 NOTE — Op Note (Signed)
Summary: [facility]  [facility]   Imported By: Lester [place] 09/14/2009 07:57:35  _____________________________________________________________________  External Attachment:    Type:   Image     Comment:   External Document

## 2010-03-16 NOTE — Medication Information (Signed)
Summary: rov/eac  Anticoagulant Therapy  Managed by: Shelby Dubin, PharmD, BCPS, CPP Referring MD: Sherryl Manges MD PCP: Link Snuffer MD: Jens Som MD, Arlys Early Indication 1: Atrial Fibrillation (ICD-427.31) Indication 2: TIA (ICD-435.9) Lab Used: [facility] Site: [facility] INR POC 2.4 INR RANGE 2 - 3  Dietary changes: no    Health status changes: no    Bleeding/hemorrhagic complications: no    Recent/future hospitalizations: no    Any changes in medication regimen? no    Recent/future dental: no  Any missed doses?: yes     Details: resumed warfarin 9 days ago s/p graft repair at [facility]  Is patient compliant with meds? yes       Allergies (verified): 1)  ! Phenergan  Anticoagulation Management History:      The patient is taking warfarin and comes in today for a routine follow up visit.  Positive risk factors for bleeding include an age of 75 years or older.  The bleeding index is 'intermediate risk'.  Positive CHADS2 values include History of CHF and Age > 26 years old.  The start date was 07/15/2004.  His last INR was 2.4 RATIO.  Anticoagulation responsible provider: Jens Som MD, Arlys Aamari.  INR POC: 2.4.  Exp: 05/2010.    Anticoagulation Management Assessment/Plan:      The patient's current anticoagulation dose is Coumadin 5 mg  tabs: use as directed.  The target INR is 2 - 3.  The next INR is due 05/14/2009.  Anticoagulation instructions were given to patient.  Results were reviewed/authorized by Shelby Dubin, PharmD, BCPS, CPP.  He was notified by Shelby Dubin PharmD, BCPS, CPP.         Prior Anticoagulation Instructions: INR 2.6  Continue current dosing schedule.  Take 1 tablet every day, except take 1/2 tablet on Friday.  Return to clinic in 1 week.    Current Anticoagulation Instructions: INR 2.4  Continue 1 tab daily except 0.5 tab each Friday.  Recheck in 4 weeks.

## 2010-03-16 NOTE — Medication Information (Signed)
Summary: rov  js  Anticoagulant Therapy  Managed by: Leota Sauers, PharmD, BCPS, CPP Referring MD: Sherryl Manges MD PCP: Link Snuffer MD: Excell Seltzer MD, Casimiro Needle Indication 1: Atrial Fibrillation (ICD-427.31) Indication 2: TIA (ICD-435.9) Lab Used: [facility] Site: [facility] INR POC 2.9 INR RANGE 2 - 3  Dietary changes: no    Health status changes: no    Bleeding/hemorrhagic complications: no    Recent/future hospitalizations: yes       Details: AAA - plan to OR will f/u with timeing with Dr Graciela Husbands  Any changes in medication regimen? no    Recent/future dental: no  Any missed doses?: no       Is patient compliant with meds? yes       Current Medications (verified): 1)  Aspirin Low Dose 81 Mg  Tabs (Aspirin) .... Once Daily 2)  Benazepril Hcl 20 Mg Tabs (Benazepril Hcl) .... Take 1 Tablet By Mouth Once A Day 3)  Coumadin 5 Mg  Tabs (Warfarin Sodium) .... Use As Directed 4)  Multivitamins   Caps (Multiple Vitamin) .... Once Daily 5)  Tylenol Extra Strength 500 Mg  Tabs (Acetaminophen) .... Take 2 Tablet By Mouth Two Times A Day 6)  Simvastatin 40 Mg Tabs (Simvastatin) .... Take 1/2 Tablet By Mouth Once A Day 7)  Tikosyn 125 Mcg Caps (Dofetilide) .... Take 1 Tablet By Mouth Two Times A Day 8)  Nitrostat 0.4 Mg Subl (Nitroglycerin) .... As Needed 9)  Voltaren 1 % Gel (Diclofenac Sodium) .... Apply 4 Grams To Affected Area Two Times A Day 10)  Furosemide 40 Mg Tabs (Furosemide) .... Take 1 Tablet By Mouth Once A Day 11)  Klor-Con M10 10 Meq Cr-Tabs (Potassium Chloride Crys Cr) .... Take 1 Tablet By Mouth Once A Day 12)  Fish Oil 1000 Mg Caps (Omega-3 Fatty Acids) .... Take 1 Tablet By Mouth Once A Day 13)  Magnesium 200 Mg Tabs (Magnesium) .... Once Daily 14)  Calcium Carbonate-Vitamin D 600-400 Mg-Unit  Tabs (Calcium Carbonate-Vitamin D) .... Once Daily 15)  Potassium Gluconate 550 Mg Tabs (Potassium Gluconate) .... Once Daily 16)  Flaxseed Oil 1200 Mg Caps (Flaxseed  (Linseed)) .... Take 1 Capsule By Mouth Daily  Allergies (verified): 1)  ! Phenergan  Anticoagulation Management History:      The patient is taking warfarin and comes in today for a routine follow up visit.  Positive risk factors for bleeding include an age of 76 years or older.  The bleeding index is 'intermediate risk'.  Positive CHADS2 values include History of CHF and Age > 82 years old.  The start date was 07/15/2004.  His last INR was 2.4 RATIO.  Anticoagulation responsible provider: Excell Seltzer MD, Casimiro Needle.  INR POC: 2.9.  Cuvette Lot#: [id number].  Exp: 03/2010.    Anticoagulation Management Assessment/Plan:      The patient's current anticoagulation dose is Coumadin 5 mg  tabs: use as directed.  The target INR is 2 - 3.  The next INR is due 04/07/2009.  Anticoagulation instructions were given to patient.  Results were reviewed/authorized by Leota Sauers, PharmD, BCPS, CPP.         Prior Anticoagulation Instructions: INR = 2.9  The patient is to continue with the same dose of coumadin.  This dosage includes: 1 tablet daily except 1/2 tablet on Fridays.  Recheck in 4 weeks.  Current Anticoagulation Instructions: INR 2.9  Coumadin 1 tab = 5mg  each day except 1/2 tab = 2.5mg  on Fri

## 2010-03-16 NOTE — Assessment & Plan Note (Signed)
Summary: defib check.gdt.amber  Medications Added FUROSEMIDE 40 MG TABS (FUROSEMIDE) Take 1 tablet by mouth once a day KLOR-CON M10 10 MEQ CR-TABS (POTASSIUM CHLORIDE CRYS CR) Take 1 tablet by mouth once a day MAGNESIUM OXIDE 500 MG TABS (MAGNESIUM OXIDE) Take 1 tablet by mouth once a day      Allergies Added:   Primary Provider:  [facility]   History of Present Illness: Mr. [name] is seen in followup for ischemic heart disease with prior bypass surgery and history of atrial fibrillation. He is status post ICD implantation for primary prevention. He is approaching ERI.  Is intercurrently undergone stent graft repair of his aortic stent graft.   The patient denies SOB, chest pain, edema or palpitations   Current Medications (verified): 1)  Aspirin Low Dose 81 Mg  Tabs (Aspirin) .... Once Daily 2)  Benazepril Hcl 20 Mg Tabs (Benazepril Hcl) .... Take 1 Tablet By Mouth Once A Day 3)  Coumadin 5 Mg  Tabs (Warfarin Sodium) .... Use As Directed 4)  Multivitamins   Caps (Multiple Vitamin) .... Once Daily 5)  Tylenol Extra Strength 500 Mg  Tabs (Acetaminophen) .... Take 2 Tablet By Mouth Two Times A Day 6)  Simvastatin 40 Mg Tabs (Simvastatin) .... Take 1/2 Tablet By Mouth Once A Day 7)  Tikosyn 125 Mcg Caps (Dofetilide) .... Take 1 Tablet By Mouth Two Times A Day 8)  Nitrostat 0.4 Mg Subl (Nitroglycerin) .... As Needed 9)  Voltaren 1 % Gel (Diclofenac Sodium) .... Apply 4 Grams To Affected Area Two Times A Day 10)  Furosemide 40 Mg Tabs (Furosemide) .... Take 1 Tablet By Mouth Once A Day 11)  Klor-Con M10 10 Meq Cr-Tabs (Potassium Chloride Crys Cr) .... Take 1 Tablet By Mouth Once A Day 12)  Fish Oil 1000 Mg Caps (Omega-3 Fatty Acids) .... Take 1 Tablet By Mouth Once A Day 13)  Magnesium Oxide 500 Mg Tabs (Magnesium Oxide) .... Take 1 Tablet By Mouth Once A Day 14)  Calcium Carbonate-Vitamin D 600-400 Mg-Unit  Tabs (Calcium Carbonate-Vitamin D) .... Once Daily 15)  Potassium Gluconate 550 Mg  Tabs (Potassium Gluconate) .... Once Daily 16)  Flaxseed Oil 1200 Mg Caps (Flaxseed (Linseed)) .... Take 1 Capsule By Mouth Daily  Allergies (verified): 1)  ! Phenergan  Past History:  Past Surgical History: Last updated: 03/21/2007 Colectomy (07/15/1996) Colostomy (07/15/1996) take down of colostomy 01/14/1997 Coronary artery bypass graft (03/22/1989) angioplasty w/ stent RCA 2001 RF Ablation 2004-10-24 AAA stent 4/01 AICD '06  Family History: Last updated: 2007-10-25 father - deceased @ 67: CAD/MI x 6 mother - deceased @ 53: CVA Neg- prostate or colon cancer; DM Brother - CAD Sister - RA  Social History: Last updated: 06/06/2008 [employer] - Counselling psychologist married '52- 48 years, widowed; married '02 1 son - '55; 2 daughters -'53, '63; 8 grandchildren 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 Tobacco Use - No.  Alcohol Use - no Drug Use - no  Vital Signs:  Patient profile:   77 year old male Height:      74 inches Weight:      192 pounds Pulse rate:   56 / minute Pulse rhythm:   regular BP sitting:   110 / 64  (left arm) Cuff size:   regular  Vitals Entered By: Judithe Modest CMA (Jun 16, 2009 12:14 PM)  Physical Exam  General:  The patient was alert and oriented in no acute distress. HEENT Normal.  Neck veins were flat, carotids were brisk.  Lungs were clear.  Heart sounds were regular without murmurs or gallops.  Abdomen was soft with active bowel sounds. There is no clubbing cyanosis or edema. Skin Warm and dry     ICD Specifications Following MD:  Sherryl Manges, MD      ICD Follow Up Battery Voltage:  2.59 V     Charge Time:  10.5 seconds     Underlying rhythm:  SR   ICD Device Measurements Right Ventricle:  Amplitude: 7.9 mV, Impedance: 530 ohms, Threshold: 0.8 V at 0.4 msec Shock Impedance: 40 ohms   Episodes MS Episodes:  0     Percent Mode Switch:  0     Shock:  0     ATP:  0      Nonsustained:  1     Ventricular Pacing:  0%  Brady Parameters Mode VVI     Lower Rate Limit:  40      Tachy Zones VF:  240     VT:  200     VT1:  160     Next Remote Date:  09/17/2009     Tech Comments:  NST EPISODE INTERPRETED AS ATACH.  NORMAL DEVICE FUNCTION.  NO CHANGES MADE.  LATITUDE CHECK 09-17-09. Vella Kohler  Impression & Recommendations:  Problem # 1:  ATRIAL FIBRILLATION (ICD-427.31)  some nonsustained atrial arrhythmias are noted. Given his recent stent graft repair, we will continue him on aspirin in conjunction with his Coumadin for the time being  on Tikosyn we will plan to check his potassium and magnesium  His updated medication list for this problem includes:    Aspirin Low Dose 81 Mg Tabs (Aspirin) ..... Once daily    Coumadin 5 Mg Tabs (Warfarin sodium) ..... Use as directed    Tikosyn 125 Mcg Caps (Dofetilide) .Marland Kitchen... Take 1 tablet by mouth two times a day  Orders: TLB-BMP (Basic Metabolic Panel-BMET) (80048-METABOL) TLB-Magnesium (Mg) (83735-MG)  Problem # 2:  ABDOMINAL AORTIC ANEURYSM (ICD-441.4) please see the above  Problem # 3:  SYSTOLIC HEART FAILURE, CHRONIC (ICD-428.22)  stable His updated medication list for this problem includes:    Aspirin Low Dose 81 Mg Tabs (Aspirin) ..... Once daily    Benazepril Hcl 20 Mg Tabs (Benazepril hcl) .Marland Kitchen... Take 1 tablet by mouth once a day    Coumadin 5 Mg Tabs (Warfarin sodium) ..... Use as directed    Tikosyn 125 Mcg Caps (Dofetilide) .Marland Kitchen... Take 1 tablet by mouth two times a day    Nitrostat 0.4 Mg Subl (Nitroglycerin) .Marland Kitchen... As needed    Furosemide 40 Mg Tabs (Furosemide) .Marland Kitchen... Take 1 tablet by mouth once a day  His updated medication list for this problem includes:    Aspirin Low Dose 81 Mg Tabs (Aspirin) ..... Once daily    Benazepril Hcl 20 Mg Tabs (Benazepril hcl) .Marland Kitchen... Take 1 tablet by mouth once a day    Coumadin 5 Mg Tabs (Warfarin sodium) ..... Use as directed    Tikosyn 125 Mcg Caps (Dofetilide)  .Marland Kitchen... Take 1 tablet by mouth two times a day    Nitrostat 0.4 Mg Subl (Nitroglycerin) .Marland Kitchen... As needed    Furosemide 40 Mg Tabs (Furosemide) .Marland Kitchen... Take 1 tablet by mouth once a day  Orders: TLB-BMP (Basic Metabolic Panel-BMET) (80048-METABOL) TLB-Magnesium (Mg) (83735-MG)  Problem # 4:  CARDIOMYOPATHY, ISCHEMIC, EF 20-30% (ICD-414.8)  stable on his current medication His updated medication list for this problem includes:  Aspirin Low Dose 81 Mg Tabs (Aspirin) ..... Once daily    Benazepril Hcl 20 Mg Tabs (Benazepril hcl) .Marland Kitchen... Take 1 tablet by mouth once a day    Coumadin 5 Mg Tabs (Warfarin sodium) ..... Use as directed    Tikosyn 125 Mcg Caps (Dofetilide) .Marland Kitchen... Take 1 tablet by mouth two times a day    Nitrostat 0.4 Mg Subl (Nitroglycerin) .Marland Kitchen... As needed    Furosemide 40 Mg Tabs (Furosemide) .Marland Kitchen... Take 1 tablet by mouth once a day  His updated medication list for this problem includes:    Aspirin Low Dose 81 Mg Tabs (Aspirin) ..... Once daily    Benazepril Hcl 20 Mg Tabs (Benazepril hcl) .Marland Kitchen... Take 1 tablet by mouth once a day    Coumadin 5 Mg Tabs (Warfarin sodium) ..... Use as directed    Tikosyn 125 Mcg Caps (Dofetilide) .Marland Kitchen... Take 1 tablet by mouth two times a day    Nitrostat 0.4 Mg Subl (Nitroglycerin) .Marland Kitchen... As needed    Furosemide 40 Mg Tabs (Furosemide) .Marland Kitchen... Take 1 tablet by mouth once a day  Problem # 5:  ICD - IN SITU (ICD-V45.02) Device parameters and data were reviewed and no changes were made  Patient Instructions: 1)  Your physician recommends that you schedule a follow-up appointment in: 6 months with Dr Graciela Husbands 2)  Your physician recommends that you return for lab work today Prescriptions: KLOR-CON M10 10 MEQ CR-TABS (POTASSIUM CHLORIDE CRYS CR) Take 1 tablet by mouth once a day  #90 x 3   Entered by:   Judithe Modest CMA   Authorized by:   Nathen May, MD, [facility]   Signed by:   Judithe Modest CMA on 06/16/2009   Method used:   Electronically to         [facility]  [phone]* (retail)       [address] [place], [place]  [address]       Ph: [phone] or [phone]       Fax: [phone]   RxID:   [id number] FUROSEMIDE 40 MG TABS (FUROSEMIDE) Take 1 tablet by mouth once a day  #90 x 3   Entered by:   Judithe Modest CMA   Authorized by:   Nathen May, MD, [facility]   Signed by:   Judithe Modest CMA on 06/16/2009   Method used:   [facility]ically to        [facility]  [phone]* (retail)       [address] [place], [place]  [address]       Ph: [phone] or [phone]       Fax: [phone]   RxID:   [id number] TIKOSYN 125 MCG CAPS (DOFETILIDE) Take 1 tablet by mouth two times a day  #180 x 0   Entered by:   Judithe Modest CMA   Authorized by:   Nathen May, MD, [facility]   Signed by:   Judithe Modest CMA on 06/16/2009   Method used:   Electronically to        [facility]  [phone]* (retail)       [address] [place], [place]  [address]       Ph: [phone] or [phone]       Fax: [phone]   RxID:   [id number]

## 2010-03-16 NOTE — Assessment & Plan Note (Signed)
Summary: THROAT FREEZES UP AT TIMES (GOING ON FOR 10 YRS) ALSO UTI? /NWS   Vital Signs:  Patient profile:   77 year old male Height:      74 inches Weight:      192 pounds BMI:     24.74 O2 Sat:      97 % on Room air Temp:     97.0 degrees F oral Pulse rate:   63 / minute BP sitting:   96 / 56  (left arm) Cuff size:   regular  Vitals Entered By: Bill Salinas CMA (September 01, 2009 1:07 PM)  O2 Flow:  Room air CC: pt here with several compliants 1. pt c/o joint pain 2. pt c/o esophagus spasms and states they occur mainly after drinking something cold. 3. pt wants to give update on anuerysm/ ab   Primary Care Provider:  Tashawna Thom  CC:  pt here with several compliants 1. pt c/o joint pain 2. pt c/o esophagus spasms and states they occur mainly after drinking something cold. 3. pt wants to give update on anuerysm/ ab.  History of Present Illness: 1.  2-3 months ago had dysuria - thought it might be drugs: he stopped APAP which helped but his joint pain came back.  2. GI - he will have dyphagia of  both liquids and solids. When he has complete dysphagia it will be painful. He denies any solid food dysphagia, h/o GERD, PUD. It sounds like a motility issue rather than obstructive.  3. Patient had relining of his arotic stent placed for an AAA by Dr. Pattricia Boss in [place]. Original endo-graft placed by Dr. Liliane Bade. He had an insert placed endovascularly from a bilateral femoral artery approach.  Current Medications (verified): 1)  Aspirin Low Dose 81 Mg  Tabs (Aspirin) .... Once Daily 2)  Benazepril Hcl 20 Mg Tabs (Benazepril Hcl) .... Take 1 Tablet By Mouth Once A Day 3)  Coumadin 5 Mg  Tabs (Warfarin Sodium) .... Use As Directed 4)  Multivitamins   Caps (Multiple Vitamin) .... Once Daily 5)  Tylenol Extra Strength 500 Mg  Tabs (Acetaminophen) .... Take 2 Tablet By Mouth Two Times A Day 6)  Simvastatin 40 Mg Tabs (Simvastatin) .... Take 1/2 Tablet By Mouth Once A Day 7)  Tikosyn 125 Mcg Caps  (Dofetilide) .... Take 1 Tablet By Mouth Two Times A Day 8)  Nitrostat 0.4 Mg Subl (Nitroglycerin) .... As Needed 9)  Voltaren 1 % Gel (Diclofenac Sodium) .... Apply 4 Grams To Affected Area Two Times A Day 10)  Furosemide 40 Mg Tabs (Furosemide) .... Take 1 Tablet By Mouth Once A Day 11)  Klor-Con M10 10 Meq Cr-Tabs (Potassium Chloride Crys Cr) .... Take 1 Tablet By Mouth Once A Day 12)  Fish Oil 1000 Mg Caps (Omega-3 Fatty Acids) .... Take 1 Tablet By Mouth Once A Day 13)  Magnesium Oxide 500 Mg Tabs (Magnesium Oxide) .... Take 1 Tablet By Mouth Once A Day 14)  Calcium Carbonate-Vitamin D 600-400 Mg-Unit  Tabs (Calcium Carbonate-Vitamin D) .... Once Daily 15)  Potassium Gluconate 550 Mg Tabs (Potassium Gluconate) .... Once Daily 16)  Flaxseed Oil 1200 Mg Caps (Flaxseed (Linseed)) .... Take 1 Capsule By Mouth Daily  Allergies (verified): 1)  ! Phenergan  Past History:  Past Medical History: Last updated: 06/06/2008 ATRIAL FIBRILLATION (ICD-427.31) CORONARY ARTERY DISEASE (ICD-414.00) CARDIOMYOPATHY, ISCHEMIC (ICD-414.8) ABDOMINAL AORTIC ANEURYSM (ICD-441.4) SYSTOLIC HEART FAILURE, ACUTE ON CHRONIC (ICD-428.23) HYPERLIPIDEMIA (ICD-272.4) ICD - IN SITU (ICD-V45.02) PVD (ICD-443.9)  HEMOPTYSIS (ICD-786.3) Hx of PROSTATITIS, ACUTE (ICD-601.0) UNSPECIFIED LABYRINTHITIS (ICD-386.30) ACCIDENTAL FALLS, RECURRENT (ICD-E888.9) UNSPECIFIED HEARING LOSS (ICD-389.9) NECK PAIN, CHRONIC (ICD-723.1) * UCD ISCHEMIC COLITIS, HX OF (ICD-V12.79) CEREBROVASCULAR ACCIDENT, HX OF (ICD-V12.50)  Family History: Last updated: 11/07/07 father - deceased @ 64: CAD/MI x 6 mother - deceased @ 70: CVA Neg- prostate or colon cancer; DM Brother - CAD Sister - RA  Social History: Last updated: 06/06/2008 [employer] - Counselling psychologist married '52- 48 years, widowed; married '02 1 son - '55; 2 daughters -'64, '83; 8 grandchildren 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 Tobacco Use - No.  Alcohol Use - no Drug Use - no  Risk Factors: Caffeine Use: 2 (11-07-07) Exercise: yes (11/07/2007)  Risk Factors: Smoking Status: never (06/06/2008)  Past Surgical History: Colectomy (07/15/1996) Colostomy (07/15/1996) take down of colostomy 01/14/1997 Coronary artery bypass graft (03/22/1989) angioplasty w/ stent RCA 2001 RF Ablation 06-Nov-2004 AAA stent 4/01, endovascular relining of graft 3/11 AICD '06  Review of Systems  The patient denies anorexia, fever, weight loss, vision loss, decreased hearing, hoarseness, dyspnea on exertion, peripheral edema, headaches, hemoptysis, abdominal pain, severe indigestion/heartburn, muscle weakness, difficulty walking, depression, and enlarged lymph nodes.    Physical Exam  General:  WNWD white male looking younger than his stated Head:  normocephalic and atraumatic.   Eyes:  vision grossly intact, pupils equal, and pupils round.   Neck:  supple.   Chest Wall:  No deformities except for mild kyphosis, masses, tenderness or gynecomastia noted. Lungs:  normal respiratory effort, no intercostal retractions, no accessory muscle use, no crackles, and no wheezes.   Heart:  normal rate and regular rhythm.  II/VI systolic murmur heard best at RSB. Abdomen:  soft, non-tender, normal bowel sounds, and no guarding.  No pulsatile mass Msk:  normal ROM, no joint tenderness, no joint swelling, no redness over joints, and no joint deformities.   Pulses:  2+ radial Neurologic:  alert & oriented X3, cranial nerves II-XII intact, gait normal, and DTRs symmetrical and normal.   Skin:  turgor normal, no rashes, no suspicious lesions, and no ulcerations.   Psych:  Oriented X3, memory intact for recent and remote, normally interactive, and good eye contact.     Impression & Recommendations:  Problem # 1:  DYSPHAGIA UNSPECIFIED (ICD-787.20) Patient with intermittent episodes of "paralysis of  swallowing." Denies solid food dysphagia or sensation of obstruction. Concerned for motility disorder.  Plan - barium swallow to evaluate paristalsis.  Orders: Radiology Referral (Radiology)  Complete Medication List: 1)  Aspirin Low Dose 81 Mg Tabs (Aspirin) .... Once daily 2)  Benazepril Hcl 20 Mg Tabs (Benazepril hcl) .... Take 1 tablet by mouth once a day 3)  Coumadin 5 Mg Tabs (Warfarin sodium) .... Use as directed 4)  Multivitamins Caps (Multiple vitamin) .... Once daily 5)  Tylenol Extra Strength 500 Mg Tabs (Acetaminophen) .... Take 2 tablet by mouth two times a day 6)  Simvastatin 40 Mg Tabs (Simvastatin) .... Take 1/2 tablet by mouth once a day 7)  Tikosyn 125 Mcg Caps (Dofetilide) .... Take 1 tablet by mouth two times a day 8)  Nitrostat 0.4 Mg Subl (Nitroglycerin) .... As needed 9)  Voltaren 1 % Gel (Diclofenac sodium) .... Apply 4 grams to affected area two times a day 10)  Furosemide 40 Mg Tabs (Furosemide) .... Take 1 tablet by mouth once a day 11)  Klor-con M10 10 Meq Cr-tabs (Potassium chloride crys cr) .... Take  1 tablet by mouth once a day 12)  Fish Oil 1000 Mg Caps (Omega-3 fatty acids) .... Take 1 tablet by mouth once a day 13)  Magnesium Oxide 500 Mg Tabs (Magnesium oxide) .... Take 1 tablet by mouth once a day 14)  Calcium Carbonate-vitamin D 600-400 Mg-unit Tabs (Calcium carbonate-vitamin d) .... Once daily 15)  Potassium Gluconate 550 Mg Tabs (Potassium gluconate) .... Once daily 16)  Flaxseed Oil 1200 Mg Caps (Flaxseed (linseed)) .... Take 1 capsule by mouth daily Prescriptions: SIMVASTATIN 40 MG TABS (SIMVASTATIN) Take 1/2 tablet by mouth once a day  #45 Tablet x 12   Entered and Authorized by:   Jacques Navy MD   Signed by:   Jacques Navy MD on 09/01/2009   Method used:   Elect[facility] to        [facility]  [phone]* (retail)       [address] [place], [place]  [address]       Ph: [phone] or [phone]       Fax: [phone]    RxID:   [id number] COUMADIN 5 MG  TABS (WARFARIN SODIUM) use as directed  #30 Tablet x 12   Entered and Authorized by:   Jacques Navy MD   Signed by:   Jacques Navy MD on 09/01/2009   Method used:   Electronically to        [facility]  [phone]* (retail)       [address] [place], [place]  [address]       Ph: [phone] or [phone]       Fax: [phone]   RxID:   [id number]

## 2010-03-16 NOTE — Medication Information (Signed)
Summary: rov/ewj  Anticoagulant Therapy  Managed by: Bethena Midget, RN, BSN Referring MD: Sherryl Manges MD PCP: Link Snuffer MD: Myrtis Ser MD, Tinnie Gens Indication 1: Atrial Fibrillation (ICD-427.31) Indication 2: TIA (ICD-435.9) Lab Used: [facility] Site: [facility] INR POC 2.2 INR RANGE 2 - 3  Dietary changes: no    Health status changes: no    Bleeding/hemorrhagic complications: no    Recent/future hospitalizations: no    Any changes in medication regimen? no    Recent/future dental: no  Any missed doses?: no       Is patient compliant with meds? yes       Allergies: 1)  ! Phenergan  Anticoagulation Management History:      The patient is taking warfarin and comes in today for a routine follow up visit.  Positive risk factors for bleeding include an age of 75 years or older and presence of serious comorbidities.  The bleeding index is 'intermediate risk'.  Positive CHADS2 values include History of CHF and Age > 51 years old.  The start date was 07/15/2004.  His last INR was 2.4 RATIO.  Anticoagulation responsible provider: Myrtis Ser MD, Tinnie Gens.  INR POC: 2.2.  Cuvette Lot#: [id number].  Exp: 11/2010.    Anticoagulation Management Assessment/Plan:      The patient's current anticoagulation dose is Coumadin 5 mg  tabs: use as directed.  The target INR is 2 - 3.  The next INR is due 10/01/2009.  Anticoagulation instructions were given to patient.  Results were reviewed/authorized by Bethena Midget, RN, BSN.  He was notified by Bethena Midget, RN, BSN.         Prior Anticoagulation Instructions: INR 2.2  Continue on same dosage 1 tablet daily except 1/2 on Fridays.  Recheck in 4 weeks.    Current Anticoagulation Instructions: INR 2.2 Continue 5mg s daily except 2.5mg s on Fridays Recheck in 4 weeks.

## 2010-03-16 NOTE — Medication Information (Signed)
Summary: rov/tm  Anticoagulant Therapy  Managed by: Bethena Midget, RN, BSN Referring MD: Sherryl Manges MD PCP: Link Snuffer MD: Excell Seltzer MD, Casimiro Needle Indication 1: Atrial Fibrillation (ICD-427.31) Indication 2: TIA (ICD-435.9) Lab Used: [facility] Site: [facility] INR POC 2.7 INR RANGE 2 - 3  Dietary changes: no    Health status changes: no    Bleeding/hemorrhagic complications: no    Recent/future hospitalizations: no    Any changes in medication regimen? no    Recent/future dental: no  Any missed doses?: no       Is patient compliant with meds? yes       Allergies: 1)  ! Phenergan  Anticoagulation Management History:      The patient is taking warfarin and comes in today for a routine follow up visit.  Positive risk factors for bleeding include an age of 75 years or older and presence of serious comorbidities.  The bleeding index is 'intermediate risk'.  Positive CHADS2 values include History of CHF and Age > 7 years old.  The start date was 07/15/2004.  His last INR was 2.4 RATIO.  Anticoagulation responsible provider: Excell Seltzer MD, Casimiro Needle.  INR POC: 2.7.  Cuvette Lot#: [id number].  Exp: 11/2010.    Anticoagulation Management Assessment/Plan:      The patient's current anticoagulation dose is Coumadin 5 mg  tabs: use as directed.  The target INR is 2 - 3.  The next INR is due 10/29/2009.  Anticoagulation instructions were given to patient.  Results were reviewed/authorized by Bethena Midget, RN, BSN.  He was notified by Bethena Midget, RN, BSN.         Prior Anticoagulation Instructions: INR 2.2 Continue 5mg s daily except 2.5mg s on Fridays Recheck in 4 weeks.   Current Anticoagulation Instructions: INR 2.7 Continue 5mg s everyday except 2.5mg s on Fridays. Recheck in 4 weeks.

## 2010-03-16 NOTE — Medication Information (Signed)
Summary: rov/ewj  Anticoagulant Therapy  Managed by: Weston Brass, PharmD Referring MD: Sherryl Manges MD PCP: Link Snuffer MD: Daleen Squibb MD, Maisie Fus Indication 1: Atrial Fibrillation (ICD-427.31) Indication 2: TIA (ICD-435.9) Lab Used: [facility] Site: [facility] INR POC 2.8 INR RANGE 2 - 3  Dietary changes: no    Health status changes: no    Bleeding/hemorrhagic complications: no    Recent/future hospitalizations: no    Any changes in medication regimen? yes       Details: finished doxycycline 14 day course 2 days ago  Recent/future dental: no  Any missed doses?: no       Is patient compliant with meds? yes       Allergies: 1)  ! Phenergan  Anticoagulation Management History:      The patient is taking warfarin and comes in today for a routine follow up visit.  Positive risk factors for bleeding include an age of 75 years or older and presence of serious comorbidities.  The bleeding index is 'intermediate risk'.  Positive CHADS2 values include History of CHF and Age > 68 years old.  The start date was 07/15/2004.  His last INR was 2.4 RATIO.  Anticoagulation responsible provider: Daleen Squibb MD, Maisie Fus.  INR POC: 2.8.  Cuvette Lot#: [id number].  Exp: 01/2011.    Anticoagulation Management Assessment/Plan:      The patient's current anticoagulation dose is Coumadin 5 mg  tabs: use as directed.  The target INR is 2 - 3.  The next INR is due 01/12/2010.  Anticoagulation instructions were given to patient.  Results were reviewed/authorized by Weston Brass, PharmD.  He was notified by Weston Brass, PharmD.         Prior Anticoagulation Instructions: INR 2.7  Continue on same dosage 1 tablet daily except 1/2 tablet on Fridays.  Recheck in 1 week.   Current Anticoagulation Instructions: INR 2.8  Continue same dose of 1 tablet every day except 1/2 tablet on Friday.  Recheck INR in 4 weeks.

## 2010-03-16 NOTE — Letter (Signed)
[facility] [address] [place], [place]  [address] Phone: [phone]      September 05, 2009   [facility] [address] [place], [place] [address]  RE:  LAB RESULTS  Dear  [name],  The following is an interpretation of your most recent lab tests.  Please take note of any instructions provided or changes to medications that have resulted from your lab work.  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 See report.   Labs are OK . If the "throat freeze" becomes more bothersome we can try a drug called metoclopromide, but if we can avoid another drug that would be a good thing.    Sincerely Yours,    [name] Navy MD  Patient: [name] Note: All result statuses are Final unless otherwise noted.  Tests: (1) BMP (METABOL)   Sodium                    139 mEq/L                   135-145   Potassium            [H]  5.3 mEq/L                   3.5-5.1   Chloride                  108 mEq/L                   96-112   Carbon Dioxide            30 mEq/L                    19-32   Glucose                   82 mg/dL                    66-44   BUN                  [H]  30 mg/dL                    0-34   Creatinine                1.5 mg/dL                   7.4-2.5   Calcium                   8.9 mg/dL                   9.5-63.8   GFR                       46.62 mL/min                >60  Tests: (2) Lipid Panel (LIPID)   Cholesterol               143 mg/dL  0-200     ATP III Classification            Desirable:  < 200 mg/dL                    Borderline High:  200 - 239 mg/dL               High:  > = 240 mg/dL   Triglycerides             120.0 mg/dL                  1.6-109.6     Normal:  <150 mg/dL     Borderline High:  045 - 199 mg/dL   HDL                  [L]  40.98 mg/dL                 >11.91   VLDL Cholesterol          24.0 mg/dL                  4.7-82.9   LDL Cholesterol           86 mg/dL                    5-62  CHO/HDL Ratio:  CHD Risk                             4                    Men          Women     1/2 Average Risk     3.4          3.3     Average Risk          5.0          4.4     2X Average Risk          9.6          7.1     3X Average Risk          15.0          11.0                           Tests: (3) Hepatic/Liver Function Panel (HEPATIC)   Total Bilirubin           0.9 mg/dL                   1.3-0.8   Direct Bilirubin          0.2 mg/dL                   6.5-7.8   Alkaline Phosphatase [L]  38 U/L                      39-117   AST                       21 U/L                      0-37   ALT  16 U/L                      0-53   Total Protein             6.5 g/dL                    1.4-7.8   Albumin                   3.7 g/dL                    2.9-5.6  Tests: (4) B-Type Natiuretic Peptide (BNPR)  B-Type Natriuetic Peptide                        [H]  138.5 pg/mL                 0.0-100.0  DG ESOPHAGUS - [id number]   Clinical Data: Dysphasia.   ESOPHOGRAM/BARIUM SWALLOW   Technique:  Combined double contrast and single contrast examination performed using effervescent crystals, thick barium liquid, and thin barium liquid.   Fluoroscopy time:  2.2 minutes.   Comparison: None.   Findings:  Anterior osteophyte C5-6 level with minimal impression upon the posterior aspect of the cervical esophagus.  No aspiration or penetration.   Mild tertiary wave contractions suggestive of mild presbyesophagus. No reflux.  Small sliding type hiatal hernia.  Just above the hiatal hernia, there is an area which becomes transiently narrowed but does open with further swallowing.  13 mm barium tablet traverses  throughout this region without difficulty.   AICD is in place.  Status post CABG.  Biapical pleural thickening without associated bony destruction.   IMPRESSION: Mild presbyesophagus.   Small sliding type hiatal hernia.   Transit narrowing of the esophagus just above the hiatal hernia. 13 mm barium tablet traverses beyond this region without difficulty.   Read By:  Fuller Canada,  M.D.

## 2010-03-16 NOTE — Medication Information (Signed)
Summary: ROV/TM  Anticoagulant Therapy  Managed by: Leota Sauers, PharmD, BCPS, CPP Referring MD: Sherryl Manges MD PCP: Link Snuffer MD: Daleen Squibb MD, Maisie Fus Indication 1: Atrial Fibrillation (ICD-427.31) Indication 2: TIA (ICD-435.9) Lab Used: [facility] Site: [facility] INR POC 2.7 INR RANGE 2 - 3  Dietary changes: no    Health status changes: no    Bleeding/hemorrhagic complications: no    Recent/future hospitalizations: no    Any changes in medication regimen? no    Recent/future dental: no  Any missed doses?: no       Is patient compliant with meds? yes       Current Medications (verified): 1)  Aspirin Low Dose 81 Mg  Tabs (Aspirin) .... Once Daily 2)  Benazepril Hcl 20 Mg Tabs (Benazepril Hcl) .... Take 1 Tablet By Mouth Once A Day 3)  Coumadin 5 Mg  Tabs (Warfarin Sodium) .... Use As Directed 4)  Multivitamins   Caps (Multiple Vitamin) .... Once Daily 5)  Tylenol Extra Strength 500 Mg  Tabs (Acetaminophen) .... Take 2 Tablet By Mouth Two Times A Day 6)  Simvastatin 40 Mg Tabs (Simvastatin) .... Take 1/2 Tablet By Mouth Once A Day 7)  Tikosyn 125 Mcg Caps (Dofetilide) .... Take 1 Tablet By Mouth Two Times A Day 8)  Nitrostat 0.4 Mg Subl (Nitroglycerin) .... As Needed 9)  Voltaren 1 % Gel (Diclofenac Sodium) .... Apply 4 Grams To Affected Area Two Times A Day 10)  Furosemide 40 Mg Tabs (Furosemide) .... Take 1 Tablet By Mouth Once A Day 11)  Klor-Con M10 10 Meq Cr-Tabs (Potassium Chloride Crys Cr) .... Take 1 Tablet By Mouth Once A Day 12)  Fish Oil 1000 Mg Caps (Omega-3 Fatty Acids) .... Take 1 Tablet By Mouth Once A Day 13)  Magnesium 200 Mg Tabs (Magnesium) .... Once Daily 14)  Calcium Carbonate-Vitamin D 600-400 Mg-Unit  Tabs (Calcium Carbonate-Vitamin D) .... Once Daily 15)  Potassium Gluconate 550 Mg Tabs (Potassium Gluconate) .... Once Daily 16)  Flaxseed Oil 1200 Mg Caps (Flaxseed (Linseed)) .... Take 1 Capsule By Mouth Daily  Allergies: 1)  !  Phenergan  Anticoagulation Management History:      The patient is taking warfarin and comes in today for a routine follow up visit.  Positive risk factors for bleeding include an age of 75 years or older.  The bleeding index is 'intermediate risk'.  Positive CHADS2 values include History of CHF and Age > 75 years old.  The start date was 07/15/2004.  His last INR was 2.4 RATIO.  Anticoagulation responsible provider: Daleen Squibb MD, Maisie Fus.  INR POC: 2.7.  Cuvette Lot#: [id number].  Exp: 06/2010.    Anticoagulation Management Assessment/Plan:      The patient's current anticoagulation dose is Coumadin 5 mg  tabs: use as directed.  The target INR is 2 - 3.  The next INR is due 07/09/2009.  Anticoagulation instructions were given to patient.  Results were reviewed/authorized by Leota Sauers, PharmD, BCPS, CPP.         Prior Anticoagulation Instructions: INR 2.9 Continue 5mg s daily except 2.5mg s on Fridays. Recheck in 4 weeks.   Current Anticoagulation Instructions: INR 2.7  Coumadin 1 tab = 5mg  each day except 1/2 tab = 2.5mg  on Fri

## 2010-03-16 NOTE — Progress Notes (Signed)
Summary: Medication question   Phone Note Call from Patient Call back at Home Phone [phone]   Caller: Patient Summary of Call: Question about medication Initial call taken by: Judie Grieve,  July 28, 2009 9:25 AM  Follow-up for Phone Call        Phone Call Completed[facility] [address] [facility] 125 MG . Follow-up by: Scherrie Bateman, LPN,  July 28, 2009 10:10 AM    Prescriptions: [name] 125 MCG CAPS (DOFETILIDE) Take 1 tablet by mouth two times a day  #180 x 3   Entered by:   Scherrie Bateman, LPN   Authorized by:   Nathen May, MD, [facility]   Signed by:   Scherrie Bateman, LPN on 09/81/1914   Method used:   Electronically to        [facility]  [phone]* (retail)       [address] [place], [place]  [address]       Ph: [phone] or [phone]       Fax: [phone]   RxID:   [id number]

## 2010-03-16 NOTE — Letter (Signed)
Summary: Remote Device Check  [facility], Main Office  [address]   [place], [place] [address]   Phone: [phone]  Fax: [phone]     April 10, 2009 MRN: [id number]   [facility] [place] [address] [place], [place]  [address]   Dear Mr. Achee,   Your remote transmission was recieved and reviewed by your physician.  All diagnostics were within normal limits for you.    __X____Your next office visit is scheduled for:  MAY 2011 WITH DR Graciela Husbands. Please call our office to schedule an appointment.    Sincerely,  Proofreader

## 2010-03-16 NOTE — Cardiovascular Report (Signed)
Summary: Office Visit Remote   Office Visit Remote   Imported By: Roderic Ovens 04/10/2009 13:56:57  _____________________________________________________________________  External Attachment:    Type:   Image     Comment:   External Document

## 2010-03-16 NOTE — Medication Information (Signed)
Summary: rov/tm  Anticoagulant Therapy  Managed by: Bethena Midget, RN, BSN Referring MD: Sherryl Manges MD PCP: Link Snuffer MD: Shirlee Latch MD,Dalton Indication 1: Atrial Fibrillation (ICD-427.31) Indication 2: TIA (ICD-435.9) Lab Used: [facility] Site: [facility] INR POC 3.2 INR RANGE 2 - 3  Dietary changes: no    Health status changes: no    Bleeding/hemorrhagic complications: no    Recent/future hospitalizations: no    Any changes in medication regimen? yes       Details: flu shot tuesday  Recent/future dental: no  Any missed doses?: no       Is patient compliant with meds? yes       Allergies: 1)  ! Phenergan  Anticoagulation Management History:      The patient is taking warfarin and comes in today for a routine follow up visit.  Positive risk factors for bleeding include an age of 72 years or older and presence of serious comorbidities.  The bleeding index is 'intermediate risk'.  Positive CHADS2 values include History of CHF and Age > 40 years old.  The start date was 07/15/2004.  His last INR was 2.4 RATIO.  Anticoagulation responsible provider: Shirlee Latch MD,Dalton.  INR POC: 3.2.  Cuvette Lot#: [id number].  Exp: 11/2010.    Anticoagulation Management Assessment/Plan:      The patient's current anticoagulation dose is Coumadin 5 mg  tabs: use as directed.  The target INR is 2 - 3.  The next INR is due 11/12/2009.  Anticoagulation instructions were given to patient.  Results were reviewed/authorized by Bethena Midget, RN, BSN.  He was notified by Weston Brass PharmD.         Prior Anticoagulation Instructions: INR 2.7 Continue 5mg s everyday except 2.5mg s on Fridays. Recheck in 4 weeks.   Current Anticoagulation Instructions: INR 3.2 Hold your dose today. Keep your dosing schedule the same. You've been stable at that dose for a long time. We'll see you in 2 weeks to make sure you're back down.

## 2010-03-16 NOTE — Progress Notes (Signed)
    Immunization History:  Influenza Immunization History:    Influenza:  0.73ml deltoid lot# [id number] exp date 06/2010 (10/27/2009)

## 2010-03-16 NOTE — Medication Information (Signed)
Summary: rov.mp  Anticoagulant Therapy  Managed by: Bethena Midget, RN, BSN Referring MD: Sherryl Manges MD PCP: Link Snuffer MD: Ladona Ridgel MD, Sharlot Gowda Indication 1: Atrial Fibrillation (ICD-427.31) Indication 2: TIA (ICD-435.9) Lab Used: [facility] Site: [facility] INR POC 2.9 INR RANGE 2 - 3  Dietary changes: no    Health status changes: no    Bleeding/hemorrhagic complications: no    Recent/future hospitalizations: no    Any changes in medication regimen? no    Recent/future dental: no  Any missed doses?: no       Is patient compliant with meds? yes       Allergies: 1)  ! Phenergan  Anticoagulation Management History:      The patient is taking warfarin and comes in today for a routine follow up visit.  Positive risk factors for bleeding include an age of 75 years or older.  The bleeding index is 'intermediate risk'.  Positive CHADS2 values include History of CHF and Age > 73 years old.  The start date was 07/15/2004.  His last INR was 2.4 RATIO.  Anticoagulation responsible provider: Ladona Ridgel MD, Sharlot Gowda.  INR POC: 2.9.  Cuvette Lot#: [id number].  Exp: 06/2010.    Anticoagulation Management Assessment/Plan:      The patient's current anticoagulation dose is Coumadin 5 mg  tabs: use as directed.  The target INR is 2 - 3.  The next INR is due 06/11/2009.  Anticoagulation instructions were given to patient.  Results were reviewed/authorized by Bethena Midget, RN, BSN.  He was notified by Bethena Midget, RN, BSN.         Prior Anticoagulation Instructions: INR 2.4  Continue 1 tab daily except 0.5 tab each Friday.  Recheck in 4 weeks.   Current Anticoagulation Instructions: INR 2.9 Continue 5mg s daily except 2.5mg s on Fridays. Recheck in 4 weeks.

## 2010-03-16 NOTE — Progress Notes (Signed)
Summary: Question about dental appt   Phone Note Call from Patient Call back at Home Phone [phone]   Caller: Patient Summary of Call: Pt have dental appt on Tuesday and want to know if he needs antibiotic Initial call taken by: Judie Grieve,  June 01, 2009 10:17 AM  Follow-up for Phone Call        spoke with pt, no indication for antibiotics at this time Deliah Goody, RN  June 01, 2009 10:30 AM

## 2010-03-16 NOTE — Medication Information (Signed)
Summary: [facility]v coumadin - [facility]  Anticoagulant Therapy  Managed by: Weston Brass, PharmD Referring MD: Sherryl Manges MD PCP: Link Snuffer MD: Daleen Squibb MD, Maisie Fus Indication 1: Atrial Fibrillation (ICD-427.31) Indication 2: TIA (ICD-435.9) Lab Used: [facility] Site: [facility] INR POC 3.0 INR RANGE 2 - 3  Dietary changes: no    Health status changes: no    Bleeding/hemorrhagic complications: no    Recent/future hospitalizations: no    Any changes in medication regimen? no    Recent/future dental: no  Any missed doses?: no       Is patient compliant with meds? yes       Allergies: 1)  ! Phenergan  Anticoagulation Management History:      The patient is taking warfarin and comes in today for a routine follow up visit.  Positive risk factors for bleeding include an age of 75 years or older and presence of serious comorbidities.  The bleeding index is 'intermediate risk'.  Positive CHADS2 values include History of CHF and Age > 55 years old.  The start date was 07/15/2004.  His last INR was 2.4 RATIO.  Anticoagulation responsible provider: Daleen Squibb MD, Maisie Fus.  INR POC: 3.0.  Cuvette Lot#: [id number].  Exp: 09/2010.    Anticoagulation Management Assessment/Plan:      The patient's current anticoagulation dose is Coumadin 5 mg  tabs: use as directed.  The target INR is 2 - 3.  The next INR is due 08/06/2009.  Anticoagulation instructions were given to patient.  Results were reviewed/authorized by Weston Brass, PharmD.  He was notified by Weston Brass PharmD.         Prior Anticoagulation Instructions: INR 2.7  Coumadin 1 tab = 5mg  each day except 1/2 tab = 2.5mg  on Fri  Current Anticoagulation Instructions: INR 3.0  Continue same dose of 1 tablet every day except 1/2 tablet on Friday

## 2010-03-18 ENCOUNTER — Encounter: Payer: Self-pay | Admitting: Internal Medicine

## 2010-03-18 NOTE — Medication Information (Signed)
Summary: ccr  Anticoagulant Therapy  Managed by: Bethena Midget, RN, BSN Referring MD: Sherryl Manges MD PCP: Link Snuffer MD: Riley Kill MD, Maisie Fus Indication 1: Atrial Fibrillation (ICD-427.31) Indication 2: TIA (ICD-435.9) Lab Used: [facility] Site: [facility] INR POC 2.3 INR RANGE 2 - 3  Dietary changes: no    Health status changes: no    Bleeding/hemorrhagic complications: no    Recent/future hospitalizations: no    Any changes in medication regimen? no    Recent/future dental: no  Any missed doses?: no       Is patient compliant with meds? yes       Allergies: 1)  ! Phenergan  Anticoagulation Management History:      The patient is taking warfarin and comes in today for a routine follow up visit.  Positive risk factors for bleeding include an age of 75 years or older and presence of serious comorbidities.  The bleeding index is 'intermediate risk'.  Positive CHADS2 values include History of CHF and Age > 75 years old.  The start date was 07/15/2004.  His last INR was 2.4 RATIO.  Anticoagulation responsible Kathlean Cinco: Riley Kill MD, Maisie Fus.  INR POC: 2.3.  Cuvette Lot#: [id number].  Exp: 02/2011.    Anticoagulation Management Assessment/Plan:      The patient's current anticoagulation dose is Coumadin 5 mg  tabs: use as directed.  The target INR is 2 - 3.  The next INR is due 03/09/2010.  Anticoagulation instructions were given to patient.  Results were reviewed/authorized by Bethena Midget, RN, BSN.  He was notified by Bethena Midget, RN, BSN.         Prior Anticoagulation Instructions: INR 2.4 Continue 5mg s daily except 2.5mg s on Fridays. Recheck in 4 weeks.   Current Anticoagulation Instructions: INR 2.3 Continue 5mg s everyday except 2.5mg s on Fridays. Recheck in 4 weeks.

## 2010-03-18 NOTE — Medication Information (Signed)
Summary: rov/tm  Anticoagulant Therapy  Managed by: Weston Brass, PharmD Referring MD: Sherryl Manges MD PCP: Link Snuffer MD: Graciela Husbands MD, Viviann Spare Indication 1: Atrial Fibrillation (ICD-427.31) Indication 2: TIA (ICD-435.9) Lab Used: [facility] Site: [facility] INR POC 2.2 INR RANGE 2 - 3  Dietary changes: no    Health status changes: no    Bleeding/hemorrhagic complications: no    Recent/future hospitalizations: yes       Details: Radiological CT scan 03/18/10   Any changes in medication regimen? yes       Details: Decreased K supplements   Recent/future dental: no  Any missed doses?: no       Is patient compliant with meds? yes       Current Medications (verified): 1)  Aspirin Low Dose 81 Mg  Tabs (Aspirin) .... Once Daily 2)  Benazepril Hcl 20 Mg Tabs (Benazepril Hcl) .... Take 1 Tablet By Mouth Once A Day 3)  Coumadin 5 Mg  Tabs (Warfarin Sodium) .... Use As Directed 4)  Multivitamins   Caps (Multiple Vitamin) .... Once Daily 5)  Tylenol Extra Strength 500 Mg  Tabs (Acetaminophen) .... Take 2 Tablet By Mouth Two Times A Day 6)  Simvastatin 40 Mg Tabs (Simvastatin) .... Take 1/2 Tablet By Mouth Once A Day 7)  Tikosyn 125 Mcg Caps (Dofetilide) .... Take 1 Tablet By Mouth Two Times A Day 8)  Nitrostat 0.4 Mg Subl (Nitroglycerin) .... As Needed 9)  Voltaren 1 % Gel (Diclofenac Sodium) .... Apply 4 Grams To Affected Area Two Times A Day 10)  Furosemide 40 Mg Tabs (Furosemide) .... Take 1 Tablet By Mouth Once A Day 11)  Klor-Con M10 10 Meq Cr-Tabs (Potassium Chloride Crys Cr) .... Take 1 Tablet By Mouth Once A Day 12)  Fish Oil 1000 Mg Caps (Omega-3 Fatty Acids) .... Take 1 Tablet By Mouth Once A Day 13)  Magnesium Oxide 500 Mg Tabs (Magnesium Oxide) .... Take 1 Tablet By Mouth Once A Day 14)  Calcium Carbonate-Vitamin D 600-400 Mg-Unit  Tabs (Calcium Carbonate-Vitamin D) .... Once Daily 15)  Potassium Gluconate 550 Mg Tabs (Potassium Gluconate) .... Once Daily 16)   Flaxseed Oil 1200 Mg Caps (Flaxseed (Linseed)) .... Take 1 Capsule By Mouth Daily 17)  Sulfamethoxazole-Tmp Ds 800-160 Mg Tabs (Sulfamethoxazole-Trimethoprim) .Marland Kitchen.. 1 By Mouth Two Times A Day X 14 Days For Prostatitis  Allergies (verified): 1)  ! Phenergan  Anticoagulation Management History:      The patient is taking warfarin and comes in today for a routine follow up visit.  Positive risk factors for bleeding include an age of 75 years or older and presence of serious comorbidities.  The bleeding index is 'intermediate risk'.  Positive CHADS2 values include History of CHF and Age > 32 years old.  The start date was 07/15/2004.  His last INR was 2.4 RATIO.  Anticoagulation responsible provider: Graciela Husbands MD, Viviann Spare.  INR POC: 2.2.  Cuvette Lot#: [id number].  Exp: 02/2011.    Anticoagulation Management Assessment/Plan:      The patient's current anticoagulation dose is Coumadin 5 mg  tabs: use as directed.  The target INR is 2 - 3.  The next INR is due 04/06/2010.  Anticoagulation instructions were given to patient.  Results were reviewed/authorized by Weston Brass, PharmD.  He was notified by Stephannie Peters, PharmD Candidate .         Prior Anticoagulation Instructions: INR 2.3 Continue 5mg s everyday except 2.5mg s on Fridays. Recheck in 4 weeks.   Current Anticoagulation  Instructions: INR 2.2  Coumadin 5 mg tablets - Continue 1 tablet every day except 1/2 tablet on Fridays

## 2010-03-18 NOTE — Letter (Signed)
   [facility] [address] [place], [place]  [address] Phone: [phone]      March 08, 2010   [facility] [address] [place], [place] [address]  RE:  LAB RESULTS  Dear  [name],  The following is an interpretation of your most recent lab tests.  Please take note of any instructions provided or changes to medications that have resulted from your lab work.  ELECTROLYTES:  Good - no changes needed  KIDNEY FUNCTION TESTS:  Good - no changes needed    DIABETIC STUDIES:  Good - no changes needed Blood Glucose: 84    Lab results are fine.  Please come see me if you have any questions about these lab results.   Sincerely Yours,    Jacques Navy MD  atient: [name] Note: All result statuses are Final unless otherwise noted.  Tests: (1) BMP (METABOL)   Sodium                    139 mEq/L                   135-145   Potassium            [H]  5.6 mEq/L                   3.5-5.1   Chloride                  104 mEq/L                   96-112   Carbon Dioxide            29 mEq/L                    19-32   Glucose                   84 mg/dL                    29-56   BUN                  [H]  31 mg/dL                    2-13   Creatinine                1.5 mg/dL                   0.8-6.5   Calcium                   9.1 mg/dL                   7.8-46.9   GFR                  [L]  49.54 mL/min                >60.00  Tests: (2) Magnesium (MG)   Magnesium                 2.3 mg/dL                   6.2-9.5

## 2010-03-18 NOTE — Miscellaneous (Signed)
Summary: Device preload  Clinical Lists Changes  Observations: Added new observation of ICD INDICATN: CM (02/20/2010 8:54) Added new observation of ICDLEADSTAT1: active (02/20/2010 8:54) Added new observation of ICDLEADSER1: 191478  (02/20/2010 8:54) Added new observation of ICDLEADMOD1: 0158  (02/20/2010 8:54) Added new observation of ICDLEADDOI1: 09/29/2003  (02/20/2010 8:54) Added new observation of ICDLEADLOC1: RV  (02/20/2010 8:54) Added new observation of ICD IMP MD: Sherryl Manges, MD  (02/20/2010 8:54) Added new observation of ICD IMPL DTE: 09/29/2003  (02/20/2010 8:54) Added new observation of ICD SERL#: `[id number]  (02/20/2010 8:54) Added new observation of ICD MODL#: T135  (02/20/2010 8:54) Added new observation of ICDMANUFACTR: [employer]  (02/20/2010 8:54)       ICD Specifications Following MD:  Sherryl Manges, MD     ICD Vendor:  [facility] [employer]     ICD Model Number:  T135     ICD Serial Number:  `[id number] ICD DOI:  09/29/2003     ICD Implanting MD:  Sherryl Manges, MD  Lead 1:    Location: RV     DOI: 09/29/2003     Model #: [id number]     Serial #: [id number]     Status: active  Indications::  CM   Episodes Coumadin:  Yes  Brady Parameters Mode VVI     Lower Rate Limit:  40      Tachy Zones VF:  240     VT:  200     VT1:  160

## 2010-03-23 ENCOUNTER — Other Ambulatory Visit (INDEPENDENT_AMBULATORY_CARE_PROVIDER_SITE_OTHER): Payer: Medicare Other

## 2010-03-23 ENCOUNTER — Other Ambulatory Visit: Payer: Self-pay | Admitting: Internal Medicine

## 2010-03-23 ENCOUNTER — Encounter: Payer: Self-pay | Admitting: Internal Medicine

## 2010-03-23 DIAGNOSIS — I4891 Unspecified atrial fibrillation: Secondary | ICD-10-CM

## 2010-03-23 LAB — BASIC METABOLIC PANEL
BUN: 30 mg/dL — ABNORMAL HIGH (ref 6–23)
CO2: 28 mEq/L (ref 19–32)
Calcium: 9.1 mg/dL (ref 8.4–10.5)
GFR: 49.53 mL/min — ABNORMAL LOW (ref >60.00)
Glucose, Bld: 81 mg/dL (ref 70–99)
Sodium: 138 mEq/L (ref 135–145)

## 2010-03-25 ENCOUNTER — Encounter (INDEPENDENT_AMBULATORY_CARE_PROVIDER_SITE_OTHER): Payer: Medicare Other

## 2010-03-25 ENCOUNTER — Encounter: Payer: Self-pay | Admitting: Internal Medicine

## 2010-03-25 DIAGNOSIS — I428 Other cardiomyopathies: Secondary | ICD-10-CM

## 2010-03-31 ENCOUNTER — Telehealth: Payer: Self-pay | Admitting: Internal Medicine

## 2010-04-01 ENCOUNTER — Encounter: Payer: Self-pay | Admitting: Internal Medicine

## 2010-04-01 ENCOUNTER — Ambulatory Visit (INDEPENDENT_AMBULATORY_CARE_PROVIDER_SITE_OTHER): Payer: Medicare Other | Admitting: Internal Medicine

## 2010-04-01 DIAGNOSIS — H9190 Unspecified hearing loss, unspecified ear: Secondary | ICD-10-CM

## 2010-04-05 ENCOUNTER — Encounter (INDEPENDENT_AMBULATORY_CARE_PROVIDER_SITE_OTHER): Payer: Self-pay | Admitting: *Deleted

## 2010-04-06 ENCOUNTER — Encounter: Payer: Self-pay | Admitting: Cardiovascular Disease

## 2010-04-06 ENCOUNTER — Encounter (INDEPENDENT_AMBULATORY_CARE_PROVIDER_SITE_OTHER): Payer: Medicare Other

## 2010-04-06 DIAGNOSIS — I4891 Unspecified atrial fibrillation: Secondary | ICD-10-CM

## 2010-04-06 DIAGNOSIS — Z8679 Personal history of other diseases of the circulatory system: Secondary | ICD-10-CM

## 2010-04-06 DIAGNOSIS — Z7901 Long term (current) use of anticoagulants: Secondary | ICD-10-CM

## 2010-04-07 NOTE — Progress Notes (Signed)
Summary: test results* Phone Busy**   Phone Note Call from Patient Call back at [facility] Phone [phone]   Caller: Patient Reason for Call: Talk to Doctor, Lab or Test Results Initial call taken by: Lorne Skeens,  March 31, 2010 9:10 AM  Follow-up for Phone Call        Phone busy** Whitney Maeola Sarah RN  March 31, 2010 9:29 AM  Patient is aware. He will monitor his intake of potassium enriched foods. Whitney Maeola Sarah RN  March 31, 2010 9:53 AM  Follow-up by: Whitney Maeola Sarah RN,  March 31, 2010 9:29 AM

## 2010-04-07 NOTE — Assessment & Plan Note (Signed)
Summary: ear pain   Vital Signs:  Patient profile:   77 year old male Height:      73.50 inches Weight:      187.50 pounds O2 Sat:      97 % on Room air Temp:     97.6 degrees F oral Pulse rate:   62 / minute Resp:     16 per minute BP sitting:   122 / 68  (left arm)  Vitals Entered By: Burnard Leigh CMA(AAMA) (April 01, 2010 10:16 AM)  O2 Flow:  Room air CC: Pt c/o pain in Left Ear that has hearing aid and sinus dryness associated with.Pt also c/o chronic pain in Right index finger knuckle joint/sls,cma Is Patient Diabetic? No   Primary Care Provider:  Markies Mowatt  CC:  Pt c/o pain in Left Ear that has hearing aid and sinus dryness associated with.Pt also c/o chronic pain in Right index finger knuckle joint/sls and cma.  History of Present Illness: Mr. Schiele presents for evaluation of his left ear. He wears ear bud hearing aids. He has been using a lubricant to place the buds. He reports that 2 days ago he developed some pain in his left ear and then swelling. He had rinsed the ear with water and left out the ear bud and his symptoms markedly improved.  He has some minor residual discomfort.  He has pain in the index finger right MCP joint - this is chronic  but worse lately.  Current Medications (verified): 1)  Aspirin Low Dose 81 Mg  Tabs (Aspirin) .... Once Daily 2)  Benazepril Hcl 20 Mg Tabs (Benazepril Hcl) .... Take 1 Tablet By Mouth Once A Day 3)  Coumadin 5 Mg  Tabs (Warfarin Sodium) .... Use As Directed 4)  Multivitamins   Caps (Multiple Vitamin) .... Once Daily 5)  Tylenol Extra Strength 500 Mg  Tabs (Acetaminophen) .... Take 2 Tablet By Mouth Two Times A Day 6)  Simvastatin 40 Mg Tabs (Simvastatin) .... Take 1/2 Tablet By Mouth Once A Day 7)  Tikosyn 125 Mcg Caps (Dofetilide) .... Take 1 Tablet By Mouth Two Times A Day 8)  Nitrostat 0.4 Mg Subl (Nitroglycerin) .... As Needed 9)  Voltaren 1 % Gel (Diclofenac Sodium) .... Apply 4 Grams To Affected Area Two Times A  Day 10)  Furosemide 40 Mg Tabs (Furosemide) .... Take 1 Tablet By Mouth Once A Day 11)  Klor-Con M10 10 Meq Cr-Tabs (Potassium Chloride Crys Cr) .... Take 1 Tablet By Mouth Once A Day 12)  Fish Oil 1000 Mg Caps (Omega-3 Fatty Acids) .... Take 1 Tablet By Mouth Once A Day 13)  Magnesium Oxide 500 Mg Tabs (Magnesium Oxide) .... Take 1 Tablet By Mouth Once A Day 14)  Calcium Carbonate-Vitamin D 600-400 Mg-Unit  Tabs (Calcium Carbonate-Vitamin D) .... Once Daily 15)  Flaxseed Oil 1200 Mg Caps (Flaxseed (Linseed)) .... Take 1 Capsule By Mouth Daily  Allergies (verified): 1)  ! 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.9) UNSPECIFIED HEARING LOSS (ICD-389.9) NECK PAIN, CHRONIC (ICD-723.1) * UCD ISCHEMIC COLITIS, HX OF (ICD-V12.79) CEREBROVASCULAR ACCIDENT, HX OF (ICD-V12.50)  Past Surgical History: Last updated: 09/01/2009 Colectomy (07/15/1996) Colostomy (07/15/1996) take down of colostomy 01/14/1997 Coronary artery bypass  graft (03/22/1989) angioplasty w/ stent RCA 2001 RF Ablation 11-07-2004 AAA stent 4/01, endovascular relining of graft 3/11 AICD '06  Family History: Last updated: 08-Nov-2007 father - deceased @ 68: CAD/MI x 6 mother - deceased @ 41: CVA Neg- prostate or colon cancer; DM Brother - CAD Sister - RA  Social History: [employer] - Counselling psychologist married '52- 48 years, widowed; married '02 1 son - '55; 2 daughters -'29, '49; 8 grandchildren 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 SO- has spinal stenosis with significant symptoms - getting some relief with ESI. She also has early dementia. 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 This has limited his time for the gym.  Tobacco Use - No.  Alcohol Use - no Drug Use - no  Physical Exam  General:  Well-developed,well-nourished,in no acute distress; alert,appropriate and cooperative throughout examination Head:  normocephalic and atraumatic.   Ears:  Left EAC with cerumen - after irrigation TM appears normal and EAC without injury.  Lungs:  normal respiratory effort and normal breath sounds.   Heart:  normal rate and regular rhythm.     Impression & Recommendations:  Problem # 1:  UNSPECIFIED HEARING LOSS (ICD-389.9) Mr. Sutch with swelling of the left ear and increased hearing loss may have had an inflammation/infection AS but the swelling was resolved at exam. He was found to have some cerumen impacted in the left EAC which was removed easily with irrigation. There was no sign of residual swelling or inflammation.  Plan - resume use of hearing aids Feb 17th  Complete Medication List: 1)  Aspirin Low Dose 81 Mg Tabs (Aspirin) .... Once daily 2)  Benazepril Hcl 20 Mg Tabs (Benazepril hcl) .... Take 1 tablet by mouth once a day 3)  Coumadin 5 Mg Tabs (Warfarin sodium) .... Use as directed 4)  Multivitamins Caps (Multiple vitamin) .... Once daily 5)  Tylenol Extra Strength 500 Mg Tabs (Acetaminophen) .... Take 2 tablet by mouth two times a day 6)  Simvastatin 40 Mg Tabs (Simvastatin) .... Take 1/2 tablet by mouth once a day 7)  Tikosyn 125 Mcg Caps (Dofetilide) .... Take 1 tablet by mouth two times a day 8)  Nitrostat 0.4 Mg Subl (Nitroglycerin) .... As needed 9)  Voltaren 1 % Gel (Diclofenac sodium) .... Apply 4 grams to affected area two times a day 10)  Furosemide 40 Mg Tabs (Furosemide) .... Take 1 tablet by mouth once a day 11)  Klor-con M10 10 Meq Cr-tabs (Potassium chloride crys cr) ....  Take 1 tablet by mouth once a day 12)  Fish Oil 1000 Mg Caps (Omega-3 fatty acids) .... Take 1 tablet by mouth once a day 13)  Magnesium Oxide 500 Mg Tabs (Magnesium oxide) .... Take 1 tablet by mouth once a day 14)  Calcium Carbonate-vitamin D 600-400 Mg-unit Tabs (Calcium carbonate-vitamin d) .... Once daily 15)  Flaxseed Oil 1200 Mg Caps (Flaxseed (linseed)) .... Take 1 capsule by mouth daily   Orders Added: 1)  Est. Patient Level III [[address]]

## 2010-04-13 NOTE — Medication Information (Signed)
Summary: rov/kh   Anticoagulant Therapy  Managed by: Tammy Sours, PharmD  Referring MD: Sherryl Manges MD PCP: Link Snuffer MD: Excell Seltzer MD, Casimiro Needle Indication 1: Atrial Fibrillation (ICD-427.31) Indication 2: TIA (ICD-435.9) Lab Used: [facility] Site: [facility] INR POC 2.5 INR RANGE 2 - 3  Dietary changes: no    Health status changes: no    Bleeding/hemorrhagic complications: no    Recent/future hospitalizations: no    Any changes in medication regimen? no    Recent/future dental: no  Any missed doses?: no       Is patient compliant with meds? yes       Allergies: 1)  ! Phenergan  Anticoagulation Management History:      The patient is taking warfarin and comes in today for a routine follow up visit.  Positive risk factors for bleeding include an age of 30 years or older and presence of serious comorbidities.  The bleeding index is 'intermediate risk'.  Positive CHADS2 values include History of CHF and Age > 7 years old.  The start date was 07/15/2004.  His last INR was 2.4 RATIO.  Anticoagulation responsible provider: Excell Seltzer MD, Casimiro Needle.  INR POC: 2.5.  Cuvette Lot#: [id number].  Exp: 02/2011.    Anticoagulation Management Assessment/Plan:      The patient's current anticoagulation dose is Coumadin 5 mg  tabs: use as directed.  The target INR is 2 - 3.  The next INR is due 05/04/2010.  Anticoagulation instructions were given to patient.  Results were reviewed/authorized by Tammy Sours, PharmD .         Prior Anticoagulation Instructions: INR 2.2  Coumadin 5 mg tablets - Continue 1 tablet every day except 1/2 tablet on Fridays   Current Anticoagulation Instructions: INR 2.5  Continue taking 1 tablet everday except 1/2 tablets on Fridays. Recheck INR in 4 weeks.

## 2010-04-13 NOTE — Cardiovascular Report (Signed)
Summary: Office Visit   Office Visit   Imported By: Roderic Ovens 04/09/2010 09:09:09  _____________________________________________________________________  External Attachment:    Type:   Image     Comment:   External Document

## 2010-04-13 NOTE — Letter (Signed)
Summary: Remote Device Check  [employer], Main Office  [address]   [place], [place] [address]   Phone: [phone]  Fax: [phone]     April 05, 2010 MRN: [id number]   [facility] [place] [address] [place], [place]  [address]   Dear Mr. [name],   Your remote transmission was recieved and reviewed by your physician.  All diagnostics were within normal limits for you.  __X____Your next office visit is scheduled for:  May 2012 with Dr Graciela Husbands. Please call our office to schedule an appointment.    Sincerely,  Vella Kohler

## 2010-04-22 NOTE — Letter (Signed)
Summary: Suszanne Finch MD/[facility]  Suszanne Finch MD/[facility]   Imported By: Lester Ironwood 04/13/2010 10:25:28  _____________________________________________________________________  External Attachment:    Type:   Image     Comment:   External Document

## 2010-05-04 ENCOUNTER — Ambulatory Visit (INDEPENDENT_AMBULATORY_CARE_PROVIDER_SITE_OTHER): Payer: Medicare Other | Admitting: *Deleted

## 2010-05-04 DIAGNOSIS — Z8679 Personal history of other diseases of the circulatory system: Secondary | ICD-10-CM

## 2010-05-04 DIAGNOSIS — I4891 Unspecified atrial fibrillation: Secondary | ICD-10-CM

## 2010-05-04 DIAGNOSIS — Z7901 Long term (current) use of anticoagulants: Secondary | ICD-10-CM

## 2010-05-04 NOTE — Patient Instructions (Signed)
INR 2.6 Continue taking 1 tablet daily, except take 1/2 tablet on Fridays. Recheck in 4 weeks.

## 2010-05-31 LAB — DIFFERENTIAL
Basophils Absolute: 0 10*3/uL (ref 0.0–0.1)
Basophils Relative: 0 % (ref 0–1)
Eosinophils Absolute: 0.1 10*3/uL (ref 0.0–0.7)
Eosinophils Relative: 1 % (ref 0–5)

## 2010-05-31 LAB — POCT CARDIAC MARKERS
CKMB, poc: 1.9 ng/mL (ref 1.0–8.0)
Myoglobin, poc: 96.2 ng/mL (ref 12–200)
Troponin i, poc: <0.05 ng/mL (ref 0.00–0.09)

## 2010-05-31 LAB — BASIC METABOLIC PANEL
BUN: 15 mg/dL (ref 6–23)
BUN: 24 mg/dL — ABNORMAL HIGH (ref 6–23)
CO2: 26 mEq/L (ref 19–32)
CO2: 31 mEq/L (ref 19–32)
Chloride: 100 mEq/L (ref 96–112)
Chloride: 98 mEq/L (ref 96–112)
Chloride: 98 mEq/L (ref 96–112)
Creatinine, Ser: 1.19 mg/dL (ref 0.4–1.5)
Creatinine, Ser: 1.39 mg/dL (ref 0.4–1.5)
GFR calc Af Amer: >60 mL/min (ref >60)
GFR calc non Af Amer: >60 mL/min (ref >60)
Glucose, Bld: 112 mg/dL — ABNORMAL HIGH (ref 70–99)
Glucose, Bld: 141 mg/dL — ABNORMAL HIGH (ref 70–99)
Potassium: 3.8 mEq/L (ref 3.5–5.1)
Potassium: 4.1 mEq/L (ref 3.5–5.1)
Potassium: 4.7 mEq/L (ref 3.5–5.1)
Sodium: 135 mEq/L (ref 135–145)
Sodium: 135 mEq/L (ref 135–145)

## 2010-05-31 LAB — POCT I-STAT 3, ART BLOOD GAS (G3+)
Patient temperature: 98
TCO2: 24 mmol/L (ref 0–100)
pCO2 arterial: 38.4 mmHg (ref 35.0–45.0)
pH, Arterial: 7.386 (ref 7.350–7.450)

## 2010-05-31 LAB — GLUCOSE, CAPILLARY
Glucose-Capillary: 107 mg/dL — ABNORMAL HIGH (ref 70–99)
Glucose-Capillary: 125 mg/dL — ABNORMAL HIGH (ref 70–99)

## 2010-05-31 LAB — PROTIME-INR
INR: 2.3 — ABNORMAL HIGH (ref 0.00–1.49)
Prothrombin Time: 25.9 seconds — ABNORMAL HIGH (ref 11.6–15.2)
Prothrombin Time: 27.2 seconds — ABNORMAL HIGH (ref 11.6–15.2)
Prothrombin Time: 27.4 seconds — ABNORMAL HIGH (ref 11.6–15.2)

## 2010-05-31 LAB — POCT I-STAT, CHEM 8
BUN: 18 mg/dL (ref 6–23)
Chloride: 103 mEq/L (ref 96–112)
Creatinine, Ser: 1 mg/dL (ref 0.4–1.5)
Potassium: 4.9 mEq/L (ref 3.5–5.1)
Sodium: 134 mEq/L — ABNORMAL LOW (ref 135–145)

## 2010-05-31 LAB — CBC
HCT: 40.9 % (ref 39.0–52.0)
MCV: 101.2 fL — ABNORMAL HIGH (ref 78.0–100.0)
Platelets: 96 10*3/uL — ABNORMAL LOW (ref 150–400)
RDW: 14.1 % (ref 11.5–15.5)

## 2010-05-31 LAB — CK TOTAL AND CKMB (NOT AT ARMC)
Relative Index: INVALID (ref 0.0–2.5)
Total CK: 84 U/L (ref 7–232)

## 2010-05-31 LAB — CARDIAC PANEL(CRET KIN+CKTOT+MB+TROPI): Troponin I: 0.01 ng/mL (ref 0.00–0.06)

## 2010-05-31 LAB — TROPONIN I: Troponin I: 0.01 ng/mL (ref 0.00–0.06)

## 2010-06-01 ENCOUNTER — Ambulatory Visit (INDEPENDENT_AMBULATORY_CARE_PROVIDER_SITE_OTHER): Payer: Medicare Other | Admitting: *Deleted

## 2010-06-01 DIAGNOSIS — I4891 Unspecified atrial fibrillation: Secondary | ICD-10-CM

## 2010-06-01 DIAGNOSIS — Z8679 Personal history of other diseases of the circulatory system: Secondary | ICD-10-CM

## 2010-06-01 LAB — POCT INR: INR: 2.4

## 2010-06-26 ENCOUNTER — Encounter: Payer: Self-pay | Admitting: Internal Medicine

## 2010-06-29 ENCOUNTER — Ambulatory Visit (INDEPENDENT_AMBULATORY_CARE_PROVIDER_SITE_OTHER): Payer: Medicare Other | Admitting: Internal Medicine

## 2010-06-29 ENCOUNTER — Encounter: Payer: Self-pay | Admitting: Internal Medicine

## 2010-06-29 ENCOUNTER — Ambulatory Visit (INDEPENDENT_AMBULATORY_CARE_PROVIDER_SITE_OTHER): Payer: Medicare Other | Admitting: *Deleted

## 2010-06-29 DIAGNOSIS — I4891 Unspecified atrial fibrillation: Secondary | ICD-10-CM

## 2010-06-29 DIAGNOSIS — I428 Other cardiomyopathies: Secondary | ICD-10-CM

## 2010-06-29 DIAGNOSIS — Z8679 Personal history of other diseases of the circulatory system: Secondary | ICD-10-CM

## 2010-06-29 DIAGNOSIS — Z95810 Presence of automatic (implantable) cardiac defibrillator: Secondary | ICD-10-CM

## 2010-06-29 DIAGNOSIS — I2510 Atherosclerotic heart disease of native coronary artery without angina pectoris: Secondary | ICD-10-CM

## 2010-06-29 DIAGNOSIS — I5022 Chronic systolic (congestive) heart failure: Secondary | ICD-10-CM

## 2010-06-29 DIAGNOSIS — Z7901 Long term (current) use of anticoagulants: Secondary | ICD-10-CM | POA: Insufficient documentation

## 2010-06-29 LAB — BASIC METABOLIC PANEL
BUN: 26 mg/dL — ABNORMAL HIGH (ref 6–23)
CO2: 31 mEq/L (ref 19–32)
Calcium: 9.2 mg/dL (ref 8.4–10.5)
Creatinine, Ser: 1.5 mg/dL (ref 0.4–1.5)
GFR: 47.6 mL/min — ABNORMAL LOW (ref >60.00)
Glucose, Bld: 87 mg/dL (ref 70–99)

## 2010-06-29 NOTE — Assessment & Plan Note (Signed)
[facility]                         ELECTROPHYSIOLOGY OFFICE NOTE   [name], [name]                          MRN:          [id number]  DATE:12/20/2006                            DOB:          12-Aug-1926    Mr. Eckrich is a longstanding patient of Dr. Marcha Solders who is status post ICD  for primary prevention some years ago. He has a history of coronary  disease and prior CABG in 1991, a Myoview in 2006 demonstrating no  significant ischemia or prior anteroapical or anteroseptal infarction.  Ejection fraction 29%. He has a history of peripheral vascular disease  and is status post stent grafting of a AAA. He has a history of prior  stroke for which he takes Coumadin.   He has a history of atrial flutter status post ablation.   His biggest complaint right now is progressive fatigue and he is only  working a couple hours at a time as well as orthostatic light-headedness  which is worse at night.   MEDICATIONS:  1. Aspirin 81.  2. Simvastatin 20.  3. Metoprolol succinate 25.  4. Benazepril 10.  5. Cholestyramine.  6. Warfarin.  7. Claritin.   ALLERGIES:  He is allergic to [facility] and has been intolerant in the  past of COREG.   PHYSICAL EXAMINATION:  VITAL SIGNS:  His blood pressure is 159/85 lying  with a pulse of 61, standing at 5 minutes it was 145/82 with a pulse of  63.  LUNGS:  Clear.  HEART:  Heart sounds were regular. There was a 2/6 murmur.  ABDOMEN:  Soft.  EXTREMITIES:  Without edema.  Neck veins were at about 7 cm.   IMPRESSION:  1. Ischemic heart disease with a) prior bypass, b) depressed left      ventricular function, EF of about 25-30%, c) class 2 congestive      heart failure.  2. Status post ICD for primary prevention.  3. Peripheral vascular disease. a)  prior transient ischemic attack,      b) abdominal aortic aneurysm status post stent grafting.  4. History of atrial flutter status post ablation.  5. Orthostatic  intolerance.   Mr. Guedes is stable for an ischemic point of view. I do not think there  is anything else to do about his functional status at the current time.  I am bothered by his orthostatic intolerance and suggested that he raise  that up about 4-6 inches en bloc.   We will see him again in 6 months' time.     Duke Salvia, MD, [facility]  Electronically Signed    SCK/MedQ  DD: 12/20/2006  DT: 12/21/2006  Job #: [id number]

## 2010-06-29 NOTE — Assessment & Plan Note (Signed)
[name]                            CARDIOLOGY OFFICE NOTE   [name], [name]                          MRN:          [id number]  DATE:06/28/2006                            DOB:          [birth year]    Mr. [name] is a very pleasant 77 year old white married male with a long  history of coronary artery disease, initial anterior septal MI in 55  in [place], [place].  He moved to [facility] the next year and has  been followed here since 1975.  He had 3-vessel coronary artery bypass  grafting in 1991, percutaneous angioplasty of the right coronary artery  in 2001, status post stent and graft repair of abdominal aortic  aneurysm.  He also has severe LV dysfunction with cardioverter  defibrillator implanted by Dr. Graciela Husbands in January of 2006.  EF at that  time was 29%.   The patient had a TIA in November 2006, unremarkable CT scan, and no  carotid stenosis.  A 2D echo revealed an EF of 20-30%.  Clots could not  be excluded and he was started on Coumadin.  The patient is quite stable  at this time.  He has a history of ischemic bowel which was operated on  by Dr. Mosetta Anis.   He has minimal carotid stenosis.   MEDICATIONS:  1. Aspirin 81 b.i.d.  2. Zocor 20.  3. Toprol XL 25.  4. Benazepril 10.  5. Cholestyramine.  6. Coumadin 5.  7. Claritin 10.   PHYSICAL EXAM:  Blood pressure 127/69, pulse 52, sinus bradycardia.  GENERAL APPEARANCE:  Normal.  JVP not elevated.  Carotid pulses palpable and equal without bruits.  LUNGS:  Clear.  CARDIAC:  No murmur, gallop, or rub.  ABDOMEN:  Slight pulsation.  Otherwise, unremarkable.  EXTREMITIES:  Normal.   ELECTROCARDIOGRAM:  Reveals sinus bradycardia, rate of 52, first degree  AV block, anterior hemiblock, probable old anterior septal MI.   IMPRESSION:  Diagnosis as above.  The patient is quite stable at this  time.  I have considered changing him to Coreg rather than Toprol.  However, the  patient states that he tried Coreg previously and developed  some swelling of his lipids.   Therefore, we plan to continue on the same therapy.  He is to see Dr.  Graciela Husbands in 3 months.  We will plan a BMP, lipids, and LFTs in a few weeks.   The patient will be followed by Dr. Graciela Husbands for his general cardiac care,  as well as ICD.     Cecil Cranker, MD, [facility]  Electronically Signed    EJL/MedQ  DD: 06/28/2006  DT: 06/28/2006  Job #: [id number]

## 2010-06-29 NOTE — Assessment & Plan Note (Signed)
[facility]                         ELECTROPHYSIOLOGY OFFICE NOTE   [name], [name]                          MRN:          [id number]  DATE:10/17/2007                            DOB:          06-Jul-1926    [name] comes in having been started on Tikosyn at the end of June.  We  saw him couple of weeks later and he was still having some atrial  fibrillation.  He comes in now having had no symptomatic atrial  fibrillation and feeling much better.  He continues to complain of  exercise intolerance with climbing stairs and walking too vigorously,  but he is able to do 80-90% of his ADLs without too much challenge.  He  is working out at [employer] [facility] well as [facility].  We discussed cardiac rehab  and he declined.   He has had no intercurrent chest pain.   CURRENT MEDICATIONS:  1. Aspirin 81.  2. Tikosyn 125 q.12 h.  3. Coumadin.  4. Benazepril 10.  5. Metoprolol 25.   PHYSICAL EXAMINATION:  VITAL SIGNS:  Today, his blood pressure is  116/70, his pulse was 68, and his weight was 202, which is stable.  NECK:  Veins were flat.  LUNGS:  Clear.  HEART:  Sounds were regular with an S4.  EXTREMITIES:  No edema.   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   IMPRESSION:  1. Paroxysmal atrial fibrillation.  2. Tikosyn therapy for paroxysmal atrial fibrillation.  3. Ischemic heart disease with prior bypass surgery and ejection      fraction of 25-30%.  4. Congestive heart failure - chronic - systolic.   [name] is actually doing extremely well.  I am very pleased to see  with lack of symptomatic recurrences of atrial fibrillation.   We will check his potassium and magnesium today given his Tikosyn  therapy.  We will continue his other medications as scheduled.  We will  see him again in 6 months' time.     Duke Salvia, MD, [facility]  Electronically Signed    SCK/MedQ  DD: 10/17/2007  DT: 10/18/2007  Job #: [id number]

## 2010-06-29 NOTE — Progress Notes (Signed)
HPI  [name] is a 77 y.o. male seen in followup for ischemic heart disease with prior bypass surgery and history of atrial fibrillation. He is status post ICD implantation for primary prevention. He is approaching ERI. He ahs a T135 in place  Is intercurrently undergone stent graft repair of his aortic stent graft  The patient denies chest pain, shortness of breath, nocturnal dyspnea, orthopnea or peripheral edema.  There have been no palpitations, lightheadedness or syncope.    Past Medical History  Diagnosis Date  . Arrhythmia     AFIB  . Coronary artery disease   . Other specified forms of chronic ischemic heart disease   . Abdominal aneurysm without mention of rupture   . CHF (congestive heart failure)     SYSTOLIC .Marland KitchenACUTE ON CHRONIC  . Hypertension   . Peripheral vascular disease, unspecified   . Hemoptysis   . Acute prostatitis   . Labyrinthitis, unspecified   . Unspecified fall   . Unspecified hearing loss   . Cervicalgia   . Personal history of other diseases of digestive system   . Stroke     Past Surgical History  Procedure Date  . Insert / replace / remove pacemaker 2006    AICD GUIDANT VITALITY..IN SITU  . Colectomy 07/15/96  . Colostomy 07/15/96    COLOSTOMY TAKEN DOWN 01/14/97  . Coronary artery bypass graft 03/22/89  . Cardiac catheterization 2001    ANGIOPLASTY W/STENT RCA  . Abdominal aortic aneurysm repair 05/1999    STENT, ENDOVASCULAR RELINING OF GRAFT 3/11  . Cardioversion     Current Outpatient Prescriptions  Medication Sig Dispense Refill  . acetaminophen (TYLENOL) 500 MG tablet Take 1,000 mg by mouth 2 (two) times daily.        Marland Kitchen aspirin 81 MG EC tablet Take 81 mg by mouth daily.        . benazepril (LOTENSIN) 20 MG tablet Take 20 mg by mouth daily.        . Calcium Carbonate-Vit D-Min 600-400 MG-UNIT TABS Take 1 tablet by mouth daily.        . diclofenac sodium (VOLTAREN) 1 % GEL Apply topically. APPLY 4 GRAMS TO AFFECTED AREA TWO TIMES A DAY        . dofetilide (TIKOSYN) 125 MCG capsule Take 125 mcg by mouth 2 (two) times daily.        . fish oil-omega-3 fatty acids 1000 MG capsule Take 1 g by mouth daily.        . Flaxseed, Linseed, (FLAXSEED OIL) 1200 MG CAPS Take 1 capsule by mouth daily.        . furosemide (LASIX) 40 MG tablet Take 40 mg by mouth daily.        . Magnesium Oxide 500 MG TABS Take 1 tablet by mouth daily.        . Multiple Vitamin (MULTIVITAMIN) tablet Take 1 tablet by mouth daily.        . nitroGLYCERIN (NITROSTAT) 0.4 MG SL tablet Place 0.4 mg under the tongue every 5 (five) minutes as needed.        . potassium chloride (KLOR-CON) 10 MEQ CR tablet Take 10 mEq by mouth daily. 6 days a week      . simvastatin (ZOCOR) 40 MG tablet Take 40 mg by mouth at bedtime.        Marland Kitchen warfarin (COUMADIN) 5 MG tablet Take by mouth as directed.          Allergies  Allergen Reactions  . Promethazine Hcl     REACTION: world spins..    Review of Systems negative except from HPI and PMH  Physical Exam Well developed and well nourished in no acute distress HENT normal E scleral and icterus clear Neck Supple JVP flat; carotids brisk and full Clear to ausculation Regular rate and rhythm, no murmurs gallops or rub Soft with active bowel sounds No clubbing cyanosis and edema Alert and oriented, grossly normal motor and sensory function Skin Warm and Dry    Assessment and  Plan

## 2010-06-29 NOTE — Assessment & Plan Note (Signed)
[facility]                         ELECTROPHYSIOLOGY OFFICE NOTE   LAMEL, MCCARLEY                          MRN:          [id number]  DATE:05/01/2007                            DOB:          August 01, 1926    Mr. Jovel was cardioverted a couple weeks ago at the hospital for his  atrial fibrillation.  We restored sinus rhythm.  He comes in today  thinking that he is in regular rhythm, his exercise tolerance is  improved.   He has no complaints of chest pain or shortness of breath.   CURRENT MEDICATIONS:  1. Diltiazem 120.  2. Toprol 25.  3. Benazepril.  4. Coumadin.  5. Aspirin.  6. Simvastatin.   PHYSICAL EXAMINATION:  His blood pressure was 104/64, his pulse was 64  and irregular.  LUNGS:  Clear.  HEART:  His heart sounds were irregular as noted with an early systolic  murmur.  ABDOMEN:  Soft.  EXTREMITIES:  Without edema.   Electrocardiogram dated today demonstrated atrial fibrillation at a rate  of 61 with intervals of __________ axis was leftward at -37.   IMPRESSION:  1. Paroxysmal atrial fibrillation - recurrent. Now will likely be      persistent/permanent.  2. Mostly adequate rate control.  3. Ischemic cardiomyopathy with:      a.     Prior bypass.      b.     Ejection fraction of 25%.  4. Prior thromboembolism on Coumadin.   Mr. Kemppainen is doing really very well.  He is fairly well rate controlled  and is largely asymptomatic.  We will plan to increase his Toprol from  25-50 mg a day and will see him again in three months' time.     Duke Salvia, MD, [facility]  Electronically Signed    SCK/MedQ  DD: 05/01/2007  DT: 05/01/2007  Job #: [id number]

## 2010-06-29 NOTE — H&P (Signed)
[name], YONKER NO.:  [id number]   MEDICAL RECORD NO.:  [id number]          PATIENT TYPE:  INP   LOCATION:  2028                         FACILITY:  [facility]   PHYSICIAN:  Duke Salvia, MD, FACCDATE OF BIRTH:  1926/12/02   DATE OF ADMISSION:  08/14/2007  DATE OF DISCHARGE:                              HISTORY & PHYSICAL   PRIMARY CARDIOLOGIST:  Duke Salvia, MD, [facility]   PRIMARY CARE PHYSICIAN:  Rosalyn Gess. Norins, MD   CHIEF COMPLAINT:  Atrial fibrillation.   HISTORY OF PRESENT ILLNESS:  Mr. [name] is a 77 year old male with a  history of atrial fibrillation and left ventricular dysfunction.  He was  evaluated by Dr. Graciela Husbands in early June 2009, and it was felt that he  needed to be off his Cardizem which was done.  He was still not feeling  well after the Cardizem was discontinued and Dr. Graciela Husbands felt that he  would benefit from maintaining sinus rhythm.  He was scheduled for  Tikosyn loading and is here today for admission for the initiation of  Tikosyn therapy.  Currently, he is resting comfortably and is having no  chest pain or shortness of breath.   PAST MEDICAL HISTORY:  1. Atrial fibrillation status post cardioversion in February 2009 with      recurrence.  2. Ischemic cardiomyopathy with an EF of 25%.  3. History of thromboembolism on chronic Coumadin therapy.  4. Status post TIA.  5. History of abdominal aortic aneurysm status post stent grafting.  6. History of atrial flutter and its ablation.  7. Status post aortocoronary bypass surgery in 1991 with LIMA, 2 LAD,      SVG to OM, SVG to diagonal, SVG to PDA.  8. History of hyperlipidemia.  9. Ischemic colitis.  10.Hypertension.  11.BPH.  12.Right lower lobe lung nodule.  13.Hard of hearing.  14.Status post colectomy  15.Status post cardiac catheterization in February 2001 with a bare      metal stent to be proximal RCA, all grafts patent.  16.Status post single chamber Guidant ICD in  2005.   SURGICAL HISTORY:  He is status post cardiac catheterizations as well as  ICD and AAA repair, and atrial flutter ablation.   ALLERGIES:  PHENERGAN.   CURRENT MEDICATIONS:  1. Aspirin 81 mg daily.  2. Simvastatin 40 mg one-half tablet daily.  3. Metoprolol ER 50 mg one-half tablet daily.  4. Benazepril 20 mg one-half tablet daily.  5. Coumadin 5 mg 4 days a week with 2.5 mg on Monday, Wednesday, and      Friday.  6. Multivitamin daily.  7. Tylenol 500 mg q.i.d. p.r.n.   SOCIAL HISTORY:  [place].  He is a retired Forensic scientist.  He has no history of alcohol,  tobacco or drug abuse.   FAMILY HISTORY:  His father died in 37 of a heart attack.  He was in  his late 38s or early 95s at that time.  His mother died at 98 with no  known coronary artery  disease.  He has 1 brother who has had 2 heart  attacks and is in his early 62s.   REVIEW OF SYSTEMS:  He has no history of fevers or chills.  He had a  urinary tract infection in January, but this has resolved and he is  having no further problems.  His weight has been stable.  He denies  coughing or wheezing.  He has some weakness from the atrial fibrillation  and fatigue.  There has been no hematemesis, hemoptysis, or melena.  Full 14-point review of systems is otherwise negative.   PHYSICAL EXAMINATION:  VITAL SIGNS:  Temperature is 97.8, blood pressure  125/83, pulse 89, respiratory rate 20, and O2 saturation 96% on room  air.  GENERAL:  He is a well-developed, well-nourished white male in no acute  distress.  HEENT:  Normal.  NECK:  There is no JVD, no thyromegaly, and no carotid bruits are  appreciated.  LUNGS:  Essentially clear to auscultation bilaterally.  CV:  Heart is irregular in rate and rhythm with an S1 and S2 noted with  no obvious murmur, rub or gallop noted.  ABDOMEN:  Soft and nontender with active bowel sounds.  SKIN:  No rashes or lesions are noted.   EXTREMITIES:  He has bilateral lower extremity edema, left greater than  right with pulses intact.  MUSCULOSKELETAL:  There is no joint deformity or effusions.  NEURO:  He is alert and oriented with cranial nerves II through XII  grossly intact.   LABORATORY DATA:  Laboratory values from August 13, 2007 show magnesium of  2.2.  Sodium 138, potassium 4.3, chloride 106, BUN 17, creatinine 1.3,  glucose 122, and CO2 27.  Other labs are pending.   IMPRESSION:  Persistent atrial fibrillation:  He is here today for  Tikosyn loading.  His EKG and labs will be followed closely.  His  Coumadin has been documented his therapeutic.  This will be followed  closely as well.  He is tentatively scheduled for cardioversion on  Thursday, if needed.  He will be continued on his other home  medications.      Theodore Demark, PA-C      Duke Salvia, MD, [facility]  Electronically Signed    RB/MEDQ  D:  08/14/2007  T:  08/15/2007  Job:  [id number]

## 2010-06-29 NOTE — Op Note (Signed)
NAMEEDDY, LISZEWSKI NO.:  [id number]   MEDICAL RECORD NO.:  [id number]          PATIENT TYPE:  INP   LOCATION:  2924                         FACILITY:  [facility]   PHYSICIAN:  Doylene Canning. Ladona Ridgel, MD    DATE OF BIRTH:  May 02, 1926   DATE OF PROCEDURE:  03/06/2008  DATE OF DISCHARGE:                               OPERATIVE REPORT   PROCEDURE PERFORMED:  Direct current cardioversion.   INDICATIONS:  Symptomatic atrial fibrillation.   INTRODUCTION:  The patient is a 77 year old male with known AFib who has  been in the hospital and went into AFib earlier this morning.  He is now  referred for DC cardioversion.  His INRs have been therapeutic.   PROCEDURE IN DETAIL:  After informed consent was obtained, the patient  was prepped in the usual manner.  The electrodispersive pad was placed  in the anterior-posterior position.  He was sedated with a total of 4 mg  of Versed and 37.5 mcg of fentanyl.  He was cardioverted with 200 joules  of synchronized biphasic energy restoring sinus rhythm.  The patient  tolerated the procedure well.  His oxygen saturations remained in the  mid 90s during the procedure.   COMPLICATIONS:  There were no immediate procedure complications.   RESULTS:  This demonstrated successful direct current cardioversion in a  patient with symptomatic atrial fibrillation.      Doylene Canning. Ladona Ridgel, MD  Electronically Signed     GWT/MEDQ  D:  03/06/2008  T:  03/07/2008  Job:  [id number]

## 2010-06-29 NOTE — H&P (Signed)
NAMEDELWYN, SCOGGIN NO.:  [id number]   MEDICAL RECORD NO.:  [id number]          PATIENT TYPE:  INP   LOCATION:  0101                         FACILITY:  [facility]   PHYSICIAN:  Raenette Rover. Felicity Coyer, MDDATE OF BIRTH:  08-13-1926   DATE OF ADMISSION:  03/12/2007  DATE OF DISCHARGE:                              HISTORY & PHYSICAL   PRIMARY CARE PHYSICIAN:  Rosalyn Gess. Norins, MD.   CARDIOLOGIST:  Duke Salvia, MD, [facility]   CHIEF COMPLAINT:  Weakness with fever x6 days.   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  He was treated at the time for acute  prostatitis with Septra and Flomax prescriptions.  He felt somewhat  improved but then with recurrent fever 24 hours after that persisted  through the weekend. this was associated with increasing weakness  including two falls at home with dizziness and inability to get up.  Fevers have ranged 101-102 at home.  There was blood in his urine at the  time of the outpatient visit which has since resolved.  He has had no  further pain with urination, no flank pain. He has had anorexia with  decreased intake for the last 4 days associated with mild nausea but no  specific abdominal pain.  No headache, chest pain, shortness of breath.  No vomiting or diarrhea. Has been unable to take his home medications  due to his fatigue and weakness in the last 3 days.  He denies back  pain, change in his chronic neck pain.  No joint pain or joint swelling.  No cough, no sore throat.  No travel.  No shortness of breath or lower  extremity swelling.  No rashes.  No new medications except Flomax and  antibiotics. No sick contacts.  No weight loss. No lymph tenderness or  swelling.   PAST MEDICAL HISTORY:  Significant for:  1. Ischemic  cardiomyopathy with an LVEF of 25% status post ICD in      2005.  Reportedly on chronic Coumadin due to this low EF.  2. Coronary disease status post CABG x5 in 1991 with stent to RCA in      2001.  3. AAA graft repair in 2001 followed by Dr. Madilyn Fireman.  4. History of atrial flutter status post ablation in September 2006,      denies history of atrial fibrillation.  5. Hypertension.  6. Dyslipidemia.  7. History of ischemic colitis status post colectomy in 1990.  8. History of TIA and stroke. Remains on chronic anticoagulation as      previously noted.  9. Obstructive sleep apnea.  10.Chronic neck pain.   MEDICATIONS AT HOME:  1. Aspirin 81 mg daily.  2. Zocor 20 mg daily.  3. Toprol XL 25 mg daily.  4. Benazepril 10 mg daily.  5. Cholestyramine 4 grams p.o. daily.  6. Coumadin 5 mg daily except 2.5 on Wednesdays.  7. Loratadine 10 mg daily.  8. CholestOff 915 mg daily.  9. Fish oil 1000 mg daily.  10.Meclizine 25 mg 3 times a day p.r.n.  11.Multivitamin.  12.Septra DS b.i.d. since January 22.  13.Flomax 0.4 mg daily since January 22.   ALLERGIES:  Include PHENERGAN makes the world spin.   FAMILY HISTORY:  Noncontributory to this admission.   SOCIAL HISTORY:  He lives at home with his wife.  He is a retired  Forensic scientist.  Three grown children.  No tobacco.  Occasional  alcohol use.   REVIEW OF SYSTEMS:  Please see above as stated in HPI. Otherwise 12-  system review negative except as listed.   PHYSICAL EXAMINATION:  VITAL SIGNS:  Temperature 101.8, blood pressure  129/73 heart rate 75, respirations 18. He is saturating 99% on room air.  GENERAL:  He is an ill-appearing white gentleman with a wet cloth over  his forehead, slightly ashen but in no acute distress.  His wife is at  bedside.  HEENT:  PERRLA.  EOMI.  Dry mucous membranes without scleral icterus or  injection.  NECK:  Supple without appreciable lymphadenopathy.  Also no  lymphadenopathy within the  axillary region.  CARDIOVASCULAR:  Exam is irregular without appreciable murmurs, rate-  controlled normal S1-S2.  CHEST:  Lungs ae clear to auscultation bilaterally.  No increased work  of breathing.  No wheeze, rhonchi or crackle.  No tenderness to  palpation.  ABDOMEN:  Soft and minimally protuberant, nontender, nondistended.  Good  bowel sounds.  No appreciable hepatosplenomegaly.  EXTREMITIES:  Show no swelling or edema bilaterally.  No ulcerations.  NEUROLOGIC:  He is awake, alert, oriented x3 without focal motor,  sensory or cognitive deficits.  SKIN:  Clammy without suspicious rash lesion identified.   LABORATORY DATA:  CBC with white count 8.5, 89% neutrophils, hemoglobin  normal at 13.5, platelets 108.  Basic metabolic shows sodium 129,  glucose 127, but otherwise unremarkable.  Fecal occult blood negative in  the ED.  Influenza A and B swab negative. Urinalysis with trace  leukocyte esterase and negative microscopic.   EKG shows rapid atrial fibrillation with a rate of 104, no ST or T  changes.   Chest x-ray shows small bilateral pleural effusions with interstitial  edema, mild but no airspace disease   ASSESSMENT/PLAN:  1. Fever, question unknown origin.  Normal white count but mild      thrombocytopenia.  Question partially treated prostatitis. His      urinary symptoms have resolved, but fever persists. Will continue      empiric treatment with antibiotics using Rocephin at this time.      Check blood cultures though suspects sterile status post previous      p.o. with now IV antibiotics. Will check PSA and follow up urine      culture. Check LFTs secondary to nausea and follow CBC for white      count and platelet trend. Will also check a CT abdomen and pelvis      with contrast to rule out diverticulitis, intra-abdominal abscess,      inflammatory changes with previous surgeries, or any      lymphadenopathy that may suggest lymphoma.  If CT abdomen and      pelvis  is negative, would also consider CT chest angiogram to rule      out PE and evaluate his lung parenchyma especially with a      subtherapeutic INR. Admit as ordered with further  clinical      observation for other symptoms.  Question need for infectious      disease and/or urology evaluation depending on these results  2. New onset atrial fibrillation with rapid ventricular response.      This is most likely due to clinical illness in the setting of fever      as described above. Status post IV diltiazem 10 mg x1 in the ED and      is now rate controlled 70s to 90s but remains irregular. Will check      cardiac enzymes now and in the morning. Continue telemonitoring.      Check 2-D echocardiogram, especially with history of ischemic      cardiomyopathy.  Check TSH.  Will bridge with full-dose Lovenox      until INR is greater than 2.0, especially with question of PE.      Please see problem above. May or may not need cardiology evaluation      depending on progress. No anginal symptoms at this time.  EKG is      without obvious acute ischemic changes but remains irregularly in      atrial fibrillation as documented on his 9:30 a.m. EKG. Continue      home beta blocker as tolerated.  3. Mild hyponatremia, likely due to dehydration and decreased intake.      Gentle IV fluid with normal saline and recheck in morning.  4. Mild random hyperglycemia.  No history of type 2 diabetes. Will      check A1c and if elevated persists with sliding scale insulin and      diabetic evaluation. May be reactive to suspected infection.  5. Chronic systolic heart failure secondary to ischemic      cardiomyopathy.  Appears stable with volume compensation at this      time. Monitor with IV hydration as noted above. 2-D echocardiogram      as previously noted.  Continue home medications. Note again status      post ICD placement 2005.  6. History of coronary disease status coronary artery bypass grafting      in  1991 with stent in 2001.  Serial enzymes now and in morning.  No      cardiac complaints. See problem #2 above.  7. Dyslipidemia.  Continue home statin  but hold Questran.      Outpatient followup.  8. Hypertension. Hemodynamically stable.  Continue ACE inhibitor, beta      blocker and other home medications.  9. History of transient ischemic attack and cerebrovascular accident      on chronic anticoagulation, currently stable without neurologic      complaints. See section on atrial fibrillation regarding      anticoagulation in the setting of atrial fibrillation.      Valerie A. Felicity Coyer, MD  Electronically Signed     VAL/MEDQ  D:  03/12/2007  T:  03/12/2007  Job:  [id number]

## 2010-06-29 NOTE — Assessment & Plan Note (Signed)
A 77 year old male with chronic neck pain.  CT of the neck demonstrated  significant cervical spondylosis.  Pain increases with turning his head  side-to-side, but at this point, he has no rest pain.  We would send him  to physical therapy for TENS and traction.  He has been going.  In  addition, trialed some Voltaren gel.  He states that his protime and INR  have been elevated since starting this.  He denies any other new  medication changes or dietary changes.  His pain is down to about 1/10.  His sleep is good.  His relief from meds is good.  He can walk 20-25  minutes at a time, but this is not limited by his pain, but rather by  his heart.  He can climb steps.  He can drive.  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   REVIEW OF SYSTEMS:  He has had review of systems positive for easy  bleeding, shortness of breath.   PAST MEDICAL HISTORY:  Heart disease, stroke, cardiac arrhythmia, Afib.   SOCIAL HISTORY:  Married, lives with his wife.   FAMILY HISTORY:  Heart disease.   PHYSICAL EXAMINATION:  VITAL SIGNS:  Blood pressure is 133/62, pulse 74,  respirations 18, O2 sat 98% on room air.  GENERAL:  Thin, elderly male in no acute distress.  Orientation x3.  Affect is bright, alert.  Gait is normal.  EXTREMITIES:  Without edema.  NECK:  Range of motion is 50% lateral bending, forward flexion, and  extension.  He has negative Spurling maneuver.  EXTREMITIES:  Upper extremity strength is normal.  Upper extremity deep  tendon reflexes are reduced on the right side, but normal on the left  side.  Shoulders have negative impingement signs.   IMPRESSION:  Cervical spondylosis without evidence of radiculopathy or  myelopathy.   PLAN:  1. We will continue his Voltaren gel.  2. Continue physical therapy.  3. I will see him back in about 2 months.  He will have finished up      with physical therapy by then, and at that point, he should be      independently performing  his home exercise program.      Erick Colace, M.D.  Electronically Signed     AEK/MedQ  D:  02/22/2008 16:17:51  T:  02/23/2008 05:32:44  Job #:  [id number]   cc:   Rosalyn Gess. Norins, MD  [address]  [place]  [place] [address]   Duke Salvia, MD, [facility]  [address]  [address]  [place]  [place] [address]

## 2010-06-29 NOTE — Discharge Summary (Signed)
NAMEDILLARD, [name] NO.:  [id number]   MEDICAL RECORD NO.:  [id number]           PATIENT TYPE:   LOCATION:                                 FACILITY:   PHYSICIAN:  Duke Salvia, MD, FACCDATE OF BIRTH:  1926/05/12   DATE OF ADMISSION:  DATE OF DISCHARGE:                               DISCHARGE SUMMARY   PROCEDURES:  1. Direct current cardioversion.  2. A 2-D echocardiogram.   PRIMARY FINAL DISCHARGE DIAGNOSIS:  Symptomatic atrial fibrillation.   SECONDARY DIAGNOSES:  1. Acute on chronic systolic congestive heart failure.  2. Ischemic cardiomyopathy with an EF of 25% by echocardiogram this      admission.  3. Status post aortocoronary bypass surgery in 1991 with saphenous      vein graft to diagonal, saphenous vein graft to right coronary      artery, left internal mammary artery to left anterior descending      (atretic at cath in 2005).  4. History of an abdominal aortic aneurysm with endovascular repair.  5. Peripheral vascular disease.  6. History of transient ischemic attacks and cerebrovascular accident.  7. Hyperlipidemia.  8. Status post Guidant single-chamber defibrillator in August 2005.   TIME AT DISCHARGE:  41 minutes.   HOSPITAL COURSE:  Mr. [name] is a 77 year old male with a history of  atrial fibrillation.  He came to the hospital on the day of admission  with dyspnea and shortness of breath and was found to have pleural  effusions.  He was admitted for further evaluation.   Mr. [name] was diuresed with IV Lasix and then transitioned to p.o.  He is  being discharged on p.o. Lasix and will be followed closely as an  outpatient.  It was felt that the atrial fib was contributing some to  his heart failure and he was not tolerating it well.  His INRs were  confirmed to have been therapeutic and then he was scheduled for  cardioversion which was performed on March 06, 2008.  He was  cardioverted with 200 joules of synchronized biphasic  energy.  Postprocedure, he was maintaining sinus rhythm.   Mr. [name] was also felt to have an upper respiratory infection.  He had  been on Avelox prior to admission and this was changed to Augmentin.  He  is to continue Augmentin after discharge for 1 week and be followed  closely in the Coumadin Clinic.   By March 07, 2008, Mr. [name]'s O2 saturations were much improved.  He  was ambulating without chest pain or shortness of breath and maintaining  sinus rhythm.  He was evaluated by Dr. Graciela Husbands and considered stable for  discharge with close outpatient followup.   DISCHARGE INSTRUCTIONS:  His activity level is to be increased  gradually.  He is to stick to a low-sodium, heart-healthy diet and weigh  himself daily.  He is to follow up with the Coumadin Clinic on  Wednesday, March 12, 2008, at 9:30, and with Dr. Graciela Husbands on March 24, 2008, at 2:15.  He is to get a BMET when  he comes to the Coumadin  Clinic.  He is to follow up with Dr. Debby Bud as needed.   DISCHARGE MEDICATIONS:  1. Tikosyn 125 mcg every 12 hours.  2. Aspirin 81 mg daily.  3. Zocor 20 mg daily.  4. Coumadin 5 mg daily.  5. Benazepril 20 mg daily.  6. Fish oil daily.  7. Avelox is discontinued.  8. Metoprolol 25 mg daily is on hold.  9. Furosemide 40 mg daily.  10.K-Dur 10 mEq daily.  11.Augmentin 500 mg b.i.d. for 7 days.      Theodore Demark, PA-C      Duke Salvia, MD, [facility]  Electronically Signed    RB/MEDQ  D:  03/07/2008  T:  03/07/2008  Job:  [id number] [id number]   cc:   Rosalyn Gess. Norins, MD

## 2010-06-29 NOTE — Assessment & Plan Note (Signed)
OFFICE VISIT   [name], [name]  DOB:  [birth year]                                       01/19/2009  ZOXWR#:[id number]   The patient returns today having undergone endovascular repair of  abdominal aortic aneurysm in 2001 with AneuRx device performed by Dr.  Madilyn Fireman.  He had a type 2 endoleak which on his scan in 2009 appeared to  resolve.  His aneurysm has increased in size and now measures 5.5 x 5.9.  There is no obvious leak identified.  The patient does not have any  abdominal pain.   The patient has a history of having a heart attack in 1991, has not had  any chest pain since.  He continues to be a nonsmoker.   REVIEW OF SYSTEMS:  Positive for atrial fibrillation on Coumadin, has a  history of mini stroke, joint pain and eczema in his leg.  All other  review systems negative as documented in the encounter form.   PHYSICAL EXAMINATION:  Vital signs:  Heart rate 54.  Blood pressure  144/79, temperature 98.0.  General:  Well-appearing, no distress.  Head:  Normocephalic, atraumatic.  EENT:  Pupils equal.  Sclerae anicteric.  Lungs are clear bilaterally.  Cardiovascular:  Regular rate and rhythm.  Abdomen:  Soft, nontender.  No pulsatile mass identified.  Musculoskeletal:  No major deformities.  Neuro:  No focal weakness.  Skin is without rash.   DIAGNOSTIC STUDIES:  I have independently reviewed his CT scan.  I do  not see a type 2 endoleak.  However, his aneurysm size has increased to  5.5 x 5.9.   ASSESSMENT/PLAN:  Increasing aneurysm status post stent graft.   PLAN:  I discussed the findings on the CAT scan with the patient.  I  told him this either represents a type 2 endoleak or endotension.  I  would like to sit down with radiology and review all of his scans for  the past several years to help determine the etiology for his increasing  aneurysm size.  If this turns out to be endotension we discussed  proceeding with an aortounifemoral graft and  femoral-femoral bypass to  exclude his aneurysm.  I will contact the patient regarding our plans.   Jorge Ny, MD  Electronically Signed   VWB/MEDQ  D:  01/19/2009  T:  01/20/2009  Job:  [id number]   cc:   Duke Salvia, MD, [facility]  Rosalyn Gess. Norins, MD

## 2010-06-29 NOTE — Discharge Summary (Signed)
[name], [name] NO.:  [id number]   MEDICAL RECORD NO.:  [id number]          PATIENT TYPE:  INP   LOCATION:  1424                         FACILITY:  [facility]   PHYSICIAN:  Rosalyn Gess. Norins, MD  DATE OF BIRTH:  1926/08/29   DATE OF ADMISSION:  03/12/2007  DATE OF DISCHARGE:  03/14/2007                               DISCHARGE SUMMARY   ADMITTING DIAGNOSES:  1. Fever of unknown origin.  2. New onset atrial fibrillation with rapid ventricular response in      the setting of a patient with atrial flutter previously treated      with radiofrequency ablation.   DISCHARGE DIAGNOSES:  1. Fever of unknown origin.  2. New onset atrial fibrillation with rapid ventricular response in      the setting of a patient with atrial flutter previously treated      with radiofrequency ablation.   HISTORY OF PRESENT ILLNESS:  [name] is a delightful 77 year old  gentleman who I saw in the office January 22 with fever, weakness, and  burning with urination as well as hematuria.  He was diagnosed at the  time with acute prostatitis, and started on Septra DS b.i.d. and Flomax  0.4 mg daily.  The patient reports he seemed to be improved with  resolution of hematuria and dysuria.  However, he had increasing  weakness with two falls at home, dizziness with standing.  Fevers at  home ranged from 101 to 102.  He had poor p.o. intake and was mildly  anorexic.  He denied any headache, chest pain or shortness of breath.  He had no vomiting or diarrhea.  He had continued on all of his home  medications.  On presentation to the emergency department the patient  was found be in atrial fibrillation with a rapid ventricular response.  With the patient's fever as well as his atrial fibrillation he was  admitted to hospital for further evaluation and treatment.  Please see  the H&P for Past Medical History, Current Medications at Home, Family  History, and Social History.   ADMITTING PHYSICAL  EXAMINATION:  VITAL SIGNS:  Temperature of 101.8,  blood pressure 129/73, heart rate was 75 on the report, O2 sat was 99%  on room air.  LUNGS:  Clear bilaterally with no increased work of breathing.  ABDOMEN:  Soft, minimally protuberant with good bowel sounds with no  appreciable organosplenomegaly.  NEUROLOGIC:  Nonfocal.   ADMISSION LABORATORY:  White count of 8500 with 89% segmented  neutrophils, hemoglobin was 13.5 g.  Metabolic panel revealed sodium  129, glucose 127; otherwise, unremarkable.   EKG showed rapid atrial fibrillation with a rate of 104.  Chest x-ray  showed small bilateral pleural effusions with interstitial edema, but no  airspace disease was noted.   HOSPITAL COURSE:  1. Fever of unknown origin.  The patient with probable prostatitis.      He did continue to have fever.  He was admitted to hospital.  Blood      cultures were drawn 12-14 hours late, and are pending at time  of      discharge dictation.  Urinalysis was negative.  White count      remained down.  CT scan of the abdomen and pelvis was performed and      was unremarkable.  The patient continued to do well and was      afebrile for the last 24 hours with T-max of 99.3 on the night of      January 27, but was 97.8 yesterday morning and has remained down.      The patient has not required any Tylenol for fever.  Most likely      cause of the patient's fevers and discomfort was prostatitis.  It      had not yet responded to medical regimen.  He did very well with      Rocephin 1 g IV q.24.  Plan will be to discharge the patient home      on cefuroxime axetil 250 mg b.i.d. for an additional 10 days.  2. Elevated PSA.  The patient had an elevated PSA 15.3.  Reviewed his      past record and his last PSA in 2007 was 0.81.  Digital rectal exam      was not performed during this hospitalization.  Suspect the      elevation in PSA is related to acute infection.  Plan:  The patient      will continue his  antibiotic regimen as noted.  Will get a repeat      PSA in 4-6 weeks.  The patient will have a prostate examination as      an outpatient.  3. Cardiovascular:  Patient with history of atrial flutter that had      been successfully ablated.  He now presents with atrial      fibrillation with rapid ventricular response.  He was given a      single dose of Cardizem in the emergency department.  He has been      continued on beta blocker.  His heart rate has been reasonably well-      controlled, but he remains in an irregular irregular rhythm.  The      patient has been started on Coumadin.  Plan:  The patient will be      discharged home on his home medications including Toprol XL.  Will      add Cardizem CD 120 mg daily as well for rate control.  The patient      will be scheduled to see Dr. Berton Mount in follow-up.  He will be      scheduled for the coag clinic for monitoring of his INR with a      first visit to be this coming Friday, and he will continue on      Coumadin 5 mg daily until that time.  Lovenox will be discontinued      with the patient's INR being 1.3.  Of note the patient had a 2-D      echo that showed an ejection fraction of 15-20% which is unchanged      from previous studies with severe diffuse left ventricular      hypokinesis and mild dilation, trivial AR, mild aortic root      dilation.  A pacemaker is in the right ventricle.   DISCHARGE PHYSICAL EXAMINATION:  VITAL SIGNS:  Temperature 98.7, blood  pressure 136/88, heart rate 82, respirations 18, O2 sats 96% on room  air.  The patient's ins  and outs were relatively well-balanced.  GENERAL APPEARANCE:  This is a pleasant 77 year old gentleman sitting in  a chair who looks comfortable in no distress.  HEENT:  Grossly unremarkable.  CHEST:  Patient is moving air well.  I appreciated no rales or wheezes.  CARDIOVASCULAR:  2+ radial pulse.  His precordium was quiet.  He had an  irregular irregular heart rhythm that  was rate controlled.  ABDOMEN:  Soft and nontender.   FINAL LABORATORIES:  Chemistries revealed sodium 137, potassium 4.4,  chloride 104, CO2 27, BUN 11, creatinine 1.17, glucose 95. Hemoglobin 12  g, white count 4200, platelet count 127,000.  INR was 1.3.  TSH was  0.156.  T3 uptake and free T4 pending at time of discharge dictation.  This will be checked as an outpatient.  The patient may be borderline  hyperthyroid.  The patient had an ultrasound of the neck which showed  three small nodules none of which met the criteria for biopsy.  Will  follow this up as an outpatient with appropriate interventions is  indicated.   An extremely pleasant gentleman admitted with fever, atrial fibrillation  who currently is stable.  Will see him in the office in follow-up either  February 5 or February 4.  The patient's condition at time of discharge  dictation is stable and improved.      Rosalyn Gess Norins, MD  Electronically Signed     MEN/MEDQ  D:  03/14/2007  T:  03/14/2007  Job:  [id number]   cc:   [facility]   Duke Salvia, MD, [facility]  [address]  [address]  [place]  [place] [address]

## 2010-06-29 NOTE — Procedures (Signed)
ENDOVASCULAR STENT GRAFT EXAM   INDICATION:  Followup evaluation of abdominal aortic aneurysm repair.   HISTORY:  Aortobiiliac stent graft repair of AAA in 2001.                           DUPLEX EVALUATION   AAA Sac Size:                 4.9 CM AP             5.26 CM TRV  Previous Sac Size:            4.9 CM AP             5.2 CM TRV  Evidence of an endoleak?      No                    No   Velocity Criteria:  Proximal Aorta                47 cm/sec  Proximal Stent Graft          62 cm/sec  Main Body Stent Graft-Mid     69 cm/sec  Right Limb-Proximal           70 cm/sec  Right Limb-Distal             87 cm/sec  Left Limb-Proximal            70 cm/sec  Left Limb-Distal              88 cm/sec  Patent Renal Arteries?        Yes                   Yes   IMPRESSION:  1. Patent Endograft with no evidence of endoleak.  2. Stable abdominal aortic aneurysm sac size.   ___________________________________________  P. Liliane Bade, M.D.   PB/MEDQ  D:  06/21/2007  T:  06/22/2007  Job:  [id number]

## 2010-06-29 NOTE — Assessment & Plan Note (Signed)
The patient's device was interrogated.  The information was reviewed. No changes were made in the programming.    

## 2010-06-29 NOTE — Assessment & Plan Note (Signed)
OFFICE VISIT   [name], [name]  DOB:  [birth year]                                       12/21/2006  JXBJY#:[id number]   Patient is a 77 year old gentleman who had an AneuRx stent graft in  2001.  He returns on a regular basis for surveillance.  Underwent both  CT scan and duplex surveillance of his AAA stent today.   No evidence of endoleak found by CT scan.  Aneurysm size remains stable  at 5.2 cm.  Ultrasound today verifies these findings a with a 5.2 cm  abdominal aortic aneurysm.  There appears to be a possible meandering  mesenteric artery refilling the inferior mesenteric artery but no  evidence of endoleak.  This may represent endotension.   Patient has no complaints at this time.  No abdominal pain.  No back  pain.  He is walking without discomfort.  He has had no recent chest  pain.  Denies shortness of breath.   PHYSICAL EXAMINATION:  He appears generally well in no acute distress.  BP 129/74, pulse 69 per minute, respirations 18 per minute.  His neck  reveals no bruits.  Heart sounds are normal without murmurs.  Chest is  clear bilaterally.  Abdomen is soft and nontender.  Scars from previous  colon surgery are present.  No masses or organomegaly.  Normal bowel  sounds without bruits.  Femoral pulses are 2+ bilaterally.  No ankle  edema.   Patient has evidence of mild enlargement of his AAA sac, which may be  due to endotension.  No evidence of endoleak by CT scan or duplex scan  at this time.  We will plan followup again in six months with repeat  surveillance ultrasound duplex.   Balinda Quails, M.D.  Electronically Signed   PGH/MEDQ  D:  12/21/2006  T:  12/22/2006  Job:  [id number]

## 2010-06-29 NOTE — Assessment & Plan Note (Signed)
Stable and currently functioning class I. We'll decrease his diuretic and his potassium to 3 days a week

## 2010-06-29 NOTE — Assessment & Plan Note (Signed)
[facility]                           PRIMARY CARE OFFICE NOTE   [name], [name]                          MRN:          [id number]  DATE:08/11/2006                            DOB:          1926/11/14    [name] is a 77 year old gentleman who is followed for multiple medical  problems who presents for a follow up evaluation and exam. He was last  seen in primary care on 02/22/06, please see that dictation.   INTERVAL HISTORY:  1. Neuro event. The patient reports that he went on the golf course on      6/10. He had an episode of confusion, and really did not know what      he was doing, taking 5 strokes to get 15 feet onto the green. He      reports no motor weakness. He had no loss of sensation. He does      have a history of atypical migraines with expressive aphagia. The      patient reports that he felt tired and fatigued. He went home and      slept, and felt back to normal when he awoke, although he did have      some loss of energy.  2. Aspiration. The patient reports that he is having problems with      choking which happens about once a week.  3. Derm. The patient has chronic eczema of his feet and would like to      have referral. I did call and get him an appointment for Friday,      August 8, at 9:30am with Dr. Emily Filbert.  4. Human papilloma virus. The patient is concerned about human      papilloma virus and whether he should be treated. I advised him      that although his wife may be problems, if he does have human      papilloma virus there is no active treatment only prevention and it      has not been approved for men.   PAST MEDICAL HISTORY:   SURGICAL:  1. Colectomy in 1998 for a takedown of colostomy.  2. CABG in 1991 with lima to LAD. SVG to diagonal obtuse marginal and      posterior descending.  3. PCI with stenting of the RCA in 2001.  4. Repair of a AAA with stenting in 2001.  5. Radio frequency ablation of atrial  flutter.  6. Implantation of an automatic cardio defibrillator in 2006.   MEDICAL ILLNESSES:  1. Usual childhood disease.  2. Ischemic colitis.  3. CVA with excellent recovery.  4. AAA.  5. Cardiomyopathy with an estimated EF of 20% to 30%.   CURRENT MEDICATIONS:  1. Aspirin 81 mg b.i.d.  2. Zocor 20 mg daily.  3. Toprol XL 25 mg.  4. Fish oil daily.  5. Benazepril 10 mg daily.  6. Multivitamin.  7. Cholestyramine 4 packets daily.  8. Coumadin 5 mg daily as instructed except Tuesdays.  9. Claritin 10 mg daily.  10.Tylenol as needed.  11.CholestOff  daily.   FAMILY HISTORY:  Non-contributory.   SOCIAL HISTORY:  The patient has been remarried for several years. He  and his wife continue to enjoy a good relationship. He continues to play  golf on a regular basis.   REVIEW OF SYSTEMS:  Negative for any constitutional, cardiovascular,  respiratory, GI, or GU problems except as noted above.   PHYSICAL EXAMINATION:  VITAL SIGNS:  Temperature 96.3, blood pressure  134/70, pulse 56, weight 187.  GENERAL:  This is a well developed, well nourished gentleman in no acute  distress.  HEENT: Normocephalic atraumatic, EACs and tympanic membranes were  normal, oropharynx without lesion, posterior pharynx was clear,  conjunctivae and sclerae was clear, pupils equal, round, and reactive to  light and accommodation.  FUNDUSCOPIC:  With a handheld instrument revealed no disc abnormalities,  no exudates and no hemorrhages.  NECK:  Supple. There was thyromegaly. No lymphadenopathy was noted in  the supraclavicular region.  CHEST:  No CVA tenderness.  LUNGS:  Clear to auscultation and percussion.  CARDIOVASCULAR:  2+ radial pulse, no JVD or carotid bruits. He had a  quiet precordium with a regular rate and rhythm with no murmurs, rubs,  or gallops.  ABDOMEN:  Soft, no guarding or rebound, no organo splenomegaly was  appreciated.  RECTAL:  Normal sphincter tone was noted, prostate was  smooth, round,  and normal in size and counter without nodule.  EXTREMITIES:  Without cyanosis, clubbing, or edema. No deformities were  noted.  NEUROLOGIC:  The patient is awake and alert. He is oriented to person,  place, time, and context. His speech is clear. Cognition is normal.  Cranial nerves II-XII reveal normal facial symmetry, normal facial  muscle movement, extraocular muscles were intact, pupils equal, round,  and reactive to light and accommodation. Normal shoulder shrug. No  deviation of tongue or uvula. Muscle strength was 5/5 throughout and  equal. DTRs were 2+ and symmetrical throughout. Cerebellar function was  normal with no tremor. No gait abnormality of other abnormalities noted.   LABORATORY DATA:  From Jun 29, 2006 revealed a basic metabolic panel  that was normal with a glucose of 107. Kidney function was normal with a  BUN of 17, creatinine 1.2, and a GFR of 62. Liver functions were normal.  Cholesterol with a total cholesterol of 151, HDL 36.5, LDL was 84.   ASSESSMENT AND PLAN:  1. Cardiovascular. The patient is stable with no cardiac change or      cardiac event. He was last seen by cardiology on 12/12/05 and was      thought to be stable with recommendation for follow up at any time.      Six months was indicated.  2. Hyperlipidemia. The patient is not quite at goal though was      intolerant of higher doses of Zocor. He is trying CholestOff. He      will need to have a follow up lipid panel to see if he is brought      into compliance with an LDL cholesterol of less than 80.  3. Cerebral vascular disease. The patient has a history of CVA. He is      on aspirin at this time. He is also on Coumadin. The event that he      described as having on the golf course, although worrisome, sounds      somewhat atypical. Clearly, he has had no sequelae and is already      on maximum anticoagulation  therapy. No further evaluation is      indicated at this time. 4.  Health maintenance. The patient's last colonoscopy was 02/20/03 with      follow up recommended in 2012. Last electrophysiology note was      01/25/05. Last stress test Cardiolite study apparently was 07/27/04.      This was read out as trivial anterior wall ischemia which was not      as apparent as on previous studies. His last head CT scan was on      01/07/05 and was normal.   In summary, this is a pleasant patient with medical problems as outlined  above. He does seem medically stable at today's exam. He is asked to  return to see me on a p.r.n. basis.     Rosalyn Gess Norins, MD  Electronically Signed    MEN/MedQ  DD: 08/13/2006  DT: 08/14/2006  Job #: [id number]   cc:   Maylene Roes

## 2010-06-29 NOTE — Assessment & Plan Note (Signed)
[facility]                         ELECTROPHYSIOLOGY OFFICE NOTE   ACY, ORSAK                          MRN:          [id number]  DATE:08/22/2007                            DOB:          1926-10-29    Mr. Haggart is seen following recent hospitalization for the initiation of  Tikosyn.  Given his age and creatinine clearance, he started 250 mcg and  because of QT prolongation, we drop him down to 125 mcg.  He is holding  sinus rhythm.  He feels better in sinus rhythm with more energy.   His other medications are unchanged include metoprolol 25 daily,  warfarin, benazepril 10, and simvastatin.   PHYSICAL EXAMINATION:  VITAL SIGNS:  His blood pressure today was 123/72  with a pulse of 71.  Weight was 197, which is down 4 pounds.  LUNGS:  Clear.  HEART:  Sounds were regular.  EXTREMITIES:  Without edema.   Electrocardiogram demonstrated sinus rhythm at 71 with intervals of  0.2/0.11 with a QT of 0.44, and the QTC of 0.48.   IMPRESSION:  1. Paroxysmal atrial fibrillation, holding sinus rhythm on Tikosyn.  2. Ischemic heart disease with;      a.     Prior bypass surgery.      b.     Ejection fraction of 25-30%.  3. Prior transient ischemic attacks.  4. Previously implanted single chamber ICD for primary prevention      (this is GDT).   Interrogation of the device today demonstrated an R-wave of 5.5 with  impedance of 524 and threshold 0.8 at 0.4.   Mr. Peggs is holding sinus rhythm.  We will check his potassium and  magnesium today because of the issues of arrhythmia with dofetilide.  We  will see him again in 8 weeks' time.     Duke Salvia, MD, [facility]  Electronically Signed    SCK/MedQ  DD: 08/22/2007  DT: 08/22/2007  Job #: [id number]

## 2010-06-29 NOTE — Assessment & Plan Note (Signed)
[facility]                            CARDIOLOGY OFFICE NOTE   [name], [name]                          MRN:          [id number]  DATE:05/16/2007                            DOB:          [birth year]    PRIMARY CARDIOLOGIST:  Dr. Graciela Husbands.   Mr. [name] is a very pleasant 77 year old married white male patient of  Dr. Graciela Husbands who has a history of atrial fibrillation status post ablation.  He developed a viral illness and then went back in atrial fibrillation  and underwent recent cardioversion in February 2009.  When he saw Dr.  Graciela Husbands on May 01, 2007 in followup.  He was found to be back in atrial  fibrillation.  Dr. Graciela Husbands doubled his Toprol from 25 to 50.   The patient comes in today because he has been afraid to resume his  exercise and wanted to make sure everything was okay before he started  his water exercises.  He has been walking about a quarter to half a mile  a day with his wife without any problems.  So far he has tolerated the  increase in the Toprol.  He does complain of fatigue but he cannot tell  whether this is from the atrial fibrillation, Toprol, or lack of  exercise.  He cannot feel his atrial fibrillation and does not know when  his heart is racing.   CURRENT MEDICATIONS:  Aspirin 81 mg daily, benazepril 10 mg daily,  multivitamin with minerals daily, Tylenol 500 two b.i.d., warfarin as  directed, Diltiazem 120 mg daily, metoprolol 50 mg daily, and  simvastatin 40 mg 1/2 daily.   PHYSICAL EXAM:  This is a pleasant 77 year old white male in no acute  distress.  Weight is 200.  Neck is without JVD, HJR bruit or thyroid enlargement.  LUNGS:  Clear anterior posterior lateral.  HEART:  Irregular rate and rhythm at 60 beats per minute normal S1-S2  with the 2/6 systolic ejection murmur at the left sternal border.  ABDOMEN:  Soft without organomegaly, masses, lesions or abnormal  tenderness.  EXTREMITIES:  Without cyanosis or edema is  good distal pulses.   EKG atrial fibrillation at 63 beats per minute, poor R wave progression,  nonspecific ST-T wave changes, no acute change.   IMPRESSION:  1. Recurrent atrial fibrillation and now chronic, rate controlled.  2. Ischemic cardiomyopathy started status post CABG, ejection fraction      25%.  3. History of thromboembolism on Coumadin therapy.  4. Status post ICD for primary prevention.  5. Peripheral vascular disease status post prior TIA and abdominal      aortic aneurysm status post stent grafting.  6. Status post ablation for atrial flutter.   PLAN:  At this time the patient is stable from cardiac standpoint.  I  have told him he could resume his water exercises.  I have asked him to  check heart rate during exercise periodically because he does not know  when it is elevated.  If his fatigue does not improve he is asked to  call and see Dr.  Graciela Husbands sooner than his June appointment.      Jacolyn Reedy, PA-C  Electronically Signed      Duke Salvia, MD, [facility]  Electronically Signed   ML/MedQ  DD: 05/16/2007  DT: 05/16/2007  Job #: [id number]

## 2010-06-29 NOTE — Consult Note (Signed)
CONSULT REQUESTED BY:  Rosalyn Gess. Norins, MD   CONSULT REQUESTED FOR:  The evaluation of neck pain.   CHIEF COMPLAINT:  Pain on the side of the neck as well as pain when  looking upward in the back of the neck.   HISTORY:  A 77 year old male who has a greater than 5-year history of  neck pain which has become progressively worse over the period of last  year.  He indicates that his average pain is 4/10, but currently just  2/10.  It does interfere with activity at a 4/10 level.  Sleep is fair.  The pain is worse with bending his neck backward and feels like he is  hit with a hammer in the back of the neck when he does that.  His pain  does improve with heat as well as medications.  He has tried Tylenol  which helps partially.  He is using capsaicin cream.  His functional  abilities are independent.  He can walk 20-30 minutes at a time and  really is limited by his heart rather than his neck pain.  He is able to  climb steps.  He is able to drive.  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   REVIEW OF SYSTEMS:  Negative except for above.  His Oswestry disability  questionnaire is 24% putting him in the moderate range.   CURRENT MEDICATIONS:  1. Aspirin 81 mg per day.  2. Tikosyn 125 mcg b.i.d.  3. Simvastatin 20 mg per day.  4. Metoprolol 25 mg per day.  5. Benazepril 10 mg per day.  6. Warfarin 5 mg alternating with 2.5 mg.  7. Tylenol 500 mg 4-6 tablets per day.  8. NitroQuick 0.4 sublingual as needed.   PAST SURGICAL HISTORY:  Coronary artery bypass grafting in 1991, surgery  for ischemic colitis in 1998, infrarenal abdominal aortic aneurysm  stenting 2001, cardiac defibrillator placed in 2005.   PAST MEDICAL HISTORY:  Significant for benign prostatic hypertrophy as  well as hypertension, as well as chronic atrial fibrillation, although  this is now controlled with Tikosyn.   SOCIAL HISTORY:   Married, lives with his spouse.  No alcohol use, no  smoking.   FAMILY HISTORY:  Heart disease.   IMAGING STUDIES:  Review of imaging studies, both looked at the report  as well as pulled up the films of his cervical CT, dated April 14, 2007.  At C2-C3, he has right grater than left facet hypertrophy; at C3-  C4, he has left greater than right facet hypertrophy with moderate left  foraminal stenosis; at C4-C5, he has left greater than right facet  hypertrophy graded as severe; at C5-C6, he has right greater than left  severe facet arthropathy as well as foraminal stenosis.  He has central  stenosis at that level with AP diameter of 6.5-mm; at C6-C7, he has  severe right equal to left bilateral facet arthropathy as well as  foraminal stenosis; C7-T1 has milder facet arthropathy, no central  stenosis.   PHYSICAL EXAMINATION:  Blood pressure 137/74, pulse 64, respirations 18,  O2 sat 97% on room air.  Tall, elderly male in no acute distress,  orientation x3.  Affect, bright and alert.  Gait is normal.  He is able  to toe walk, heel walk.  His extremities show no evidence of edema.  His  upper extremity and lower extremity coordination are  normal.  Deep  tendon reflexes are normal.  Sensation is normal in the upper and lower  extremities with exception of bilateral C6 being mildly diminished.  He  states that left side is due to a fall he had in the bathtub a year or  two ago and the right side has been like that since he had a small  stroke in the late 90s.   His upper extremity and lower extremity range of motion are normal.  His  neck range of motion is limited to 50% of normal range in forward  flexion and extension.  Extension does cause him increased pain compared  to flexion.  He has negative Spurling's maneuver bilaterally.  His  extremities show no evidence of intrinsic atrophy.  No evidence of  fasciculations.   IMPRESSION:  1. Cervical spondylosis without evidence of  myelopathy.  He does      appear to have a facet syndrome with typical pain with      hyperextension of the neck.  I explained this to the patient using      spine model and discussed several treatment options with him.  I      would like to have him a trial of traction to see if this can at      least reduce his mechanical pain symptoms.  2. We will also write for a TENS trial with physical therapy.  I will      see him back after these trials and assess whether or not it is      worth pursuing purchase on either or both of these modalities.  3. Discussed other treatment options including acupuncture, did go      through the pros and cons.  We could do this even if he is on      Coumadin this does not present problem; however, he would have to      have multiple sessions at least to establish efficacy.  4. I do think he would benefit from cervical medial branch blocks;      however, given that he is on warfarin he would have to come off of      this and this would present increased cardiac risk especially if we      had to repeat this over time.  5. I did discuss the trial of diclofenac gel only 5% systemic uptake.      Really, I do not think it presents much of a problem either from a      Gastrointestinal standpoint or from a warfarin interaction      standpoint.   Discussed this with the patient.  He is in agreement with treatment  plan.  I will see him back in follow up after he sees the physical  therapist.      Erick Colace, M.D.  Electronically Signed     AEK/MedQ  D:12/27/2007 16:48:37  T:12/28/2007 04:04:29  Job #:  [id number]

## 2010-06-29 NOTE — Op Note (Signed)
NAMELINN, GOETZE NO.:  [id number]   MEDICAL RECORD NO.:  [id number]          PATIENT TYPE:  INP   LOCATION:  2028                         FACILITY:  [facility]   PHYSICIAN:  Doylene Canning. Ladona Ridgel, MD    DATE OF BIRTH:  [birth year]   DATE OF PROCEDURE:  08/16/2007  DATE OF DISCHARGE:                               OPERATIVE REPORT   PROCEDURE PERFORMED:  Direct current cardioversion.   INDICATION:  Symptomatic atrial fibrillation, on Tikosyn.   INTRODUCTION:  The patient is a 77 year old male with longstanding  cardiomyopathy and atrial fibrillation, now admitted to the hospital for  Tikosyn therapy, but did not convert the sinus spontaneously.  He is now  referred for DC cardioversion.   PROCEDURE:  After informed consent was obtained, the patient was prepped  in the usual manner.  He was given 50 mg of sodium pentothal under the  direction of anesthesia department.  A 200 joules of synchronized  biphasic energy was subsequently delivered to the electrode dispersive  pads in the anterior-posterior position, which restored sinus rhythm.  The patient tolerated procedure well.  There were no immediate procedure  complications.      Doylene Canning. Ladona Ridgel, MD  Electronically Signed     GWT/MEDQ  D:  08/16/2007  T:  08/17/2007  Job:  [id number]   cc:   Rosalyn Gess. Norins, MD

## 2010-06-29 NOTE — Assessment & Plan Note (Addendum)
No intercurrent atrial fibrillation has been detected by his device. We will continue him on Coumadin. We'll stop his aspirin  Check K mag

## 2010-06-29 NOTE — Assessment & Plan Note (Signed)
OFFICE VISIT   SHO, SALGUERO  DOB:  [birth year]                                       12/20/2007  ZOXWR#:[id number]   The patient is a 77 year old gentleman with a history of anuric stent  graft placed in 2001.  He continues with regular graft surveillance.  Last seen in May of this year.  Returns at this time with a CT scan.  His native aneurysm remains stable at 5.5 cm x 5.3 cm.  He has a type 2  endoleak.  No significant aneurysm enlargement.  Denies abdominal pain  or back pain.   Hildred appears generally well.  Alert and oriented, no distress.  BP  130/82, pulse 60 per minute, temperature 97.9.  Abdomen soft, nontender.  No masses or organomegaly.  Femoral pulses 2+ bilaterally.   Maddex continues to do remarkably well.  No complicating features  associated with his AAA stent graft.  We will plan follow-up again in 6  months with an ultrasound.   Balinda Quails, M.D.  Electronically Signed   PGH/MEDQ  D:  12/20/2007  T:  12/21/2007  Job:  [id number]   cc:   Duke Salvia, MD, [facility]  Rosalyn Gess. Norins, MD

## 2010-06-29 NOTE — Assessment & Plan Note (Signed)
[facility]                         ELECTROPHYSIOLOGY OFFICE NOTE   [name], [name]                          MRN:          [id number]  DATE:03/24/2008                            DOB:          Oct 26, 1926    [name] comes in following a recent hospitalization for pneumonia where  he was found to be in atrial fibrillation and underwent cardioversion.  He is feeling much much better since then.  He has atrial fibrillation  for which he takes Tikosyn.  He has had no intercurrent problems with  chest pain.   His medications are reviewed and include Tikosyn 125 q.12.  He is also  on furosemide 40, benazepril 10, simvastatin 20, and warfarin.   PHYSICAL EXAMINATION:  VITAL SIGNS:  Today, his blood pressure is 120/70  with pulse of 75, his weight of 191, which is down 10 pounds from the  last 5 months.  NECK:  Veins are flat.  LUNGS:  Clear.  HEART:  Sounds are regular.  ABDOMEN:  Soft.  EXTREMITIES:  No edema.   Electrocardiogram dated today demonstrated sinus rhythm at 75 with  intervals of 0.24/0.11/0.42.  The axis was leftward, -40 with evidence  of prior septal wall myocardial infarction.   IMPRESSION:  1. Congestive heart failure - acute on chronic - stable.  2. Atrial fibrillation, status post cardioversion, holding sinus      rhythm on Tikosyn.  3. Previously implanted implantable cardioverter-defibrillator.  4. Pneumonia - improved.   We will continue [name]'s medications as they are.  We will plan to  see him again in 3 months' time.     Duke Salvia, MD, [facility]  Electronically Signed    SCK/MedQ  DD: 03/24/2008  DT: 03/25/2008  Job #: [id number]

## 2010-06-29 NOTE — Assessment & Plan Note (Signed)
[facility]                         ELECTROPHYSIOLOGY OFFICE NOTE   AUL, MANGIERI                          MRN:          [id number]  DATE:04/09/2007                            DOB:          05/07/1926    Mr. Bosher is seen following a hospitalization for which he had a high  fever, the cause of which was never clearly elucidated. He was found  however to be in atrial fibrillation at the time of that and he has  continued to suffer from some fatigue and exercise intolerance since  that period. He is status post atrial flutter more remotely and this is  his first episode of atrial fibrillation.   He has a history if ischemic heart disease with depressed left  ventricular function and is status post ICD implantation for primary  prevention.   Estimated ejection fraction is about 25%. He has a history of prior  thromboembolism and he is status post aortic aneurysm grafting.   MEDICATIONS:  1. Aspirin.  2. Simvastatin.  3. Metoprolol 23.  4. Tylenol.  5. Warfarin.  6. Diltiazem formulation at 120 mg a day.   PHYSICAL EXAMINATION:  VITAL SIGNS:  His blood pressure was 122/74 with  a pulse of 65.  LUNGS:  Clear.  HEART:  Heart sounds were irregular with an early systolic murmur.  ABDOMEN:  Soft.  EXTREMITIES:  Without edema.   Interrogation of his Guidant ICD demonstrates an R wave of 8.3 with  impedance of 549, a threshold of 0.8 at 0.4. Battery voltage is 2.91.  There are no intercurrent episodes.   IMPRESSION:  1. Atrial fibrillation - persistent.  2. Status post atrial flutter ablation.  3. Prior thromboembolism on Coumadin.  4. Coronary artery disease with;      a.     Prior bypass grafting      b.     Ejection fraction of 25%.  5. Status post Guidant implantable cardioverter-defibrillator.   Mr. Blatt is stable from a coronary disease and ventricular arrhythmia  point of view. With his atrial fibrillation and his ongoing symptom, I  would like to undertake cardioversion as soon as his INR allows. His INR  was 2.4 on March 23, 2007. His cholestyramine was decreased and  we anticipate his INR will be high at this point. We will plan to  undertake cardioversion later this  week or next week and we have reviewed this with him and his family and  he understands and is agreeable to proceeding.     Duke Salvia, MD, [facility]  Electronically Signed    SCK/MedQ  DD: 04/09/2007  DT: 04/09/2007  Job #: [id number]

## 2010-06-29 NOTE — Assessment & Plan Note (Signed)
Stable. We'll continue him on Coumadin

## 2010-06-29 NOTE — Assessment & Plan Note (Signed)
[facility]                         ELECTROPHYSIOLOGY OFFICE NOTE   TRASHAWN, OQUENDO                          MRN:          [id number]  DATE:07/24/2007                            DOB:          26-Jan-1927    Mr. Mcadam comes in today quite frustrated with his last visit that was  done in March.  He felt like he got short changed with the amount of  time, and does not recall the conversation.  I had Dr. Debby Bud over my  note, and was surprised at a couple of things. His first concern was  that he was not aware that his ejection fraction was 25%.  On review of  the chart his ejection fraction has been listed as 25%-30% for 4 years.   He does recall [place] talking about atrial fibrillation, and his persistence  in it, and his failure of cardioversion, and the importance then of  augmented rate control.  He does recall his trying to take Toprol  therapy, realizing that the tolerance of the drug was the main thing.  After poorly tolerating the up titration, he went back down.  He comes  in, now, complaining as noted about the above, as well as just  generalized fatigue, lassitude, and deteriorating capacity.   MEDICATIONS NOTABLE FOR:  1. Diltiazem started in February.  2. The metoprolol back down to 25 mg a day which is the best, that I      can tell, has been on board for about 10 years.  3. Benazepril 10 which is stable.  4. Simvastatin.  5. Coumadin.  6. Aspirin.   PHYSICAL EXAMINATION:  His blood pressure today was 120/74 with a pulse  was 62.  His weight was 201 which is stable.  He was in no acute distress.  HEENT:  Exam was without icterus.  His neck veins were flat.  His lungs were clear.  HEART:  Sounds were irregular.  ABDOMEN:  Soft.  EXTREMITIES:  Had  2+ edema on the left and 1+ edema on the right.   INTERROGATION:  Of his Guidant Vitality ICD demonstrates an R wave of  6.9 with impedance of 530, threshold of 0.8 at 0.4.  Battery voltage  was  2.71.   IMPRESSION:  1. Fatigue and lassitude.  2. Atrial fibrillation, persistent.  3. Ischemic heart disease with      a.     Prior bypass surgery.      b.     Depressed left ventricular function, chronically in the 25%-       30% range without recent reassessment.  4. Sinus bradycardia.  5. History of prior transient ischemic attack.  6. Previously implanted single chamber implantable cardioverter-      defibrillator for primary prevention.   Mr. Kon and his wife and I had a lengthy discussion regarding overall  symptoms.  I suggested that his progressive deterioration might derive  from (a) his medications, (b) the persistence of atrial fibrillation and  (c) the deteriorating left ventricular systolic function after many many  years.   We decided to  approach it sequentially.  We will first discontinue his  diltiazem, and see how he does over the next 2 weeks.  In the event that  he is feeling back to normal.  We can address the question of rate  control versus rhythm control.  In the event that he is not feeling  well, we will plan to admit him to the hospital for initiation of  Tikosyn therapy; realizing that amiodarone may complicate his resting  bradycardia required device revision.   At this point, we will leave his Toprol on board.     Duke Salvia, MD, [facility]  Electronically Signed    SCK/MedQ  DD: 07/24/2007  DT: 07/24/2007  Job #: [id number]

## 2010-06-29 NOTE — Procedures (Signed)
ENDOVASCULAR STENT GRAFT EXAM   INDICATION:  Abdominal aortic aneurysm stent repair.   HISTORY:  Abdominal aortic aneurysm repair in 2001.                           DUPLEX EVALUATION   AAA Sac Size:                 5.4 CM AP             5.5 CM TRV  Previous Sac Size:            (CT) 5.5 CM AP        (CT) 5.3 CM TRV  Evidence of an endoleak?      No                    No   Velocity Criteria:  Proximal Aorta                40 cm/sec  Proximal Stent Graft          56 cm/sec  Main Body Stent Graft-Mid     67 cm/sec  Right Limb-Proximal           64 cm/sec  Right Limb-Distal             64 cm/sec  Left Limb-Proximal            62 cm/sec  Left Limb-Distal              47 cm/sec  Patent Renal Arteries?        Yes                   Yes   IMPRESSION:  1. Patent Endostent of the abdominal aortic aneurysm with no flow      noted within the aneurysm sac; however, the CT study from      12/20/2007 showed a type II endoleak.  2. No significant change in the abdominal aortic aneurysm sac size      noted when compared to the previous CT on 12/20/2007.  3. Unable to obtain bilateral ankle brachial indices due to calcific      vessels, however bilateral biphasic tibial waveforms and normal      bilateral toe brachial indices were noted.     ___________________________________________  P. Liliane Bade, M.D.   CH/MEDQ  D:  06/19/2008  T:  06/19/2008  Job:  [id number]

## 2010-06-29 NOTE — Assessment & Plan Note (Signed)
OFFICE VISIT   [name], [name]  DOB:  1926/03/15                                       06/21/2007  QQVZD#:[id number]   The patient is a 77 year old gentleman who underwent an initial  placement of an Anurx stent graft for AAA in 2001.  He is followed up  regularly for stent graft surveillance.   He underwent a duplex ultrasound of his stent today, this reveals his  native aneurysm to be stable in size at 5.2 cm.  No evidence of  enlargement.  No evidence of endoleak.  The graft is patent and renal  arteries patent bilaterally.  No complicating factors evident.   Since last seen, the patient was admitted to the hospital in January of  this year with a fever of unknown origin.  He is now back in atrial  fibrillation.   Medications include aspirin 81 mg daily, Diltiazem XR 120 mg daily,  simvastatin 20 mg daily, Toprol 25 mg daily, benazepril 10 mg daily,  Coumadin 5 to 7.5 mg daily, multivitamin 1 tablet daily, and Tylenol 2  to 4 tablets daily.  Allergies are Phenergan.   On evaluation, the patient is a 77 year old gentleman, appears his  stated age.  Alert and oriented.  No acute distress.  BP 141/81, pulse  94 per minute, respirations 16 per minute.   Abdomen is soft and nontender.  No masses or organomegaly.  2+ femoral  pulses bilaterally.   Overall the patient is doing well from point of view of his aortic stent  graft.  No complicating factors evident.  Will plan followup again in 6  months with a CT scan.   Balinda Quails, M.D.  Electronically Signed   PGH/MEDQ  D:  06/21/2007  T:  06/22/2007  Job:  [id number]   cc:   Duke Salvia, MD, [facility]  Rosalyn Gess. Norins, MD

## 2010-06-29 NOTE — Patient Instructions (Addendum)
Your physician has recommended you make the following change in your medication:  1) Stop Aspirin. 2) Decrease furosemide to 40mg  one tablet on mondays, wednesdays, and fridays. 3) Decrease potassium to one tablet on mondays, wednesdays, and fridays.  Your physician wants you to follow-up in: 1 year. You will receive a reminder letter in the mail two months in advance. If you don't receive a letter, please call our office to schedule the follow-up appointment.  Your physician recommends that you have lab work today: bmet/magnesium

## 2010-06-29 NOTE — Procedures (Signed)
ENDOVASCULAR STENT GRAFT EXAM   INDICATION:  Followup, abdominal aortic aneurysm repair.   HISTORY:  Status post aortobiiliac stent graft repair of abdominal aortic aneurysm  in 2001.                           DUPLEX EVALUATION   AAA Sac Size:                 4.9 CM AP             5.2 CM TRV  Previous Sac Size:            04/27/2006 (CT, [facility]) 5.2 CM  AP                                             5.2 CM TRV  Evidence of an endoleak?      No                    No   Velocity Criteria:  Proximal Aorta                79 cm/sec  Proximal Stent Graft          63 cm/sec  Main Body Stent Graft-Mid     88 cm/sec  Right Limb-Proximal           73 cm/sec  Right Limb-Distal             83 cm/sec  Left Limb-Proximal            84 cm/sec  Left Limb-Distal              51 cm/sec  Patent Renal Arteries?        Yes                   Yes   IMPRESSION:  1. Patent aortobiiliac stent graft with no evidence of endoleak.  2. Stable residual abdominal aortic aneurysm sac size.  3. Inferior mesenteric artery appears patent via other mesenteric      branches; however, does not appear to fill abdominal aortic      aneurysm sac.   ___________________________________________  P. Liliane Bade, M.D.   AS/MEDQ  D:  12/21/2006  T:  12/22/2006  Job:  [id number]

## 2010-06-29 NOTE — Assessment & Plan Note (Signed)
Mr. [name] returns today.  He is a 77 year old male with chronic neck pain  and a CT of his neck demonstrated significant cervical spondylosis.  His  pain is worse with looking up or when he turned his head to either side.  He has no arm symptoms.  He has no weakness.  He has no bowel or bladder  dysfunction.  We did trial diclofenac cream and even though he is only  using it twice a day, he has noticed some improvement.  He thinks his  protime has bumped up some, in fact he states that he has been  consistently around 2.5 and more recently 2.8 and then as high as 3.0  resulting in reduction of the Coumadin dose.  I did indicate this would  be quite unusual given that only 5% of the dosage is absorbed  systemically.  He is on acetaminophen and sometimes takes higher dosages  which may also raise protime.   He has had no other medical problems in the interval time.  I had made a  referral to PT for TENS and traction.  I have documentation of faxing  the referral; however, they did not call him for his report.  I will  send a copy of this report over to Claris Gower at [facility] on [address].   The patient's current pain is 1 but averages to around 3.  Sleep is  good.  He can walk 20-30 minutes at a time, he climbs steps, he drives.   REVIEW OF SYSTEMS:  Positive of easy bleeding due to Coumadin as well as  shortness of breath with activity.   PAST HISTORY:  Heart disease.  He is on Coumadin.  He has AFib with  history of CVA.   He does continue to work.  He is married.   EXAMINATION:  Blood pressure 130/96, recheck at 143/73, pulse 70,  respirations 80, O2 sat 97% on room air.   Deep tendon reflexes are normal.  Coordination is normal in the upper  and lower extremities.  Extremities without edema.  He has good strength  in the upper extremities.  He has no negative Spurling test.  He does  have pain over the lateral neck area particularly with  hyperextension of  the neck as well as lateral bending.   IMPRESSION:  Cervical spondylosis without evidence of radiculopathy or  myelopathy.   Given that his pain is only moderate and mainly directional, we will  proceed with more conservative treatments such as traction trial as well  as transcutaneous electrical nerve stimulator trial.  Home unit can be  obtained through therapy should he benefit.  If this does not help and  his pain worsens, other options would include cervical medial branch  block; however, we need to drop the Coumadin and relative risk and  benefit ratios should be considered.   We will continue his Voltaren gel, however, increase this 3 times per  day.  I would not anticipate that this should cause a measurable bump in  his protime.      Erick Colace, M.D.  Electronically Signed     AEK/MedQ  D:  01/25/2008 15:57:18  T:  01/26/2008 04:30:49  Job #:  [id number]   cc:   Rosalyn Gess. Norins, MD  [address]  [place]  [place] [address]

## 2010-06-29 NOTE — Op Note (Signed)
NAMEGREYSIN, MEDLEN NO.:  [id number]   MEDICAL RECORD NO.:  [id number]          PATIENT TYPE:  OIB   LOCATION:  2855                         FACILITY:  [facility]   PHYSICIAN:  Duke Salvia, MD, FACCDATE OF BIRTH:  [birth year]   DATE OF PROCEDURE:  04/12/2007  DATE OF DISCHARGE:                               OPERATIVE REPORT   PREOPERATIVE DIAGNOSIS:  Atrial fibrillation.   POSTOPERATIVE DIAGNOSIS:  Sinus rhythm.   PROCEDURE:  DC cardioversion.   DESCRIPTION OF PROCEDURE:  The patient was submitted to general  anesthesia under the care of Dr. Wyn Forster receiving 275 mg of sodium  Pentothal.  A 31 joule shock was delivered synchronously via his T135  Guidant ICD through a measured resistant of 35 ohms terminating atrial  fibrillation and restoring sinus rhythm.  The patient tolerated the  procedure well without apparent complications.      Duke Salvia, MD, [facility]  Electronically Signed     SCK/MEDQ  D:  04/12/2007  T:  04/12/2007  Job:  [id number]   cc:   Electrophysiology Laboratory

## 2010-06-29 NOTE — H&P (Signed)
NAMEBRYOR, [name] NO.:  [id number]   MEDICAL RECORD NO.:  [id number]          PATIENT TYPE:  INP   LOCATION:  2911                         FACILITY:  [facility]   PHYSICIAN:  [name] Doyne, [name]    DATE OF BIRTH:  Jun 30, 1926   DATE OF ADMISSION:  03/04/2008  DATE OF DISCHARGE:                              HISTORY & PHYSICAL   PRIMARY CARE [name]:  Dr. Debby [name].   PRIMARY CARDIOLOGIST:  Dr. Graciela [name].   CHIEF COMPLAINT:  Dyspnea on exertion and shortness of breath.   HISTORY OF PRESENT ILLNESS:  A 77 year old white male with past medical  history significant for coronary artery disease status post coronary  artery bypass grafting in the remote past, ischemic cardiomyopathy with  an ejection fraction of 15% status post AICD who presents with shortness  of breath.  The patient reports progressive 46-month history of shortness  of breath and dyspnea on exertion.  This became acutely worse over the  past 2-3 days.  Over the past 2-3 days, he reports severe dyspnea on  exertion when walking only 2-3 steps.  He denies any paroxysmal  nocturnal dyspnea but notes stable 2-pillow orthopnea.  His weight is  increased 5 pounds.  He denies any lower extremity edema or chest pain.  He saw his primary care [name] on January 18, and a chest x-ray was  performed.  This was worrisome for left-sided pneumonia with left-sided  pleural effusion.  Because of this, he was started on Avelox therapy.  His symptoms became worse, and he came to the emergency department for  evaluation.   In the emergency department, he was placed on nitroglycerin paste as  well as Lasix 40 mg IV.   PAST MEDICAL HISTORY:  1. Paroxysmal atrial fibrillation status post prior ablation and      cardioversions, currently on Tikosyn therapy and anticoagulation.      He has maintained in normal sinus rhythm.  2. Ischemic cardiomyopathy with ejection fraction of 15%, status post      AICD implantation in 2005  for primary prevention.  3. Coronary artery disease status post CABG in 1991, status post PCI      x1.  4. Abdominal aortic aneurysm status post endovascular repair with      stent graft in 2001.  5. Peripheral vascular disease with a history of TIAs and cerebral      vascular accident.  6. Hyperlipidemia.   SOCIAL HISTORY:  The patient lives in [place] with his wife.  He  denies any tobacco, alcohol or drugs.   FAMILY HISTORY:  Shows no family history of early coronary disease.   ALLERGIES:  PHENERGAN.   MEDICATIONS:  1. Tikosyn 125 mg b.i.d. at 8:00 a.m. and 8:00 p.m.  2. Aspirin 81 mg daily.  3. Zocor 20 mg daily.  4. Coumadin 5 mg daily.  5. Benazepril 10 mg daily.  6. Fish oil daily.  7. Avelox 400 mg daily.  8. Metoprolol tartrate 25 mg daily.   PHYSICAL EXAMINATION:  VITAL SIGNS:  Temperature afebrile.  Blood  pressure is  180/110, pulse 102, respirations 32, oxygen saturation 93%  on 2 liters nasal cannula.  GENERAL:  The patient is in mild distress.  HEENT:  Normocephalic, atraumatic.  Pupils are equal, round, and  reactive to light and accommodation.  Extraocular movements are intact.  Oropharynx pink and moist without lesions.  NECK:  Is supple.  It reveals evidence of venous distention to the level  of the mandible.  CARDIOVASCULAR:  Tachycardic.  Normal S1-S2.  There is evidence of S3;  1/6 systolic murmur heard at the left upper sternal border.  LUNGS:  Coarse breath sounds bilaterally, bibasilar crackles.  ABDOMEN:  Positive bowel sounds, soft, nontender, nondistended.  EXTREMITIES:  No clubbing, cyanosis or edema.  Dorsalis pedis pulses 2+  bilaterally.  Extremities are warm, 2+ radial pulses.  SKIN:  No rashes.  BACK:  No CVA tenderness.   DATA:  1. Chest x-ray shows a left lower lobe opacity with perihilar airspace      disease concerning for pulmonary edema.  2. EKG shows sinus rhythm at 91 beats per minute with incomplete left      bundle branch  block.  No acute ST-T wave changes.  3. Laboratory data shows a BNP of 837, hemoglobin 14.6, creatinine 1,      BUN 18, troponin less than 0.052, INR 2.4.   ASSESSMENT AND PLAN:  A 77 year old white male with past medical history  significant for coronary disease status post coronary artery bypass  grafting, ischemic cardiomyopathy with ejection fraction of 15% and  status post AICD implantation who presents with decompensated congestive  heart failure exacerbation and possible evidence of pneumonia.   1. We will admit the patient to [name] under Dr.      [place] [name] care.  We will be monitoring the patient in the coronary      care unit.   1. Acute decompensated heart failure.  It is unclear what the      precipitant was, but certainly, this may be related to left lower      lobe pneumonia or progressive worsening of his heart failure.  He      does appear to be in some mild respiratory distress at this time.      He appears to be in a well perfused, volume overloaded state.  His      current blood pressure is 180/110.  He has been given Lasix 40 mg      x1, and we will continue this at 40 mg IV b.i.d.  We will monitor      his ins and outs daily as well as his daily weights.  We will start      him on a nitroglycerin drip for acute management of his blood      pressure as well as for venodilation to hopefully alleviate some of      his pulmonary edema.  He does have evidence of mild aortic      stenosis.  However, his mean gradient was only 13, and his valve      area measured at 1.4 cm2.  Because of this, I think it is safe to      use nitroglycerin.  We will check an ABG to document what his      current oxygenation and ventilatory status is.  Should this be      abnormal, then he may need to go on BiPAP and/or consider      intubation if he clinically decompensates.  We will check an      echocardiogram in the morning to reassess his left ventricular       function.  We will monitor the patient on telemetry and rule him      out with cardiac enzymes.  As he may have had evidence of      pneumonia, we will treat him with Augmentin and will not be using      Avelox as he is on Tikosyn, and this can be proarrhythmic,      prolonging his QTC.  We will increased benazepril to 20 mg daily      for management of his blood pressure.  Will be holding his beta      blocker at this time because he is in decompensated heart failure.   1. Paroxysmal atrial fibrillation.  The patient is currently in normal      sinus rhythm.  We will continue on his rhythm control medication of      Tikosyn.  Continue Coumadin.  We will be holding beta-blocker for      the reasons noted above.   1. Hyperlipidemia.  Check fasting lipid profile.  Continue Zocor and      fish oil.   1. Coronary disease status post coronary bypass grafting.  We will      continue him on his aspirin, Zocor and benazepril.  We will be      holding his beta blocker because of his heart failure and rule him      out for myocardial infarction with serial cardiac enzymes.  Monitor      the patient on telemetry.   1. Fluids, electrolytes, and nutrition.  Saline lock IV fluids.  Will      fluid restrict the patient to 1.5 liters of fluid per day.  His      electrolytes are stable.  We will monitor him with daily basic      metabolic profiles, low-salt diet.   1. Deep vein thrombosis prophylaxis is not indicated as the patient is      systemically anticoagulated with Coumadin.      [name] Doyne, [name]  Electronically [name]     SJT/MEDQ  D:  03/05/2008  T:  03/05/2008  Job:  [id number]

## 2010-06-29 NOTE — Assessment & Plan Note (Signed)
A 77 year old male with chronic neck pain.  CT of the neck shows a  significant cervical spondylosis.  In the interval time, he has been  hospitalized for CHF, but overall feels better now that he is on a low-  sodium diet and medication changes.  He has a new issue of vertigo.  This happened really just a couple times where he felt like the room was  spinning.  It was when he was getting from sitting to standing.  Denies  any lightheaded feeling, no falls.  He was wondering if any of his  medications can do this.  I did review his medications, did not see any  obvious causes.  It did not appear to be a presyncopal type of event.  I  told him the only medicine I was not ensure about in terms of side  effects was Tikosyn since I do not have a lot of patients on that.  Looking it up in the properties does demonstrate that dizziness can be a  side effect.   He has received a traction device, which is a floor pneumatic; however,  states that due to his kyphosis, he can stay seated and that he does  actually better with over the door and I will write an order for that  today.   CURRENT MEDICATIONS:  He is on Coumadin for AFib, I have him on some  Voltaren gel that he uses b.i.d. for his neck, which he really thinks  help quite a bit.   He has finished physical therapy, done a home exercise program, but not  religiously as of recent.   His Oswestry score is only 2%.   He continues to work part-time.   PHYSICAL EXAMINATION:  GENERAL:  No acute stress, ambulation without toe  drag or knee instability.  NECK:  Range of motion 50% forward, flexion/extension, lateral rotation,  bending.  No tenderness to palpation of the cervical spine.  Upper  extremity deep tendon reflexes, upper extremity range of motion, and  upper extremity strength is all normal.   IMPRESSION:  Cervical spondylosis without evidence of radiculopathy or  myelopathy.   PLAN:  We will continue current meds.  I will see  him back in 6 months.  I have written over the door traction.  Told him to discuss his  dizziness with his cardiologist in terms of seeing whether his cardiac  meds can cause that.  If negative, consider outpatient PT eval for  benign positional vertigo prior to launching into a more exhaustive  workup including MRI of the head.      Erick Colace, M.D.  Electronically Signed     AEK/MedQ  D:  05/02/2008 15:02:33  T:  05/03/2008 03:45:32  Job #:  [id number]

## 2010-07-02 NOTE — Cardiovascular Report (Signed)
[name], [name] NO.:  [id number]   MEDICAL RECORD NO.:  [id number]                   PATIENT TYPE:  OIB   LOCATION:  6501                                 FACILITY:  [facility]   PHYSICIAN:  Charlton Haws, M.D.                  DATE OF BIRTH:  [birth year]   DATE OF PROCEDURE:  DATE OF DISCHARGE:                              CARDIAC CATHETERIZATION   INDICATIONS:  Ischemic cardiomyopathy, coronary artery bypass grafting in  1991, question need for AICD.   Cine catheterization was done from the right femoral artery.  The patient  has had an AneuRX graft placed by Dr. Madilyn Fireman, and we did not have any  difficulty accessing the right limb of this graft from the right femoral  artery.  Four French catheters were used.   1. The left main coronary artery had a 40% discrete stenosis.  2. The left anterior descending artery was 100% occluded proximally.  3. The circumflex coronary artery was 100% occluded in the midvessel.  4. The right coronary artery had 30% multiple discrete lesions in the mid-     and distal portion.  The PDA had 30-40% multiple discrete lesions.  The     distal PDA fed the LAD with good collaterals all the way up to the     midvessel.  5. The saphenous vein graft to the obtuse marginal branch is widely patent.     The distal anastomosis site had some graft-vessel mismatch and possibly a     50% ostial stenosis, but flow was normal.  6. The saphenous vein graft to the diagonal branch was normal with good     retrograde flow down the LAD as well.  7. The saphenous vein graft to the RCA was also normal.  8. The left internal mammary artery was atretic as previously described.     There was no significant subclavian disease.   RAO VENTRICULOGRAPHY:  The RAO ventriculography showed global hypokinesis,  worse in the anterior wall and apex.  The ejection fraction is in the 25%  range.   There was +1 mitral insufficiency.  There was no gradient  across the aortic  valve, aortic pressure was in the 130/67 range.  LV pressure was 130/18.   Distal aortography was performed with bilateral femoral runoff.  There was  no endograft leak.  The stent seemed well-apposed with brisk flow down both  iliacs.   IMPRESSION:  The patient's grafts are patent.  There is no area of poor  flow.  The left anterior descending coronary artery actually is getting flow  retrograde from the diagonal graft as well as from the posterior descending  artery.  I think this is a stable situation.  I think the patient should  have continued medical therapy.  He will follow up with our  electrophysiologist with regard to probable placement of an automatic  implantable cardioverter-defibrillator.                                               Charlton Haws, M.D.    PN/MEDQ  D:  08/19/2003  T:  08/19/2003  Job:  [id number]   cc:   Cecil Cranker, M.D.   Rosalyn Gess Norins, M.D. [facility]   Rollene Rotunda, M.D.   Doylene Canning. Ladona Ridgel, M.D.

## 2010-07-13 ENCOUNTER — Other Ambulatory Visit: Payer: Self-pay | Admitting: Internal Medicine

## 2010-07-20 ENCOUNTER — Other Ambulatory Visit: Payer: Self-pay | Admitting: Internal Medicine

## 2010-07-27 ENCOUNTER — Ambulatory Visit (INDEPENDENT_AMBULATORY_CARE_PROVIDER_SITE_OTHER): Payer: Medicare Other | Admitting: *Deleted

## 2010-07-27 ENCOUNTER — Telehealth: Payer: Self-pay | Admitting: *Deleted

## 2010-07-27 DIAGNOSIS — I4891 Unspecified atrial fibrillation: Secondary | ICD-10-CM

## 2010-07-27 DIAGNOSIS — Z8679 Personal history of other diseases of the circulatory system: Secondary | ICD-10-CM

## 2010-07-27 NOTE — Telephone Encounter (Signed)
The patient was here for a coumadin check today. He wanted to know if he should be ok for a root canal tomorrow. I explained from our perspective that he should be fine to have this done. He states the dental office has not said anything about him holding his coumadin.

## 2010-08-24 ENCOUNTER — Ambulatory Visit (INDEPENDENT_AMBULATORY_CARE_PROVIDER_SITE_OTHER): Payer: Medicare Other | Admitting: *Deleted

## 2010-08-24 ENCOUNTER — Other Ambulatory Visit: Payer: Self-pay | Admitting: Internal Medicine

## 2010-08-24 DIAGNOSIS — Z8679 Personal history of other diseases of the circulatory system: Secondary | ICD-10-CM

## 2010-08-24 DIAGNOSIS — I4891 Unspecified atrial fibrillation: Secondary | ICD-10-CM

## 2010-09-20 ENCOUNTER — Other Ambulatory Visit: Payer: Self-pay | Admitting: Internal Medicine

## 2010-09-21 ENCOUNTER — Ambulatory Visit (INDEPENDENT_AMBULATORY_CARE_PROVIDER_SITE_OTHER): Payer: Medicare Other | Admitting: *Deleted

## 2010-09-21 DIAGNOSIS — I4891 Unspecified atrial fibrillation: Secondary | ICD-10-CM

## 2010-09-21 DIAGNOSIS — Z8679 Personal history of other diseases of the circulatory system: Secondary | ICD-10-CM

## 2010-09-30 ENCOUNTER — Ambulatory Visit (INDEPENDENT_AMBULATORY_CARE_PROVIDER_SITE_OTHER): Payer: Medicare Other | Admitting: *Deleted

## 2010-09-30 ENCOUNTER — Other Ambulatory Visit: Payer: Self-pay | Admitting: Internal Medicine

## 2010-09-30 DIAGNOSIS — I4891 Unspecified atrial fibrillation: Secondary | ICD-10-CM

## 2010-09-30 DIAGNOSIS — Z95810 Presence of automatic (implantable) cardiac defibrillator: Secondary | ICD-10-CM

## 2010-09-30 DIAGNOSIS — I428 Other cardiomyopathies: Secondary | ICD-10-CM

## 2010-09-30 LAB — REMOTE ICD DEVICE
DEV-0020ICD: NEGATIVE
DEVICE MODEL ICD: 940856
FVT: 0
PACEART VT: 0
RV LEAD AMPLITUDE: 4.8 mv
TOT-0006: 20120510000000
TZAT-0001FASTVT: 1
TZAT-0001SLOWVT: 1
TZAT-0013FASTVT: 2
TZAT-0013SLOWVT: 2
TZAT-0018FASTVT: NEGATIVE
TZAT-0018SLOWVT: NEGATIVE
TZON-0003FASTVT: 300 ms
TZST-0001FASTVT: 3
TZST-0001FASTVT: 5
TZST-0001FASTVT: 6
TZST-0001SLOWVT: 3
TZST-0001SLOWVT: 4
TZST-0001SLOWVT: 5
TZST-0001SLOWVT: 7
TZST-0003FASTVT: 14 J
TZST-0003SLOWVT: 14 J
TZST-0003SLOWVT: 31 J
TZST-0003SLOWVT: 31 J

## 2010-10-04 ENCOUNTER — Other Ambulatory Visit: Payer: Self-pay | Admitting: Internal Medicine

## 2010-10-08 NOTE — Progress Notes (Signed)
icd remote check  

## 2010-10-13 ENCOUNTER — Encounter: Payer: Self-pay | Admitting: *Deleted

## 2010-10-26 ENCOUNTER — Ambulatory Visit (INDEPENDENT_AMBULATORY_CARE_PROVIDER_SITE_OTHER): Payer: Medicare Other | Admitting: *Deleted

## 2010-10-26 DIAGNOSIS — I4891 Unspecified atrial fibrillation: Secondary | ICD-10-CM

## 2010-10-26 DIAGNOSIS — Z8679 Personal history of other diseases of the circulatory system: Secondary | ICD-10-CM

## 2010-11-05 LAB — BASIC METABOLIC PANEL
BUN: 10
BUN: 9
CO2: 27
Calcium: 8.4
Calcium: 8.4
Chloride: 102
Creatinine, Ser: 1.17
Creatinine, Ser: 1.21
GFR calc Af Amer: >60
GFR calc non Af Amer: 58 — ABNORMAL LOW
GFR calc non Af Amer: >60
GFR calc non Af Amer: >60
Glucose, Bld: 127 — ABNORMAL HIGH
Potassium: 4.3
Sodium: 129 — ABNORMAL LOW
Sodium: 136
Sodium: 137

## 2010-11-05 LAB — T4, FREE: Free T4: 1.42

## 2010-11-05 LAB — URINALYSIS, ROUTINE W REFLEX MICROSCOPIC
Bilirubin Urine: NEGATIVE
Glucose, UA: NEGATIVE
Ketones, ur: NEGATIVE
Protein, ur: 30 — AB

## 2010-11-05 LAB — PROTIME-INR
INR: 1.3
Prothrombin Time: 16.7 — ABNORMAL HIGH
Prothrombin Time: 17.3 — ABNORMAL HIGH

## 2010-11-05 LAB — DIFFERENTIAL
Basophils Absolute: 0
Basophils Absolute: 0
Basophils Relative: 0
Basophils Relative: 0
Eosinophils Absolute: 0.1
Lymphocytes Relative: 5 — ABNORMAL LOW
Monocytes Relative: 9
Neutro Abs: 2.8
Neutro Abs: 7.5
Neutrophils Relative %: 65
Neutrophils Relative %: 89 — ABNORMAL HIGH

## 2010-11-05 LAB — CULTURE, BLOOD (ROUTINE X 2)
Culture: NO GROWTH
Culture: NO GROWTH

## 2010-11-05 LAB — CBC
HCT: 33 — ABNORMAL LOW
Hemoglobin: 11.7 — ABNORMAL LOW
MCHC: 34.7
MCHC: 34.9
MCV: 98.2
MCV: 98.7
Platelets: 108 — ABNORMAL LOW
RBC: 3.51 — ABNORMAL LOW
RDW: 13.1
RDW: 13.4
WBC: 4.5

## 2010-11-05 LAB — URINE CULTURE: Colony Count: 30000

## 2010-11-05 LAB — CARDIAC PANEL(CRET KIN+CKTOT+MB+TROPI)
CK, MB: 1.5
Total CK: 47
Troponin I: 0.08 — ABNORMAL HIGH

## 2010-11-05 LAB — HEMOGLOBIN A1C
Hgb A1c MFr Bld: 5.5
Mean Plasma Glucose: 119

## 2010-11-05 LAB — INFLUENZA A+B VIRUS AG-DIRECT(RAPID)

## 2010-11-05 LAB — OCCULT BLOOD X 1 CARD TO LAB, STOOL: Fecal Occult Bld: NEGATIVE

## 2010-11-05 LAB — URINE MICROSCOPIC-ADD ON

## 2010-11-05 LAB — HEPATIC FUNCTION PANEL
AST: 42 — ABNORMAL HIGH
Albumin: 2.6 — ABNORMAL LOW
Alkaline Phosphatase: 55
Total Bilirubin: 0.9
Total Protein: 6

## 2010-11-05 LAB — TROPONIN I: Troponin I: 0.1 — ABNORMAL HIGH

## 2010-11-05 LAB — TSH: TSH: 0.156 — ABNORMAL LOW

## 2010-11-11 LAB — BASIC METABOLIC PANEL
BUN: 13
BUN: 16
CO2: 26
CO2: 28
Calcium: 9.1
Calcium: 9.1
Chloride: 102
Chloride: 106
Chloride: 106
Creatinine, Ser: 1.25
Creatinine, Ser: 1.34
GFR calc Af Amer: >60
Glucose, Bld: 95
Potassium: 4

## 2010-11-11 LAB — PROTIME-INR
INR: 2.5 — ABNORMAL HIGH
INR: 2.3 — ABNORMAL HIGH
Prothrombin Time: 23.8 — ABNORMAL HIGH
Prothrombin Time: 25.8 — ABNORMAL HIGH

## 2010-11-11 LAB — CBC
HCT: 39.9
MCHC: 34.1
MCV: 98.1
Platelets: 98 — ABNORMAL LOW
RDW: 14.4
WBC: 6.3

## 2010-11-23 ENCOUNTER — Ambulatory Visit (INDEPENDENT_AMBULATORY_CARE_PROVIDER_SITE_OTHER): Payer: Medicare Other | Admitting: *Deleted

## 2010-11-23 DIAGNOSIS — I4891 Unspecified atrial fibrillation: Secondary | ICD-10-CM

## 2010-11-23 DIAGNOSIS — Z8679 Personal history of other diseases of the circulatory system: Secondary | ICD-10-CM

## 2010-11-23 LAB — POCT INR: INR: 2.2

## 2010-12-21 ENCOUNTER — Ambulatory Visit (INDEPENDENT_AMBULATORY_CARE_PROVIDER_SITE_OTHER): Payer: Medicare Other | Admitting: *Deleted

## 2010-12-21 ENCOUNTER — Encounter: Payer: Medicare Other | Admitting: *Deleted

## 2010-12-21 DIAGNOSIS — Z8679 Personal history of other diseases of the circulatory system: Secondary | ICD-10-CM

## 2010-12-21 DIAGNOSIS — Z7901 Long term (current) use of anticoagulants: Secondary | ICD-10-CM

## 2010-12-21 DIAGNOSIS — I4891 Unspecified atrial fibrillation: Secondary | ICD-10-CM

## 2010-12-30 ENCOUNTER — Other Ambulatory Visit: Payer: Self-pay | Admitting: Internal Medicine

## 2010-12-30 ENCOUNTER — Encounter: Payer: Self-pay | Admitting: Internal Medicine

## 2010-12-30 ENCOUNTER — Ambulatory Visit (INDEPENDENT_AMBULATORY_CARE_PROVIDER_SITE_OTHER): Payer: Medicare Other | Admitting: *Deleted

## 2010-12-30 DIAGNOSIS — I4891 Unspecified atrial fibrillation: Secondary | ICD-10-CM

## 2010-12-30 DIAGNOSIS — Z95810 Presence of automatic (implantable) cardiac defibrillator: Secondary | ICD-10-CM

## 2010-12-31 LAB — REMOTE ICD DEVICE
BATTERY VOLTAGE: 2.48 V
BRDY-0002RV: 40 {beats}/min
HV IMPEDENCE: 37 Ohm
PACEART VT: 0
RV LEAD IMPEDENCE ICD: 478 Ohm
TZAT-0001FASTVT: 2
TZAT-0001SLOWVT: 1
TZAT-0013FASTVT: 2
TZAT-0013SLOWVT: 2
TZAT-0018FASTVT: NEGATIVE
TZAT-0018SLOWVT: NEGATIVE
TZAT-0018SLOWVT: NEGATIVE
TZST-0001FASTVT: 4
TZST-0001FASTVT: 5
TZST-0001SLOWVT: 3
TZST-0001SLOWVT: 4
TZST-0001SLOWVT: 5
TZST-0003FASTVT: 14 J
TZST-0003FASTVT: 31 J
TZST-0003SLOWVT: 31 J
TZST-0003SLOWVT: 31 J
TZST-0003SLOWVT: 31 J

## 2011-01-04 ENCOUNTER — Other Ambulatory Visit: Payer: Self-pay | Admitting: Internal Medicine

## 2011-01-04 ENCOUNTER — Encounter: Payer: Self-pay | Admitting: *Deleted

## 2011-01-04 ENCOUNTER — Encounter: Payer: Self-pay | Admitting: Internal Medicine

## 2011-01-04 ENCOUNTER — Ambulatory Visit (INDEPENDENT_AMBULATORY_CARE_PROVIDER_SITE_OTHER): Payer: Medicare Other | Admitting: Internal Medicine

## 2011-01-04 DIAGNOSIS — Z95810 Presence of automatic (implantable) cardiac defibrillator: Secondary | ICD-10-CM

## 2011-01-04 DIAGNOSIS — I4891 Unspecified atrial fibrillation: Secondary | ICD-10-CM

## 2011-01-04 DIAGNOSIS — I5022 Chronic systolic (congestive) heart failure: Secondary | ICD-10-CM

## 2011-01-04 DIAGNOSIS — I2510 Atherosclerotic heart disease of native coronary artery without angina pectoris: Secondary | ICD-10-CM

## 2011-01-04 NOTE — Assessment & Plan Note (Signed)
Stable symptoms. Given his IVCD pattern, he is not a candidate for upgrade.

## 2011-01-04 NOTE — Assessment & Plan Note (Signed)
The device has reached ERI. We have discussed the merits of changing out of defibrillator in an 77 year old gentleman and a lack of data supporting. However, he remains very vigorous and would like to have it replaced. I think that is reasonable. There appears to be one intercurrent therapy. We have reviewed the risks including but not limited to infection and lead fracture. He understands these risks and is willing to proceed

## 2011-01-04 NOTE — Progress Notes (Signed)
HPI  [name] is a 77 y.o. male Seen in followup for ischemic cardiomyopathy And status post CABG in 1991. Catheterization 2005 demonstrated patent vein grafts. Echo cardiogram 2010 demonstrated stable left ventricular function at 25%.  The patient denies chest pain, shortness of breath, nocturnal dyspnea, orthopnea or peripheral edema.  There have been no palpitations, lightheadedness or syncope.   He is not a recurrent problems with shortness of breath since having changed his diet to a low sodium diet. 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 Atrial fibrillation     AFIB  . Ischemic cardiomyopathy     CABG in 1991; last catheter 2005 with patent vein graft to PD, diagonal, marginal/echo 2010 EF 25%.  . Automatic implantable cardiac defibrillator     [employer]  . Abdominal aneurysm without mention of rupture   . Chronic systolic heart failure     SYSTOLIC .Marland KitchenACUTE ON CHRONIC  . Hypertension   . Peripheral vascular disease, unspecified   . Hemoptysis   . Acute prostatitis   . Labyrinthitis, unspecified   . Unspecified fall   . Unspecified hearing loss   . Cervicalgia   . Personal history of other diseases of digestive system   . Stroke     Past Surgical History  Procedure Date  . Insert / replace / remove pacemaker 2006    AICD GUIDANT VITALITY..IN SITU  . Colectomy 07/15/96  . Colostomy 07/15/96    COLOSTOMY TAKEN DOWN 01/14/97  . Coronary artery bypass graft 03/22/89  . Cardiac catheterization 2001    ANGIOPLASTY W/STENT RCA  . Abdominal aortic aneurysm repair 05/1999    STENT, ENDOVASCULAR RELINING OF GRAFT 3/11  . Cardioversion     Current Outpatient Prescriptions  Medication Sig Dispense Refill  . acetaminophen (TYLENOL) 500 MG tablet Take 1,000 mg by mouth 2 (two) times daily.        . benazepril (LOTENSIN) 20 MG tablet TAKE 1 TABLET BY MOUTH ONCE DAILY  90 tablet  2  . Calcium  Carbonate-Vit D-Min 600-400 MG-UNIT TABS Take 1 tablet by mouth daily.        . diclofenac sodium (VOLTAREN) 1 % GEL Apply topically. APPLY 4 GRAMS TO AFFECTED AREA TWO TIMES A DAY       . fish oil-omega-3 fatty acids 1000 MG capsule Take 1 g by mouth daily.        . Flaxseed, Linseed, (FLAXSEED OIL) 1200 MG CAPS Take 1 capsule by mouth daily.        . furosemide (LASIX) 40 MG tablet Take 40 mg by mouth 4 (four) times a week.       . Magnesium 250 MG TABS Take 1 tablet by mouth daily.        . Multiple Vitamin (MULTIVITAMIN) tablet Take 1 tablet by mouth daily.        . nitroGLYCERIN (NITROSTAT) 0.4 MG SL tablet Place 0.4 mg under the tongue every 5 (five) minutes as needed.        . potassium chloride (KLOR-CON M10) 10 MEQ tablet Take 10 mEq by mouth 3 (three) times a week.       . simvastatin (ZOCOR) 40 MG tablet TAKE 1/2 TABLET EVERY DAY  45 tablet  1  . TIKOSYN 125 MCG capsule TAKE ONE CAPSULE BY MOUTH TWICE A DAY  180 capsule  3  . warfarin (COUMADIN) 5 MG tablet USE AS DIRECTED  30 tablet  11    Allergies  Allergen Reactions  . Promethazine Hcl     REACTION: world spins..    Review of Systems negative except from HPI and PMH  Physical Exam Well developed and well nourished in no acute distress HENT normal E scleral and icterus clear Neck Supple JVP flat; carotids brisk and full Clear to ausculation Regular rate and rhythm, 2/6 murmur at the Apexapex Soft with active bowel sounds No clubbing cyanosis and edema Alert and oriented, grossly normal motor and sensory function Skin Warm and Dry  ECG Sinus rhythm at 56 Intervals 0.23/0.13/0.44 Axis is -45 IVCD pattern  Assessment and  Plan

## 2011-01-04 NOTE — Assessment & Plan Note (Signed)
Stable post CABG. Denies having any symptoms. We will implant andNot do DVT testing. Myoview scanning will likely be abnormal in the anterior distribution because of his atretic LIMA

## 2011-01-04 NOTE — Assessment & Plan Note (Addendum)
No symptomatic intercurrent events; He remains on Tikosyn and warfarin

## 2011-01-04 NOTE — Patient Instructions (Signed)
Your physician has recommended that you have a defibrillator generator change. Please see the instruction sheet given to you today for more information.     

## 2011-01-05 ENCOUNTER — Ambulatory Visit (INDEPENDENT_AMBULATORY_CARE_PROVIDER_SITE_OTHER): Payer: Medicare Other | Admitting: Internal Medicine

## 2011-01-05 ENCOUNTER — Encounter: Payer: Self-pay | Admitting: Internal Medicine

## 2011-01-05 ENCOUNTER — Encounter (HOSPITAL_COMMUNITY): Payer: Self-pay | Admitting: Pharmacy Technician

## 2011-01-05 VITALS — BP 110/60 | HR 54 | Temp 97.8°F | Wt 188.0 lb

## 2011-01-05 DIAGNOSIS — M545 Low back pain: Secondary | ICD-10-CM

## 2011-01-05 MED ORDER — SIMVASTATIN 40 MG PO TABS: 20.0000 mg | ORAL_TABLET | Freq: Every day | ORAL | Status: DC

## 2011-01-05 NOTE — Progress Notes (Signed)
Remote icd check  

## 2011-01-05 NOTE — Progress Notes (Signed)
Subjective:    Patient ID: [name], male    DOB: 03/15/1926, 77 y.o.   MRN: [id number]  HPI Two month h/o low back pain. He has also been taking APAP on a regular basis that has also helped. There was no radiation to the legs, no paresthesia, no focal symptoms. Sitting made it worse. He has no pain when supine.   He also had some dysuria that was significant. He has increased his fluid intake to a full 1.5 liters. He has seen an improvement in the dysuria.   He has just seen Dr. Graciela Husbands: at end of battery life for AICD. Dr. Graciela Husbands has proposed generator replacement  Past Medical History  Diagnosis Date  . Atrial fibrillation     AFIB  . Ischemic cardiomyopathy     CABG in 1991; last catheter 2005 with patent vein graft to PD, diagonal, marginal/echo 2010 EF 25%.  . implantable cardiac defibrillator -single chamber[V45.02]     [employer]  . Abdominal aneurysm without mention of rupture   . Chronic systolic heart failure     SYSTOLIC .Marland KitchenACUTE ON CHRONIC  . Hypertension   . Peripheral vascular disease, unspecified   . Hemoptysis   . Acute prostatitis   . Labyrinthitis, unspecified   . Unspecified fall   . Unspecified hearing loss   . Cervicalgia   . Personal history of other diseases of digestive system   . Stroke    Past Surgical History  Procedure Date  . Insert / replace / remove pacemaker 2006    AICD GUIDANT VITALITY..IN SITU  . Colectomy 07/15/96  . Colostomy 07/15/96    COLOSTOMY TAKEN DOWN 01/14/97  . Coronary artery bypass graft 03/22/89  . Cardiac catheterization 2001    ANGIOPLASTY W/STENT RCA  . Abdominal aortic aneurysm repair 05/1999    STENT, ENDOVASCULAR RELINING OF GRAFT 3/11  . Cardioversion    Family History  Problem Relation Age of Onset  . Stroke Mother   . Heart attack Father     X 6  . Heart disease Father   . Arthritis Sister     RHEUMATOID  . Heart disease Brother    History   Social History  . Marital Status: Married    Spouse Name:  N/A    Number of Children: 3  . Years of Education: N/A   Occupational History  . CHEMICAL ENGINEERING     [employer]   Social History Main Topics  . Smoking status: Former Smoker -- 22 years    Quit date: 02/15/1971  . Smokeless tobacco: Not on file  . Alcohol Use: No  . Drug Use: No  . Sexually Active: Not on file   Other Topics Concern  . 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     Objective:   Physical Exam Vitals - stable BP Gen'l- WNWD white man with a new moustache HEENT - C&S clear Pulm- normal respirations Cor - 2+ radial pulse MSK - Back exam: normal stand; normal flex to greater than 100 degrees; normal  gait; normal toe/heel walk; normal step up to exam table; normal SLR sitting; normal DTRs at the patellar tendons; normal sensation to light touch, pin-prick and deep vibratory stimulus; no  CVA tenderness; able to move supine to sitting witout assistance.        Assessment & Plan:  Low back pain - exam w/o radiculopathy. He has seen improvement over the past several days. No indication for imaging.  Plan - continue otc NSAIDs, with GI precautions           Back exercise - to do on a daily basis.

## 2011-01-08 ENCOUNTER — Other Ambulatory Visit: Payer: Self-pay | Admitting: Internal Medicine

## 2011-01-13 ENCOUNTER — Other Ambulatory Visit (INDEPENDENT_AMBULATORY_CARE_PROVIDER_SITE_OTHER): Payer: Medicare Other | Admitting: *Deleted

## 2011-01-13 ENCOUNTER — Other Ambulatory Visit: Payer: Self-pay | Admitting: *Deleted

## 2011-01-13 DIAGNOSIS — Z95810 Presence of automatic (implantable) cardiac defibrillator: Secondary | ICD-10-CM

## 2011-01-13 DIAGNOSIS — Z8679 Personal history of other diseases of the circulatory system: Secondary | ICD-10-CM

## 2011-01-13 DIAGNOSIS — E875 Hyperkalemia: Secondary | ICD-10-CM

## 2011-01-13 DIAGNOSIS — I5022 Chronic systolic (congestive) heart failure: Secondary | ICD-10-CM

## 2011-01-13 DIAGNOSIS — I4891 Unspecified atrial fibrillation: Secondary | ICD-10-CM

## 2011-01-13 DIAGNOSIS — I2510 Atherosclerotic heart disease of native coronary artery without angina pectoris: Secondary | ICD-10-CM

## 2011-01-13 LAB — BASIC METABOLIC PANEL
CO2: 29 mEq/L (ref 19–32)
Chloride: 106 mEq/L (ref 96–112)
Glucose, Bld: 100 mg/dL — ABNORMAL HIGH (ref 70–99)
Sodium: 141 mEq/L (ref 135–145)

## 2011-01-13 LAB — CBC WITH DIFFERENTIAL/PLATELET
Basophils Absolute: 0 10*3/uL (ref 0.0–0.1)
Basophils Relative: 0.5 % (ref 0.0–3.0)
Eosinophils Absolute: 0.1 10*3/uL (ref 0.0–0.7)
HCT: 41.3 % (ref 39.0–52.0)
Hemoglobin: 14.2 g/dL (ref 13.0–17.0)
Lymphs Abs: 1 10*3/uL (ref 0.7–4.0)
MCHC: 34.4 g/dL (ref 30.0–36.0)
MCV: 101.7 fl — ABNORMAL HIGH (ref 78.0–100.0)
Monocytes Absolute: 0.5 10*3/uL (ref 0.1–1.0)
Neutro Abs: 4 10*3/uL (ref 1.4–7.7)
RBC: 4.06 Mil/uL — ABNORMAL LOW (ref 4.22–5.81)
RDW: 13.4 % (ref 11.5–14.6)

## 2011-01-17 ENCOUNTER — Other Ambulatory Visit: Payer: Self-pay | Admitting: *Deleted

## 2011-01-17 ENCOUNTER — Telehealth: Payer: Self-pay | Admitting: Internal Medicine

## 2011-01-17 ENCOUNTER — Other Ambulatory Visit (INDEPENDENT_AMBULATORY_CARE_PROVIDER_SITE_OTHER): Payer: Medicare Other | Admitting: *Deleted

## 2011-01-17 DIAGNOSIS — I428 Other cardiomyopathies: Secondary | ICD-10-CM

## 2011-01-17 DIAGNOSIS — I4891 Unspecified atrial fibrillation: Secondary | ICD-10-CM

## 2011-01-17 DIAGNOSIS — E875 Hyperkalemia: Secondary | ICD-10-CM

## 2011-01-17 LAB — BASIC METABOLIC PANEL
CO2: 27 mEq/L (ref 19–32)
Chloride: 105 mEq/L (ref 96–112)
Creatinine, Ser: 1.5 mg/dL (ref 0.4–1.5)
Glucose, Bld: 90 mg/dL (ref 70–99)

## 2011-01-17 LAB — PROTIME-INR: INR: 2.7 ratio — ABNORMAL HIGH (ref 0.8–1.0)

## 2011-01-17 MED ORDER — CEFAZOLIN SODIUM-DEXTROSE 2-3 GM-% IV SOLR
2.0000 g | INTRAVENOUS | Status: DC
  Filled 2011-01-17: qty 50

## 2011-01-17 MED ORDER — SODIUM CHLORIDE 0.9 % IR SOLN
80.0000 mg | Status: DC
  Filled 2011-01-17: qty 2

## 2011-01-17 NOTE — Telephone Encounter (Signed)
Pt had repeat labs today prior to procedure.  Potassium normal now however INR is 2.7.  Pt instructed to not take coumadin tonight and eat greens.  INR will need to be rechecked in the AM.  Pt states understanding.

## 2011-01-17 NOTE — Telephone Encounter (Signed)
New problem Pt is scheduled for a procedure in the morning and hasnt heard anything. Please call him back

## 2011-01-18 ENCOUNTER — Ambulatory Visit (HOSPITAL_COMMUNITY)
Admission: RE | Admit: 2011-01-18 | Discharge: 2011-01-18 | Disposition: A | Discharge status: Home / Self Care | Payer: Medicare Other | Source: Ambulatory Visit | Attending: Internal Medicine | Admitting: Internal Medicine

## 2011-01-18 ENCOUNTER — Encounter (HOSPITAL_COMMUNITY)
Admission: RE | Disposition: A | Discharge status: Home / Self Care | Payer: Self-pay | Source: Ambulatory Visit | Attending: Internal Medicine

## 2011-01-18 DIAGNOSIS — Z95810 Presence of automatic (implantable) cardiac defibrillator: Secondary | ICD-10-CM | POA: Insufficient documentation

## 2011-01-18 DIAGNOSIS — I2510 Atherosclerotic heart disease of native coronary artery without angina pectoris: Secondary | ICD-10-CM | POA: Insufficient documentation

## 2011-01-18 DIAGNOSIS — I2589 Other forms of chronic ischemic heart disease: Secondary | ICD-10-CM

## 2011-01-18 DIAGNOSIS — I5023 Acute on chronic systolic (congestive) heart failure: Secondary | ICD-10-CM | POA: Insufficient documentation

## 2011-01-18 DIAGNOSIS — Z951 Presence of aortocoronary bypass graft: Secondary | ICD-10-CM | POA: Insufficient documentation

## 2011-01-18 DIAGNOSIS — I10 Essential (primary) hypertension: Secondary | ICD-10-CM | POA: Insufficient documentation

## 2011-01-18 DIAGNOSIS — I428 Other cardiomyopathies: Secondary | ICD-10-CM

## 2011-01-18 DIAGNOSIS — I5022 Chronic systolic (congestive) heart failure: Secondary | ICD-10-CM | POA: Insufficient documentation

## 2011-01-18 DIAGNOSIS — Z4502 Encounter for adjustment and management of automatic implantable cardiac defibrillator: Secondary | ICD-10-CM | POA: Insufficient documentation

## 2011-01-18 HISTORY — PX: IMPLANTABLE CARDIOVERTER DEFIBRILLATOR (ICD) GENERATOR CHANGE: SHX5469

## 2011-01-18 LAB — PROTIME-INR: Prothrombin Time: 25.7 seconds — ABNORMAL HIGH (ref 11.6–15.2)

## 2011-01-18 SURGERY — ICD GENERATOR CHANGE
Anesthesia: LOCAL

## 2011-01-18 MED ORDER — ACETAMINOPHEN 500 MG PO TABS: 1000.0000 mg | ORAL_TABLET | Freq: Four times a day (QID) | ORAL | Status: DC

## 2011-01-18 MED ORDER — ACETAMINOPHEN 325 MG PO TABS: 325.0000 mg | ORAL_TABLET | ORAL | Status: DC | PRN

## 2011-01-18 MED ORDER — SODIUM CHLORIDE 0.9 % IV SOLN: INTRAVENOUS | Status: DC

## 2011-01-18 MED ORDER — MIDAZOLAM HCL 5 MG/5ML IJ SOLN
INTRAMUSCULAR | Status: AC
  Filled 2011-01-18: qty 5

## 2011-01-18 MED ORDER — CHLORHEXIDINE GLUCONATE 4 % EX LIQD
60.0000 mL | Freq: Once | CUTANEOUS | Status: DC
  Filled 2011-01-18: qty 60

## 2011-01-18 MED ORDER — ONDANSETRON HCL 4 MG/2ML IJ SOLN: 4.0000 mg | Freq: Four times a day (QID) | INTRAMUSCULAR | Status: DC | PRN

## 2011-01-18 MED ORDER — FENTANYL CITRATE 0.05 MG/ML IJ SOLN
INTRAMUSCULAR | Status: AC
  Filled 2011-01-18: qty 2

## 2011-01-18 MED ORDER — LIDOCAINE HCL (PF) 1 % IJ SOLN
INTRAMUSCULAR | Status: AC
  Filled 2011-01-18: qty 60

## 2011-01-18 MED ORDER — MUPIROCIN 2 % EX OINT
TOPICAL_OINTMENT | CUTANEOUS | Status: AC
  Administered 2011-01-18: 1 via NASAL
  Filled 2011-01-18: qty 22

## 2011-01-18 MED ORDER — SODIUM CHLORIDE 0.9 % IJ SOLN: 3.0000 mL | INTRAMUSCULAR | Status: DC | PRN

## 2011-01-18 MED ORDER — SODIUM CHLORIDE 0.9 % IV SOLN
INTRAVENOUS | Status: DC
  Administered 2011-01-18: 06:00:00 via INTRAVENOUS

## 2011-01-18 MED ORDER — SODIUM CHLORIDE 0.9 % IV SOLN: 250.0000 mL | INTRAVENOUS | Status: DC | PRN

## 2011-01-18 NOTE — H&P (View-Only) (Signed)
HPI  [name] is a 77 y.o. male Seen in followup for ischemic cardiomyopathy And status post CABG in 1991. Catheterization 2005 demonstrated patent vein grafts. Echo cardiogram 2010 demonstrated stable left ventricular function at 25%.  The patient denies chest pain, shortness of breath, nocturnal dyspnea, orthopnea or peripheral edema.  There have been no palpitations, lightheadedness or syncope.   He is not a recurrent problems with shortness of breath since having changed his diet to a low sodium diet. 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 Atrial fibrillation     AFIB  . Ischemic cardiomyopathy     CABG in 1991; last catheter 2005 with patent vein graft to PD, diagonal, marginal/echo 2010 EF 25%.  . Automatic implantable cardiac defibrillator     [employer]  . Abdominal aneurysm without mention of rupture   . Chronic systolic heart failure     SYSTOLIC ..ACUTE ON CHRONIC  . Hypertension   . Peripheral vascular disease, unspecified   . Hemoptysis   . Acute prostatitis   . Labyrinthitis, unspecified   . Unspecified fall   . Unspecified hearing loss   . Cervicalgia   . Personal history of other diseases of digestive system   . Stroke     Past Surgical History  Procedure Date  . Insert / replace / remove pacemaker 2006    AICD GUIDANT VITALITY..IN SITU  . Colectomy 07/15/96  . Colostomy 07/15/96    COLOSTOMY TAKEN DOWN 01/14/97  . Coronary artery bypass graft 03/22/89  . Cardiac catheterization 2001    ANGIOPLASTY W/STENT RCA  . Abdominal aortic aneurysm repair 05/1999    STENT, ENDOVASCULAR RELINING OF GRAFT 3/11  . Cardioversion     Current Outpatient Prescriptions  Medication Sig Dispense Refill  . acetaminophen (TYLENOL) 500 MG tablet Take 1,000 mg by mouth 2 (two) times daily.        . benazepril (LOTENSIN) 20 MG tablet TAKE 1 TABLET BY MOUTH ONCE DAILY  90 tablet  2  . Calcium  Carbonate-Vit D-Min 600-400 MG-UNIT TABS Take 1 tablet by mouth daily.        . diclofenac sodium (VOLTAREN) 1 % GEL Apply topically. APPLY 4 GRAMS TO AFFECTED AREA TWO TIMES A DAY       . fish oil-omega-3 fatty acids 1000 MG capsule Take 1 g by mouth daily.        . Flaxseed, Linseed, (FLAXSEED OIL) 1200 MG CAPS Take 1 capsule by mouth daily.        . furosemide (LASIX) 40 MG tablet Take 40 mg by mouth 4 (four) times a week.       . Magnesium 250 MG TABS Take 1 tablet by mouth daily.        . Multiple Vitamin (MULTIVITAMIN) tablet Take 1 tablet by mouth daily.        . nitroGLYCERIN (NITROSTAT) 0.4 MG SL tablet Place 0.4 mg under the tongue every 5 (five) minutes as needed.        . potassium chloride (KLOR-CON M10) 10 MEQ tablet Take 10 mEq by mouth 3 (three) times a week.       . simvastatin (ZOCOR) 40 MG tablet TAKE 1/2 TABLET EVERY DAY  45 tablet  1  . TIKOSYN 125 MCG capsule TAKE ONE CAPSULE BY MOUTH TWICE A DAY  180 capsule  3  . warfarin (COUMADIN) 5 MG tablet USE AS DIRECTED    30 tablet  11    Allergies  Allergen Reactions  . Promethazine Hcl     REACTION: world spins..    Review of Systems negative except from HPI and PMH  Physical Exam Well developed and well nourished in no acute distress HENT normal E scleral and icterus clear Neck Supple JVP flat; carotids brisk and full Clear to ausculation Regular rate and rhythm, 2/6 murmur at the Apexapex Soft with active bowel sounds No clubbing cyanosis and edema Alert and oriented, grossly normal motor and sensory function Skin Warm and Dry  ECG Sinus rhythm at 56 Intervals 0.23/0.13/0.44 Axis is -45 IVCD pattern  Assessment and  Plan  

## 2011-01-18 NOTE — Interval H&P Note (Signed)
History and Physical Interval Note:  01/18/2011 8:20 AM  [name]  has presented today for surgery, with the diagnosis of ICD  End of Life  The various methods of treatment have been discussed with the patient and family. After consideration of risks, benefits and other options for treatment, the patient has consented to  Procedure(s): ICD GENERATOR CHANGE as a surgical intervention .  The patients' history has been reviewed, patient examined, no change in status, stable for surgery.  I have reviewed the patients' chart and labs.  Questions were answered to the patient's satisfaction.     Sherryl Manges

## 2011-01-18 NOTE — Brief Op Note (Signed)
01/18/2011  9:51 AM  PATIENT:  [name]  77 y.o. male  PRE-OPERATIVE DIAGNOSIS:  End of Life; ICD  POST-OPERATIVE DIAGNOSIS:  As above PROCEDURE:  Procedure(s): ICD GENERATOR CHANGE  SURGEON:  Surgeon(s): Duke Salvia, MD  PHYSICIAN ASSISTANT:   ASSISTANTS: none   ANESTHESIA:   local and IV sedation  EBL:   ,15cc  BLOOD ADMINISTERED:none  DRAINS: none   LOCAL MEDICATIONS USED:  XYLOCAINE <10 DICTATION: .Other Dictation: Dictation Number [id number] *8  PLAN OF CARE: Discharge to home after PACU  PATIENT DISPOSITION:  PACU - hemodynamically stable.   Delay start of Pharmacological VTE agent (>24hrs) due to surgical blood loss or risk of bleeding:  {YES/NO/NOT APPLICABLE:20182

## 2011-01-18 NOTE — Op Note (Signed)
NAMENILAY, MANGRUM NO.:  [id number]  MEDICAL RECORD NO.:  [id number]  LOCATION:  [facility]                         FACILITY:  [facility]  PHYSICIAN:  Duke Salvia, MD, FACCDATE OF BIRTH:  Nov 07, 1926  DATE OF PROCEDURE: DATE OF DISCHARGE:                              OPERATIVE REPORT   PREOPERATIVE DIAGNOSIS:  Ischemic cardiomyopathy previously implanted implantable cardioverter defibrillator now at end-of-life.  POSTOPERATIVE DIAGNOSIS:  Ischemic cardiomyopathy previously implanted implantable cardioverter defibrillator now at end-of-life.  PROCEDURE:  Explantation of the device, expansion of the pocket, and implantation of device, and high-voltage testing.  DESCRIPTION OF PROCEDURE:  Following obtaining informed consent, the patient was brought to the electrophysiology laboratory and placed on the fluoroscopic table in supine position.  After routine prep and drape of the left upper chest, lidocaine was infiltrated about 1 cm caudal from the previous incision as there had been downward migration of the device.  The pocket was opened.  The device was explanted.  Heme discoloration of the insulation was noted.  The pocket was extended medially and caudally for housing of the larger device.  Hemostasis was obtained.  Interrogation of the previously implanted 0158 lead demonstrated an amplitude of 8 with a pace impedance of 590, a threshold of 0.7 at 0.5. Proximal coil impedance was 592, distal coil impedance was 594.  With these acceptable parameters recorded, the lead was attached to a [employer] ICD, model E141, serial number [id number].  Through the device, bipolar R-wave was 6.2 with a pace impedance of 490, threshold of 0.7 at 0.4.  High-voltage impedance was 45 ohms.  A 1.1 joule shock was delivered synchronously into sinus rhythm.  The impedance was 30 ohms.  The patient tolerated this without apparent complication.  The pocket was copiously  irrigated with antibiotic containing saline solution.  Hemostasis was assured.  Leads and the pulse generator were placed in the pocket and secured to the prepectoral fascia.  The wound was closed in 3 layers in normal fashion.  The wound was washed, dried, and a benzoin and Steri-Strips dressing was applied. Needle counts, sponge counts, and instrument counts were correct at the end of the procedure according to the staff.  The patient tolerated the procedure without apparent complication.     Duke Salvia, MD, [facility]    SCK/MEDQ  D:  01/18/2011  T:  01/18/2011  Job:  [id number]

## 2011-01-25 ENCOUNTER — Ambulatory Visit (INDEPENDENT_AMBULATORY_CARE_PROVIDER_SITE_OTHER): Payer: Medicare Other | Admitting: Internal Medicine

## 2011-01-25 ENCOUNTER — Encounter: Payer: Self-pay | Admitting: Internal Medicine

## 2011-01-25 VITALS — BP 112/70 | HR 59 | Temp 97.6°F | Resp 14 | Ht 74.0 in | Wt 191.8 lb

## 2011-01-25 DIAGNOSIS — L909 Atrophic disorder of skin, unspecified: Secondary | ICD-10-CM

## 2011-01-25 DIAGNOSIS — L918 Other hypertrophic disorders of the skin: Secondary | ICD-10-CM

## 2011-01-25 NOTE — Progress Notes (Signed)
  Subjective:    Patient ID: [name], male    DOB: 11/18/1926, 77 y.o.   MRN: [id number]  HPI [name] presents for removal of a large skin tag at the left low back just at the belt line. This has been present for a long time but has been getting larger and the location adds to irritation.  In the interval since his last visit he has undergone new generator placement for his AICD. This went very well. He has been managing dressing changes with the help of his daughter.  I have reviewed the patient's medical history in detail and updated the computerized patient record.    Review of Systems  Constitutional: Negative.   HENT: Negative.   Respiratory: Negative.   Cardiovascular: Negative.   Neurological: Negative.   Hematological: Negative.        Objective:   Physical Exam Vitals noted and stable Gen'l- WNWD white man in no distress, in good spirits Chest - AICD pocket left upper chest - wound looks well approximate and closed, no erythema, no drainage. Derm - large skin tag with 4mm base on the left low back, flesh colored and uniform.  Procedure: skin tag(s) removal 4mm base Location(s): left lower back Indication: chronic irritation due to location at belt line Consent: after patient is informed of the risks of procedure including bleeding, infection, scarring a written informed consent is signed. Pt aware of increased risk due to coumadin Anesthesia: 2% xylocaine w/ epi   Prep: betadiene Excision: using derma blade   Wound care: base cauterized with hyrecator at 14 After care: routine cleaning with soap and water. No bandage required Call back for fever, bleeding, purulent drainage, increasing erythema or pain.        Assessment & Plan:  Skin tag removal -  Uncomplicated. Patient tolerated procedure well.

## 2011-01-27 ENCOUNTER — Encounter: Payer: Self-pay | Admitting: Internal Medicine

## 2011-01-27 ENCOUNTER — Encounter: Payer: Self-pay | Admitting: *Deleted

## 2011-01-27 ENCOUNTER — Ambulatory Visit (INDEPENDENT_AMBULATORY_CARE_PROVIDER_SITE_OTHER): Payer: Medicare Other | Admitting: *Deleted

## 2011-01-27 DIAGNOSIS — I428 Other cardiomyopathies: Secondary | ICD-10-CM

## 2011-01-27 DIAGNOSIS — I255 Ischemic cardiomyopathy: Secondary | ICD-10-CM | POA: Insufficient documentation

## 2011-01-27 NOTE — Progress Notes (Signed)
Wound check-ICD 

## 2011-01-28 ENCOUNTER — Telehealth: Payer: Self-pay | Admitting: *Deleted

## 2011-01-28 DIAGNOSIS — I4891 Unspecified atrial fibrillation: Secondary | ICD-10-CM

## 2011-01-28 NOTE — Telephone Encounter (Signed)
Paula from device clinic asked me to call the patient to discuss his potassium with him. He had questions regarding this when he came in for his device check. In speaking with the patient, his concern is that we had held his potassium prior to his procedure due to a reading of 5.7 on potassium three times a week. After holding, he had normalized. Per the patient, his discharge instructions had him back on his previous dose of potassium at three times a week. He does take furosemide four times a week. The patient states he has only taken his potassium twice this week. I advised that this is reasonable, but that we should recheck his potassium level when he comes back in for his INR on 12/18. He is agreeable with this.

## 2011-02-01 ENCOUNTER — Other Ambulatory Visit (INDEPENDENT_AMBULATORY_CARE_PROVIDER_SITE_OTHER): Payer: Medicare Other | Admitting: *Deleted

## 2011-02-01 ENCOUNTER — Ambulatory Visit (INDEPENDENT_AMBULATORY_CARE_PROVIDER_SITE_OTHER): Payer: Medicare Other | Admitting: *Deleted

## 2011-02-01 DIAGNOSIS — Z7901 Long term (current) use of anticoagulants: Secondary | ICD-10-CM

## 2011-02-01 DIAGNOSIS — I4891 Unspecified atrial fibrillation: Secondary | ICD-10-CM

## 2011-02-01 DIAGNOSIS — Z8679 Personal history of other diseases of the circulatory system: Secondary | ICD-10-CM

## 2011-02-01 LAB — BASIC METABOLIC PANEL
CO2: 29 mEq/L (ref 19–32)
Calcium: 9.2 mg/dL (ref 8.4–10.5)
Chloride: 106 mEq/L (ref 96–112)
Creatinine, Ser: 1.4 mg/dL (ref 0.4–1.5)
Glucose, Bld: 83 mg/dL (ref 70–99)

## 2011-02-01 LAB — POCT INR: INR: 2.3

## 2011-02-21 ENCOUNTER — Telehealth: Payer: Self-pay | Admitting: Internal Medicine

## 2011-02-21 ENCOUNTER — Other Ambulatory Visit: Payer: Self-pay

## 2011-02-21 MED ORDER — DICLOFENAC SODIUM 1 % TD GEL: 1.0000 "application " | Freq: Every day | TRANSDERMAL | Status: DC | PRN

## 2011-02-21 NOTE — Telephone Encounter (Signed)
Pt has question re dosage of klor-con

## 2011-02-21 NOTE — Telephone Encounter (Signed)
Spoke with pt, he is taking potassium just one tablet twice weekly-(tuesday and Saturday). He is aware of the labs done 02-01-11 and he wonders if he needs to stop the potassium completely because he at the high level of normal. Will forward to heather to further discuss with dr Graciela Husbands

## 2011-02-22 NOTE — Telephone Encounter (Signed)
The patient's potassium was at 5.1 on 02/01/11. Will forward to Dr. Graciela Husbands for review and recommendations.

## 2011-02-23 ENCOUNTER — Telehealth: Payer: Self-pay | Admitting: *Deleted

## 2011-02-23 DIAGNOSIS — I4891 Unspecified atrial fibrillation: Secondary | ICD-10-CM

## 2011-02-23 DIAGNOSIS — Z79899 Other long term (current) drug therapy: Secondary | ICD-10-CM

## 2011-02-23 NOTE — Telephone Encounter (Signed)
02/23/11--[name] calling asking if he should limit his K+ anymore--K+ on 02/01/11 was 5.1--heather sent to dr Graciela Husbands to review, no response from dr Graciela Husbands as of yet--advised pt to continue present dose of K and i will forward message to dr Graciela Husbands and Herbert Seta again--pt agrees--nt

## 2011-02-23 NOTE — Telephone Encounter (Signed)
Follow-up:    Patient called following up on the call placed on 02/21/11 and would like to know what to do about his potassium.

## 2011-02-24 NOTE — Telephone Encounter (Signed)
Not on MD desktop. I will resend to Dr. Graciela Husbands for review.

## 2011-02-25 NOTE — Telephone Encounter (Signed)
Per Dr. Graciela Husbands, he called and spoke with the patient and gave orders to stop his potassium and recheck his bmp in 2 weeks.

## 2011-02-25 NOTE — Telephone Encounter (Signed)
Lets check K one more time and then will make decision

## 2011-03-01 ENCOUNTER — Telehealth: Payer: Self-pay | Admitting: Internal Medicine

## 2011-03-01 NOTE — Telephone Encounter (Signed)
Spoke with patient. Lab scheduled.

## 2011-03-01 NOTE — Telephone Encounter (Signed)
New msg Pt said he needs his potassium level checked in two weeks and wanted to know if it was possible to have it done on January 29 please let him know

## 2011-03-01 NOTE — Telephone Encounter (Signed)
Addended by: Sherri Rad C on: 03/01/2011 09:13 AM   Modules accepted: Orders

## 2011-03-01 NOTE — Telephone Encounter (Signed)
I spoke with the patient. He will be here on 1/29 for a coumadin check. We will recheck his BMP then.

## 2011-03-15 ENCOUNTER — Other Ambulatory Visit (INDEPENDENT_AMBULATORY_CARE_PROVIDER_SITE_OTHER): Payer: Medicare Other | Admitting: *Deleted

## 2011-03-15 ENCOUNTER — Ambulatory Visit (INDEPENDENT_AMBULATORY_CARE_PROVIDER_SITE_OTHER): Payer: Medicare Other

## 2011-03-15 DIAGNOSIS — I4891 Unspecified atrial fibrillation: Secondary | ICD-10-CM

## 2011-03-15 DIAGNOSIS — Z8679 Personal history of other diseases of the circulatory system: Secondary | ICD-10-CM

## 2011-03-15 DIAGNOSIS — Z79899 Other long term (current) drug therapy: Secondary | ICD-10-CM

## 2011-03-15 DIAGNOSIS — Z7901 Long term (current) use of anticoagulants: Secondary | ICD-10-CM

## 2011-03-15 LAB — BASIC METABOLIC PANEL
BUN: 23 mg/dL (ref 6–23)
CO2: 29 mEq/L (ref 19–32)
GFR: 47.89 mL/min — ABNORMAL LOW (ref >60.00)
Glucose, Bld: 86 mg/dL (ref 70–99)
Potassium: 4.7 mEq/L (ref 3.5–5.1)
Sodium: 138 mEq/L (ref 135–145)

## 2011-03-21 ENCOUNTER — Encounter: Payer: Self-pay | Admitting: *Deleted

## 2011-04-07 ENCOUNTER — Encounter: Payer: Medicare Other | Admitting: *Deleted

## 2011-04-12 ENCOUNTER — Telehealth: Payer: Self-pay | Admitting: *Deleted

## 2011-04-12 NOTE — Telephone Encounter (Signed)
Left msg on vm want to ask md does him & his wife need to get the TDAP. Having a new grand baby.... 04/12/11@ 3:35pm/LMB

## 2011-04-12 NOTE — Telephone Encounter (Signed)
It is recommended by the CDC and is a good idea. To my understanding Medicare does not cover it so it would out of pocket.

## 2011-04-13 ENCOUNTER — Ambulatory Visit (INDEPENDENT_AMBULATORY_CARE_PROVIDER_SITE_OTHER): Payer: Medicare Other | Admitting: *Deleted

## 2011-04-13 DIAGNOSIS — Z23 Encounter for immunization: Secondary | ICD-10-CM

## 2011-04-13 NOTE — Telephone Encounter (Signed)
Notified pt with md response. Pt agreed to pay if insurance wouldn't cover transferred to schedulers for appt... 04/13/11@ 9:47am/LMB

## 2011-04-20 ENCOUNTER — Other Ambulatory Visit: Payer: Self-pay | Admitting: Internal Medicine

## 2011-04-28 ENCOUNTER — Encounter: Payer: Self-pay | Admitting: Internal Medicine

## 2011-04-28 ENCOUNTER — Ambulatory Visit (INDEPENDENT_AMBULATORY_CARE_PROVIDER_SITE_OTHER): Payer: Medicare Other | Admitting: *Deleted

## 2011-04-28 DIAGNOSIS — I428 Other cardiomyopathies: Secondary | ICD-10-CM

## 2011-04-29 ENCOUNTER — Encounter: Payer: Self-pay | Admitting: Internal Medicine

## 2011-04-29 ENCOUNTER — Ambulatory Visit (INDEPENDENT_AMBULATORY_CARE_PROVIDER_SITE_OTHER): Payer: Medicare Other | Admitting: Pharmacist

## 2011-04-29 DIAGNOSIS — Z8679 Personal history of other diseases of the circulatory system: Secondary | ICD-10-CM

## 2011-04-29 DIAGNOSIS — I4891 Unspecified atrial fibrillation: Secondary | ICD-10-CM

## 2011-04-29 DIAGNOSIS — Z7901 Long term (current) use of anticoagulants: Secondary | ICD-10-CM

## 2011-04-29 LAB — REMOTE ICD DEVICE
BRDY-0002RV: 40 {beats}/min
CHARGE TIME: 9.2 s
DEVICE MODEL ICD: 119203
RV LEAD AMPLITUDE: 5.8 mv
TOT-0006: 20121213000000
TZAT-0001FASTVT: 1
TZAT-0002SLOWVT: NEGATIVE
TZAT-0013FASTVT: 2
TZAT-0018FASTVT: NEGATIVE
TZAT-0018FASTVT: NEGATIVE
TZAT-0018SLOWVT: NEGATIVE
TZST-0001FASTVT: 3
TZST-0001FASTVT: 4
TZST-0001FASTVT: 7
TZST-0001SLOWVT: 3
TZST-0001SLOWVT: 6
TZST-0001SLOWVT: 7
TZST-0002SLOWVT: NEGATIVE
TZST-0002SLOWVT: NEGATIVE
TZST-0003FASTVT: 26 J
TZST-0003FASTVT: 41 J
TZST-0003FASTVT: 41 J
VENTRICULAR PACING ICD: 0 pct
VF: 0

## 2011-05-12 NOTE — Progress Notes (Signed)
Remote icd check  

## 2011-05-20 ENCOUNTER — Encounter: Payer: Self-pay | Admitting: *Deleted

## 2011-06-10 ENCOUNTER — Ambulatory Visit (INDEPENDENT_AMBULATORY_CARE_PROVIDER_SITE_OTHER): Payer: Medicare Other

## 2011-06-10 DIAGNOSIS — Z8679 Personal history of other diseases of the circulatory system: Secondary | ICD-10-CM

## 2011-06-10 DIAGNOSIS — I4891 Unspecified atrial fibrillation: Secondary | ICD-10-CM

## 2011-06-10 DIAGNOSIS — Z7901 Long term (current) use of anticoagulants: Secondary | ICD-10-CM

## 2011-07-03 ENCOUNTER — Other Ambulatory Visit: Payer: Self-pay | Admitting: Internal Medicine

## 2011-07-26 ENCOUNTER — Ambulatory Visit (INDEPENDENT_AMBULATORY_CARE_PROVIDER_SITE_OTHER): Payer: Medicare Other | Admitting: *Deleted

## 2011-07-26 DIAGNOSIS — I4891 Unspecified atrial fibrillation: Secondary | ICD-10-CM

## 2011-07-26 DIAGNOSIS — Z8679 Personal history of other diseases of the circulatory system: Secondary | ICD-10-CM

## 2011-07-26 DIAGNOSIS — Z7901 Long term (current) use of anticoagulants: Secondary | ICD-10-CM

## 2011-08-04 ENCOUNTER — Ambulatory Visit (INDEPENDENT_AMBULATORY_CARE_PROVIDER_SITE_OTHER): Payer: Medicare Other | Admitting: *Deleted

## 2011-08-04 ENCOUNTER — Encounter: Payer: Self-pay | Admitting: Internal Medicine

## 2011-08-04 DIAGNOSIS — I428 Other cardiomyopathies: Secondary | ICD-10-CM

## 2011-08-09 LAB — REMOTE ICD DEVICE
BRDY-0002RV: 40 {beats}/min
DEVICE MODEL ICD: 119203
HV IMPEDENCE: 47 Ohm
RV LEAD AMPLITUDE: 5.7 mv
TOT-0006: 20130613000000
TZAT-0001FASTVT: 1
TZAT-0001SLOWVT: 1
TZAT-0002SLOWVT: NEGATIVE
TZAT-0013FASTVT: 2
TZAT-0018FASTVT: NEGATIVE
TZAT-0018SLOWVT: NEGATIVE
TZAT-0018SLOWVT: NEGATIVE
TZST-0001FASTVT: 3
TZST-0001FASTVT: 4
TZST-0001FASTVT: 6
TZST-0001SLOWVT: 3
TZST-0001SLOWVT: 4
TZST-0001SLOWVT: 7
TZST-0002SLOWVT: NEGATIVE
TZST-0002SLOWVT: NEGATIVE
TZST-0002SLOWVT: NEGATIVE
TZST-0002SLOWVT: NEGATIVE
TZST-0003FASTVT: 26 J
TZST-0003FASTVT: 41 J
TZST-0003FASTVT: 41 J
VENTRICULAR PACING ICD: 0 pct

## 2011-08-26 ENCOUNTER — Encounter: Payer: Self-pay | Admitting: *Deleted

## 2011-09-06 ENCOUNTER — Ambulatory Visit (INDEPENDENT_AMBULATORY_CARE_PROVIDER_SITE_OTHER): Payer: Medicare Other

## 2011-09-06 DIAGNOSIS — I4891 Unspecified atrial fibrillation: Secondary | ICD-10-CM

## 2011-09-06 DIAGNOSIS — Z8679 Personal history of other diseases of the circulatory system: Secondary | ICD-10-CM

## 2011-09-06 DIAGNOSIS — Z7901 Long term (current) use of anticoagulants: Secondary | ICD-10-CM

## 2011-09-06 LAB — POCT INR: INR: 2.9

## 2011-10-04 ENCOUNTER — Other Ambulatory Visit: Payer: Self-pay | Admitting: Internal Medicine

## 2011-10-19 ENCOUNTER — Ambulatory Visit (INDEPENDENT_AMBULATORY_CARE_PROVIDER_SITE_OTHER): Payer: Medicare Other | Admitting: Pharmacist

## 2011-10-19 DIAGNOSIS — Z8679 Personal history of other diseases of the circulatory system: Secondary | ICD-10-CM

## 2011-10-19 DIAGNOSIS — I4891 Unspecified atrial fibrillation: Secondary | ICD-10-CM

## 2011-10-19 DIAGNOSIS — Z7901 Long term (current) use of anticoagulants: Secondary | ICD-10-CM

## 2011-11-03 ENCOUNTER — Other Ambulatory Visit: Payer: Self-pay

## 2011-11-03 ENCOUNTER — Other Ambulatory Visit: Payer: Self-pay | Admitting: Internal Medicine

## 2011-11-03 ENCOUNTER — Ambulatory Visit (INDEPENDENT_AMBULATORY_CARE_PROVIDER_SITE_OTHER): Payer: Medicare Other | Admitting: *Deleted

## 2011-11-03 ENCOUNTER — Encounter: Payer: Self-pay | Admitting: Internal Medicine

## 2011-11-03 DIAGNOSIS — I428 Other cardiomyopathies: Secondary | ICD-10-CM

## 2011-11-09 LAB — REMOTE ICD DEVICE
BRDY-0002RV: 40 {beats}/min
CHARGE TIME: 9.4 s
DEV-0020ICD: NEGATIVE
DEVICE MODEL ICD: 119203
RV LEAD AMPLITUDE: 6.1 mv
TOT-0006: 20130620000000
TZAT-0001FASTVT: 1
TZAT-0002SLOWVT: NEGATIVE
TZAT-0013FASTVT: 2
TZAT-0018FASTVT: NEGATIVE
TZAT-0018FASTVT: NEGATIVE
TZAT-0018SLOWVT: NEGATIVE
TZON-0003SLOWVT: 375 ms
TZST-0001FASTVT: 3
TZST-0001FASTVT: 4
TZST-0001FASTVT: 7
TZST-0001SLOWVT: 3
TZST-0001SLOWVT: 6
TZST-0001SLOWVT: 7
TZST-0002SLOWVT: NEGATIVE
TZST-0002SLOWVT: NEGATIVE
TZST-0003FASTVT: 41 J
TZST-0003FASTVT: 41 J
VENTRICULAR PACING ICD: 0 pct
VF: 0

## 2011-11-11 ENCOUNTER — Other Ambulatory Visit: Payer: Self-pay | Admitting: Internal Medicine

## 2011-11-23 ENCOUNTER — Encounter: Payer: Self-pay | Admitting: *Deleted

## 2011-11-29 ENCOUNTER — Ambulatory Visit (INDEPENDENT_AMBULATORY_CARE_PROVIDER_SITE_OTHER): Payer: Medicare Other | Admitting: *Deleted

## 2011-11-29 DIAGNOSIS — I4891 Unspecified atrial fibrillation: Secondary | ICD-10-CM

## 2011-11-29 DIAGNOSIS — Z8679 Personal history of other diseases of the circulatory system: Secondary | ICD-10-CM

## 2011-11-29 DIAGNOSIS — Z7901 Long term (current) use of anticoagulants: Secondary | ICD-10-CM

## 2011-12-20 ENCOUNTER — Encounter: Payer: Self-pay | Admitting: Vascular Surgery

## 2011-12-27 ENCOUNTER — Other Ambulatory Visit: Payer: Self-pay | Admitting: Internal Medicine

## 2012-01-10 ENCOUNTER — Other Ambulatory Visit: Payer: Self-pay | Admitting: Internal Medicine

## 2012-01-10 ENCOUNTER — Ambulatory Visit (INDEPENDENT_AMBULATORY_CARE_PROVIDER_SITE_OTHER): Payer: Medicare Other | Admitting: *Deleted

## 2012-01-10 DIAGNOSIS — Z8679 Personal history of other diseases of the circulatory system: Secondary | ICD-10-CM

## 2012-01-10 DIAGNOSIS — Z7901 Long term (current) use of anticoagulants: Secondary | ICD-10-CM

## 2012-01-10 DIAGNOSIS — I4891 Unspecified atrial fibrillation: Secondary | ICD-10-CM

## 2012-01-10 LAB — POCT INR: INR: 2

## 2012-01-30 ENCOUNTER — Encounter: Payer: Self-pay | Admitting: Internal Medicine

## 2012-01-30 ENCOUNTER — Ambulatory Visit (INDEPENDENT_AMBULATORY_CARE_PROVIDER_SITE_OTHER): Payer: Medicare Other | Admitting: Internal Medicine

## 2012-01-30 VITALS — BP 122/68 | HR 63 | Temp 97.9°F | Resp 10 | Wt 190.1 lb

## 2012-01-30 DIAGNOSIS — E785 Hyperlipidemia, unspecified: Secondary | ICD-10-CM

## 2012-01-30 DIAGNOSIS — I5022 Chronic systolic (congestive) heart failure: Secondary | ICD-10-CM

## 2012-01-30 DIAGNOSIS — M1990 Unspecified osteoarthritis, unspecified site: Secondary | ICD-10-CM

## 2012-01-30 MED ORDER — DICLOFENAC SODIUM 1 % TD GEL: 1.0000 "application " | Freq: Every day | TRANSDERMAL | Status: DC | PRN

## 2012-01-30 NOTE — Patient Instructions (Addendum)
Thanks for coming in.  Your nuckle and shoulder are victims of [place]. Continue to use the voltaren Gel  We'll set up a wellness exam today.  Happy Holiday

## 2012-01-30 NOTE — Progress Notes (Signed)
  Subjective:    Patient ID: [name], male    DOB: April 30, 1926, 78 y.o.   MRN: [id number]  HPI Mr. Kielbasa presents for pain in the right MCP and also some shoulder pain. He gets relief with voltaren Gel   PMH, FamHx and SocHx reviewed for any changes and relevance.  Current Outpatient Prescriptions on File Prior to Visit  Medication Sig Dispense Refill  . acetaminophen (TYLENOL) 500 MG tablet Take 500 mg by mouth 2 (two) times daily.       . benazepril (LOTENSIN) 20 MG tablet TAKE 1 TABLET BY MOUTH ONCE DAILY  90 tablet  0  . Calcium Carbonate-Vit D-Min 600-400 MG-UNIT TABS Take 1 tablet by mouth every evening.       . fish oil-omega-3 fatty acids 1000 MG capsule Take 1 g by mouth daily.        . Flaxseed, Linseed, (FLAXSEED OIL) 1200 MG CAPS Take 1 capsule by mouth daily.        . furosemide (LASIX) 40 MG tablet Take 40 mg by mouth 4 (four) times a week. Monday,tuesday,thursday, and saturday      . Magnesium 250 MG TABS Take 1 tablet by mouth every evening.       . Multiple Vitamin (MULTIVITAMIN) tablet Take 1 tablet by mouth daily.       . nitroGLYCERIN (NITROSTAT) 0.4 MG SL tablet Place 0.4 mg under the tongue every 5 (five) minutes as needed. For chest pain      . simvastatin (ZOCOR) 40 MG tablet TAKE 1/2 TABLET EVERY DAY  45 tablet  0  . TIKOSYN 125 MCG capsule TAKE ONE CAPSULE BY MOUTH TWICE A DAY  180 capsule  3  . warfarin (COUMADIN) 5 MG tablet Take 2.5-5 mg by mouth every evening. Take 1 tablet on all days except Friday take 0.5 tablet       . warfarin (COUMADIN) 5 MG tablet USE AS DIRECTED  30 tablet  3      Review of Systems System review is negative for any constitutional, cardiac, pulmonary, GI or neuro symptoms or complaints other than as described in the HPI.     Objective:   Physical Exam Filed Vitals:   01/30/12 0927  BP: 122/68  Pulse: 63  Temp: 97.9 F (36.6 C)  Resp: 10   Genb'l- WNWD  Cor- RRR Pulm - normal respirations MSK - right 1st MCP enlarged  w/o erythema, full ROM. Left shoulder - normal ROM       Assessment & Plan:  OA - pain in the 2nd MCP right and pain in the left shoulder c/w OA that is not limiting  Plan OK to continue with topical Voltaren gel.

## 2012-01-31 ENCOUNTER — Encounter: Payer: Self-pay | Admitting: Internal Medicine

## 2012-01-31 ENCOUNTER — Ambulatory Visit (INDEPENDENT_AMBULATORY_CARE_PROVIDER_SITE_OTHER): Payer: Medicare Other | Admitting: Internal Medicine

## 2012-01-31 VITALS — BP 114/68 | HR 62 | Ht 74.0 in | Wt 191.0 lb

## 2012-01-31 DIAGNOSIS — E785 Hyperlipidemia, unspecified: Secondary | ICD-10-CM

## 2012-01-31 DIAGNOSIS — I255 Ischemic cardiomyopathy: Secondary | ICD-10-CM

## 2012-01-31 DIAGNOSIS — I2589 Other forms of chronic ischemic heart disease: Secondary | ICD-10-CM

## 2012-01-31 DIAGNOSIS — I428 Other cardiomyopathies: Secondary | ICD-10-CM

## 2012-01-31 DIAGNOSIS — I4891 Unspecified atrial fibrillation: Secondary | ICD-10-CM

## 2012-01-31 LAB — ICD DEVICE OBSERVATION
HV IMPEDENCE: 50 Ohm
TZAT-0001FASTVT: 1
TZAT-0001SLOWVT: 1
TZAT-0002SLOWVT: NEGATIVE
TZAT-0002SLOWVT: NEGATIVE
TZAT-0013FASTVT: 2
TZAT-0018FASTVT: NEGATIVE
TZAT-0018SLOWVT: NEGATIVE
TZON-0003FASTVT: 300 ms
TZST-0001FASTVT: 5
TZST-0001FASTVT: 6
TZST-0001SLOWVT: 3
TZST-0001SLOWVT: 5
TZST-0002SLOWVT: NEGATIVE
TZST-0002SLOWVT: NEGATIVE
TZST-0002SLOWVT: NEGATIVE
TZST-0003FASTVT: 26 J
TZST-0003FASTVT: 41 J
TZST-0003FASTVT: 41 J
VENTRICULAR PACING ICD: 1 pct

## 2012-01-31 LAB — BASIC METABOLIC PANEL
BUN: 28 mg/dL — ABNORMAL HIGH (ref 6–23)
Calcium: 9 mg/dL (ref 8.4–10.5)
GFR: 40.76 mL/min — ABNORMAL LOW (ref >60.00)
Glucose, Bld: 101 mg/dL — ABNORMAL HIGH (ref 70–99)
Potassium: 4.2 mEq/L (ref 3.5–5.1)

## 2012-01-31 LAB — MAGNESIUM: Magnesium: 2.3 mg/dL (ref 1.5–2.5)

## 2012-01-31 LAB — LIPID PANEL
Cholesterol: 137 mg/dL (ref 0–200)
HDL: 33.7 mg/dL — ABNORMAL LOW (ref >39.00)
Total CHOL/HDL Ratio: 4
VLDL: 45 mg/dL — ABNORMAL HIGH (ref 0.0–40.0)

## 2012-01-31 NOTE — Assessment & Plan Note (Signed)
euvolemic 

## 2012-01-31 NOTE — Assessment & Plan Note (Signed)
Stable

## 2012-01-31 NOTE — Assessment & Plan Note (Signed)
The patient's device was interrogated.  The information was reviewed. No changes were made in the programming.    

## 2012-01-31 NOTE — Assessment & Plan Note (Signed)
Stable with no signs of decompensation. 

## 2012-01-31 NOTE — Assessment & Plan Note (Addendum)
Continue current meds  We will check his lipid status.

## 2012-01-31 NOTE — Patient Instructions (Signed)
Your physician wants you to follow-up in: 12 months with Dr. Graciela Husbands. You will receive a reminder letter in the mail two months in advance. If you don't receive a letter, please call our office to schedule the follow-up appointment.  Remote monitoring is used to monitor your Pacemaker of ICD from home. This monitoring reduces the number of office visits required to check your device to one time per year. It allows [place] to keep an eye on the functioning of your device to ensure it is working properly. You are scheduled for a device check from home on 05/10/2012. You may send your transmission at any time that day. If you have a wireless device, the transmission will be sent automatically. After your physician reviews your transmission, you will receive a postcard with your next transmission date.  LABS TODAY:  Lipids, Direct LDL, BMET, MAG

## 2012-01-31 NOTE — Progress Notes (Signed)
Patient Care Team: Jacques Navy, MD as PCP - General   HPI  [name] is a 78 y.o. male Seen in followup for ischemic cardiomyopathy And status post CABG in 1991. Catheterization 2005 demonstrated patent vein grafts. Echo cardiogram 2010 demonstrated stable left ventricular function at 25%.    The patient denies chest pain, shortness of breath, nocturnal dyspnea, orthopnea or peripheral edema. There have been no palpitations, lightheadedness or syncope.      6  Past Medical History  Diagnosis Date  . Atrial fibrillation     AFIB  . Ischemic cardiomyopathy     CABG in 1991; last catheter 2005 with patent vein graft to PD, diagonal, marginal/echo 2010 EF 25%.  . implantable cardiac defibrillator -single chamber[V45.02]     Environmental manager  . Abdominal aneurysm without mention of rupture   . Chronic systolic heart failure     SYSTOLIC .Marland KitchenACUTE ON CHRONIC  . Hypertension   . Peripheral vascular disease, unspecified   . Hemoptysis   . Acute prostatitis   . Labyrinthitis, unspecified   . Unspecified fall   . Unspecified hearing loss   . Cervicalgia   . Personal history of other diseases of digestive system   . Stroke   . Other primary cardiomyopathies     Past Surgical History  Procedure Date  . Insert / replace / remove pacemaker 2006    AICD GUIDANT VITALITY..IN SITU  . Colectomy 07/15/96  . Colostomy 07/15/96    COLOSTOMY TAKEN DOWN 01/14/97  . Coronary artery bypass graft 03/22/89  . Cardiac catheterization 2001    ANGIOPLASTY W/STENT RCA  . Abdominal aortic aneurysm repair 05/1999    STENT, ENDOVASCULAR RELINING OF GRAFT 3/11  . Cardioversion     Current Outpatient Prescriptions  Medication Sig Dispense Refill  . acetaminophen (TYLENOL) 500 MG tablet Take 500 mg by mouth 2 (two) times daily.       . benazepril (LOTENSIN) 20 MG tablet TAKE 1 TABLET BY MOUTH ONCE DAILY  90 tablet  0  . Calcium Carbonate-Vit D-Min 600-400 MG-UNIT TABS Take 1 tablet by mouth every  evening.       . diclofenac sodium (VOLTAREN) 1 % GEL Apply 1 application topically daily as needed. APPLY 4 GRAMS TO AFFECTED AREA TWO TIMES A DAY. For pain  3 Tube  11  . fish oil-omega-3 fatty acids 1000 MG capsule Take 1 g by mouth daily.        . Flaxseed, Linseed, (FLAXSEED OIL) 1200 MG CAPS Take 1 capsule by mouth daily.        . furosemide (LASIX) 40 MG tablet Take 40 mg by mouth 4 (four) times a week. Monday,tuesday,thursday, and saturday      . Magnesium 250 MG TABS Take 1 tablet by mouth every evening.       . Multiple Vitamin (MULTIVITAMIN) tablet Take 1 tablet by mouth daily.       . nitroGLYCERIN (NITROSTAT) 0.4 MG SL tablet Place 0.4 mg under the tongue every 5 (five) minutes as needed. For chest pain      . simvastatin (ZOCOR) 40 MG tablet TAKE 1/2 TABLET EVERY DAY  45 tablet  0  . TIKOSYN 125 MCG capsule TAKE ONE CAPSULE BY MOUTH TWICE A DAY  180 capsule  3  . warfarin (COUMADIN) 5 MG tablet USE AS DIRECTED  30 tablet  3    Allergies  Allergen Reactions  . Promethazine Hcl     REACTION: world spins.Marland Kitchen  Review of Systems negative except from HPI and PMH  Physical Exam BP 114/68  Pulse 62  Ht 6\' 2"  (1.88 m)  Wt 191 lb (86.637 kg)  BMI 24.52 kg/m2 Well developed and well nourished in no acute distress HENT normal E scleral and icterus clear Neck Supple JVP flat; carotids brisk and full Clear to ausculation  Regular rate and rhythm, no murmurs gallops or rub Soft with active bowel sounds No clubbing cyanosis none Edema Alert and oriented, grossly normal motor and sensory function Skin Warm and Dry    Assessment and  Plan

## 2012-01-31 NOTE — Assessment & Plan Note (Signed)
No significant intercurrent atrial fibrillation. We will check his Tikosyn surveillance lab

## 2012-01-31 NOTE — Assessment & Plan Note (Signed)
For lab today - recommendations to follow

## 2012-02-21 ENCOUNTER — Ambulatory Visit (INDEPENDENT_AMBULATORY_CARE_PROVIDER_SITE_OTHER): Payer: Medicare Other

## 2012-02-21 DIAGNOSIS — Z8679 Personal history of other diseases of the circulatory system: Secondary | ICD-10-CM

## 2012-02-21 DIAGNOSIS — Z7901 Long term (current) use of anticoagulants: Secondary | ICD-10-CM

## 2012-02-21 DIAGNOSIS — I4891 Unspecified atrial fibrillation: Secondary | ICD-10-CM

## 2012-02-28 ENCOUNTER — Encounter: Payer: Self-pay | Admitting: Internal Medicine

## 2012-02-28 ENCOUNTER — Ambulatory Visit (INDEPENDENT_AMBULATORY_CARE_PROVIDER_SITE_OTHER): Payer: Medicare Other | Admitting: Internal Medicine

## 2012-02-28 VITALS — BP 106/58 | HR 65 | Temp 97.1°F | Resp 12 | Ht 73.0 in | Wt 191.6 lb

## 2012-02-28 DIAGNOSIS — Z8679 Personal history of other diseases of the circulatory system: Secondary | ICD-10-CM

## 2012-02-28 DIAGNOSIS — I714 Abdominal aortic aneurysm, without rupture: Secondary | ICD-10-CM

## 2012-02-28 DIAGNOSIS — M542 Cervicalgia: Secondary | ICD-10-CM

## 2012-02-28 DIAGNOSIS — Z0000 Encounter for general adult medical examination without abnormal findings: Secondary | ICD-10-CM

## 2012-02-28 DIAGNOSIS — H9190 Unspecified hearing loss, unspecified ear: Secondary | ICD-10-CM

## 2012-02-28 DIAGNOSIS — I5022 Chronic systolic (congestive) heart failure: Secondary | ICD-10-CM

## 2012-02-28 DIAGNOSIS — I4891 Unspecified atrial fibrillation: Secondary | ICD-10-CM

## 2012-02-28 DIAGNOSIS — Z95810 Presence of automatic (implantable) cardiac defibrillator: Secondary | ICD-10-CM

## 2012-02-28 DIAGNOSIS — I2510 Atherosclerotic heart disease of native coronary artery without angina pectoris: Secondary | ICD-10-CM

## 2012-02-28 DIAGNOSIS — E785 Hyperlipidemia, unspecified: Secondary | ICD-10-CM

## 2012-02-28 NOTE — Patient Instructions (Addendum)
Thanks for coming to see me. Your exam is normal.  Tikosyn does not interact with alcohol but alcohol can trigger A. Fib, can increase risk of vertigo an dizziness.  Advanced Care Planning: as we discussed and you will see in the report I send you. You may also want to go to [email or web address] for an enlightened discussion of these issues.

## 2012-02-28 NOTE — Progress Notes (Signed)
Subjective:    Patient ID: [name], male    DOB: 10/28/1926, 79 y.o.   MRN: [id number]  HPI The patient is here for annual Medicare wellness examination and management of other chronic and acute problems.  He reports that sitting too long he will get a numb leg that will give away; with UE if he rests his arm on a chair he will have numbness.   He has a small raised skin lesion on the right forehead which he will occasionally snag.  He does have erectile dysfunction - in the past he has had good results with viagra in the past when he could take it.    The risk factors are reflected in the social history.  The roster of all physicians providing medical care to patient - is listed in the Snapshot section of the chart.  Activities of daily living:  The patient is 100% inedpendent in all ADLs: dressing, toileting, feeding as well as independent mobility  Home safety : The patient has smoke detectors in the home. Falls - no falls in the last year. Home is fall safe. They wear seatbelts.  firearms are present in the home, kept in a safe fashion. There is no violence in the home.   There is no risks for hepatitis, STDs or HIV. There is no   history of blood transfusion. They have no travel history to infectious disease endemic areas of the world.  The patient has seen their dentist in the last six month. They have seen their eye doctor in the last year. They admit to hearing difficulty and have not had audiologic testing in the last year.    They do not  have excessive sun exposure. Discussed the need for sun protection: hats, long sleeves and use of sunscreen if there is significant sun exposure.   Diet: the importance of a healthy diet is discussed. They do have a healthy diet.  The patient has a regular exercise program: Aqua arthritis,  60 min duration, 3 per week.  The benefits of regular aerobic exercise were discussed.  Depression screen: there are no signs or vegative symptoms of  depression- irritability, change in appetite, anhedonia, sadness/tearfullness.  Cognitive assessment: the patient manages all their financial and personal affairs and is actively engaged.   The following portions of the patient's history were reviewed and updated as appropriate: allergies, current medications, past family history, past medical history,  past surgical history, past social history  and problem list.  Past Medical History  Diagnosis Date  . Atrial fibrillation     AFIB  . Ischemic cardiomyopathy     CABG in 1991; last catheter 2005 with patent vein graft to PD, diagonal, marginal/echo 2010 EF 25%.  . implantable cardiac defibrillator -single chamber[V45.02]     Environmental manager  . Abdominal aneurysm without mention of rupture   . Chronic systolic heart failure     SYSTOLIC .Marland KitchenACUTE ON CHRONIC  . Hypertension   . Peripheral vascular disease, unspecified   . Hemoptysis   . Acute prostatitis   . Labyrinthitis, unspecified   . Unspecified fall   . Unspecified hearing loss   . Cervicalgia   . Personal history of other diseases of digestive system   . Stroke   . Other primary cardiomyopathies    Past Surgical History  Procedure Date  . Insert / replace / remove pacemaker 2006    AICD GUIDANT VITALITY..IN SITU  . Colectomy 07/15/96  . Colostomy 07/15/96  COLOSTOMY TAKEN DOWN 01/14/97  . Coronary artery bypass graft 03/22/89  . Cardiac catheterization 2001    ANGIOPLASTY W/STENT RCA  . Abdominal aortic aneurysm repair 05/1999    STENT, ENDOVASCULAR RELINING OF GRAFT 3/11  . Cardioversion    Family History  Problem Relation Age of Onset  . Stroke Mother   . Heart attack Father     X 6  . Heart disease Father   . Arthritis Sister     RHEUMATOID  . Heart disease Brother    History   Social History  . Marital Status: Married    Spouse Name: N/A    Number of Children: 3  . Years of Education: N/A   Occupational History  . CHEMICAL ENGINEERING     [employer]     Social History Main Topics  . Smoking status: Former Smoker -- 22 years    Quit date: 02/15/1971  . Smokeless tobacco: Not on file  . Alcohol Use: No  . Drug Use: No  . Sexually Active: Not on file   Other Topics Concern  . 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 acetaminophen (TYLENOL) 500 MG tablet Take 500 mg by mouth 2 (two) times daily.       . benazepril (LOTENSIN) 20 MG tablet TAKE 1 TABLET BY MOUTH ONCE DAILY  90 tablet  0  . Calcium Carbonate-Vit D-Min 600-400 MG-UNIT TABS Take 1 tablet by mouth every evening.       . diclofenac sodium (VOLTAREN) 1 % GEL Apply 1 application topically daily as needed. APPLY 4 GRAMS TO AFFECTED AREA TWO TIMES A DAY. For pain  3 Tube  11  . fish oil-omega-3 fatty acids 1000 MG capsule Take 1 g by mouth daily.        . Flaxseed, Linseed, (FLAXSEED OIL) 1200 MG CAPS Take 1 capsule by mouth daily.        . furosemide (LASIX) 40 MG tablet Take 40 mg by mouth 4 (four) times a week. Monday,tuesday,thursday, and saturday      . Magnesium 250 MG TABS Take 1 tablet by mouth every evening.       . Multiple Vitamin (MULTIVITAMIN) tablet Take 1 tablet by mouth daily.       . nitroGLYCERIN (NITROSTAT) 0.4 MG SL tablet Place 0.4 mg under the tongue every 5 (five) minutes as needed. For chest pain      . simvastatin (ZOCOR) 40 MG tablet TAKE 1/2 TABLET EVERY DAY  45 tablet  0  . TIKOSYN 125 MCG capsule TAKE ONE CAPSULE BY MOUTH TWICE A DAY  180 capsule  3  . warfarin (COUMADIN) 5 MG tablet USE AS DIRECTED  30 tablet  3     Vision, hearing, body mass  index were assessed and reviewed.   During the course of the visit the patient was educated and counseled about appropriate screening and preventive services including : fall prevention , diabetes screening, nutrition counseling, colorectal cancer screening, and recommended immunizations.    Review of Systems Constitutional:  Negative for fever, chills, activity change and unexpected weight change.  HEENT:  Positive  for hearing loss. Negative ear pain, congestion, neck stiffness and postnasal drip. Negative for sore throat or swallowing problems. Negative for dental complaints.   Eyes: Negative for vision loss or change in visual acuity.  Respiratory: Negative for chest tightness and wheezing. Negative for DOE.   Cardiovascular: Negative for chest pain or palpitations. No decreased exercise tolerance Gastrointestinal: No change in bowel habit. No bloating or gas. No reflux or indigestion Genitourinary: Negative for urgency, frequency, flank pain and difficulty urinating.  Musculoskeletal: Negative for myalgias, back pain, arthralgias and gait problem.  Neurological: Negative for dizziness, tremors, weakness and headaches.  Hematological: Negative for adenopathy.  Psychiatric/Behavioral: Negative for behavioral problems and dysphoric mood.       Objective:   Physical Exam Filed Vitals:   02/28/12 1009  BP: 106/58  Pulse: 65  Temp: 97.1 F (36.2 C)  Resp: 12   Wt Readings from Last 3 Encounters:  02/28/12 191 lb 9.6 oz (86.909 kg)  01/31/12 191 lb (86.637 kg)  01/30/12 190 lb 1.9 oz (86.238 kg)   Gen'l: Well nourished well developed white male in no acute distress  HEENT: Head: Normocephalic and atraumatic. Right Ear: hearing aid in place. Left ZOX:WRUEAVW aid in place. Nose: Nose normal. Mouth/Throat: Oropharynx is clear and moist. Dentition - native, in good repair. No buccal or palatal lesions. Posterior pharynx clear. Eyes: Conjunctivae and sclera clear. EOM intact. Pupils are  equal, round, and reactive to light. Right eye exhibits no discharge. Left eye exhibits no discharge. Neck: Normal range of motion. Neck supple. No JVD present. No tracheal deviation present. No thyromegaly present.  Cardiovascular: Normal rate, regular rhythm, no gallop, no friction rub, no murmur heard.      Quiet precordium. 2+ radial and DP pulses . No carotid bruits Pulmonary/Chest: Effort normal. No respiratory distress or increased WOB, no wheezes, no rales. No chest wall deformity or CVAT. Abdomen: Soft. Bowel sounds are normal in all quadrants. He exhibits no distension, no tenderness, no rebound or guarding, No heptosplenomegaly  Genitourinary:  deferred Musculoskeletal: Normal range of motion. He exhibits no edema and no tenderness.       Small and large joints without redness, synovial thickening or deformity. Full range of motion preserved about all small, median and large joints.  Lymphadenopathy:    He has no cervical or supraclavicular adenopathy.  Neurological: He is alert and oriented to person, place, and time. CN II-XII intact. DTRs 2+ and symmetrical biceps, radial and patellar tendons. Cerebellar function normal with no tremor, rigidity, normal gait and station.  Skin: Skin is warm and dry. No rash noted. No erythema.  Psychiatric: He has a normal mood and affect. His behavior is normal. Thought content normal.   Lab Results  Component Value Date   WBC 5.6 01/13/2011   HGB 14.2 01/13/2011   HCT 41.3 01/13/2011   PLT 94.0* 01/13/2011   GLUCOSE 101* 01/31/2012   CHOL 137 01/31/2012   TRIG 225.0* 01/31/2012   HDL 33.70* 01/31/2012   LDLDIRECT 80.7 01/31/2012   LDLCALC 86 09/02/2009   ALT 16 09/02/2009   AST 21 09/02/2009   NA 136 01/31/2012   K 4.2 01/31/2012   CL 101 01/31/2012   CREATININE 1.7* 01/31/2012   BUN 28* 01/31/2012   CO2 28 01/31/2012   TSH 0.62 10/24/2008   PSA 3.29 06/15/2007   INR 2.4 02/21/2012   HGBA1C  Value: 5.5  03/12/2007            Assessment & Plan:

## 2012-02-29 ENCOUNTER — Encounter: Payer: Self-pay | Admitting: Internal Medicine

## 2012-03-01 DIAGNOSIS — Z0000 Encounter for general adult medical examination without abnormal findings: Secondary | ICD-10-CM | POA: Insufficient documentation

## 2012-03-01 NOTE — Assessment & Plan Note (Signed)
Stable with no c/o chest pain. Remains very active and functional. Follows closely with cardiology

## 2012-03-01 NOTE — Assessment & Plan Note (Signed)
Patient has a long h/o A. Fib s/p ablation and DCC on several occasions. Continues on Tikosyn. Close follow up with Dr. Graciela Husbands. He is holding sinus rhythm. Research interaction Tikosyn with EtOH: no direct interaction but due to potential for dizziness/dysequilibrium with each drug EtOH abstinence is advised. Also, EtOH is a potential trigger for a. Fib.

## 2012-03-01 NOTE — Assessment & Plan Note (Signed)
Interval history - he has been stable with no major illness or hospitalization this year. Physical exam,san prostate, is unremarkable. Labs reviewed - normal range with no indication for repeat labs at this time. He has aged out of screening for colorectal or prostate cancer. Immunizations are current except he is due for pneumonia vaccine. ACP discussed.  In summary - a very nice man with a complex medical history who does appear stable at this time. He will return as needed or in 6 months for an interim check-up.

## 2012-03-01 NOTE — Assessment & Plan Note (Signed)
Stable and doing well. Follows with Dr. Pattricia Boss approximately every 2 years

## 2012-03-01 NOTE — Assessment & Plan Note (Signed)
Follows closely in Device [facility]. Doing well.

## 2012-03-01 NOTE — Assessment & Plan Note (Signed)
Bilateral amplification - does OK

## 2012-03-01 NOTE — Assessment & Plan Note (Signed)
Doing well. No deficits. Active risk modification. Fully anticoagulated.

## 2012-03-01 NOTE — Assessment & Plan Note (Signed)
Estimated EF 25%. Patient is medically managed and remains highly functional and active.

## 2012-03-01 NOTE — Assessment & Plan Note (Signed)
No complaint of neck pain at today's visit. No limitation in activity reported at this time

## 2012-03-01 NOTE — Assessment & Plan Note (Signed)
Dec '13 - LDL at goal of 80 or less. Liver functions were normal.  Plan Continue present regimen

## 2012-03-14 ENCOUNTER — Other Ambulatory Visit: Payer: Self-pay | Admitting: Internal Medicine

## 2012-03-29 ENCOUNTER — Other Ambulatory Visit: Payer: Self-pay | Admitting: Internal Medicine

## 2012-04-03 ENCOUNTER — Ambulatory Visit (INDEPENDENT_AMBULATORY_CARE_PROVIDER_SITE_OTHER): Payer: Medicare Other | Admitting: *Deleted

## 2012-04-03 DIAGNOSIS — Z7901 Long term (current) use of anticoagulants: Secondary | ICD-10-CM

## 2012-04-03 DIAGNOSIS — Z8679 Personal history of other diseases of the circulatory system: Secondary | ICD-10-CM

## 2012-04-03 DIAGNOSIS — I4891 Unspecified atrial fibrillation: Secondary | ICD-10-CM

## 2012-04-03 LAB — POCT INR: INR: 2.5

## 2012-04-10 ENCOUNTER — Other Ambulatory Visit: Payer: Self-pay | Admitting: Internal Medicine

## 2012-05-10 ENCOUNTER — Other Ambulatory Visit: Payer: Self-pay

## 2012-05-10 ENCOUNTER — Ambulatory Visit (INDEPENDENT_AMBULATORY_CARE_PROVIDER_SITE_OTHER): Payer: Medicare Other | Admitting: *Deleted

## 2012-05-10 DIAGNOSIS — I2589 Other forms of chronic ischemic heart disease: Secondary | ICD-10-CM

## 2012-05-10 DIAGNOSIS — Z95810 Presence of automatic (implantable) cardiac defibrillator: Secondary | ICD-10-CM

## 2012-05-10 DIAGNOSIS — I255 Ischemic cardiomyopathy: Secondary | ICD-10-CM

## 2012-05-15 ENCOUNTER — Ambulatory Visit (INDEPENDENT_AMBULATORY_CARE_PROVIDER_SITE_OTHER): Payer: Medicare Other | Admitting: *Deleted

## 2012-05-15 DIAGNOSIS — Z7901 Long term (current) use of anticoagulants: Secondary | ICD-10-CM

## 2012-05-15 DIAGNOSIS — Z8679 Personal history of other diseases of the circulatory system: Secondary | ICD-10-CM

## 2012-05-15 DIAGNOSIS — I4891 Unspecified atrial fibrillation: Secondary | ICD-10-CM

## 2012-05-15 LAB — POCT INR: INR: 2.3

## 2012-05-31 LAB — REMOTE ICD DEVICE
BRDY-0002RV: 40 {beats}/min
CHARGE TIME: 9.5 s
FVT: 0
RV LEAD AMPLITUDE: 6.3 mv
RV LEAD IMPEDENCE ICD: 506 Ohm
TOT-0006: 20131217000000
TZAT-0001FASTVT: 2
TZAT-0001SLOWVT: 2
TZAT-0013FASTVT: 2
TZAT-0018FASTVT: NEGATIVE
TZAT-0018SLOWVT: NEGATIVE
TZON-0003SLOWVT: 375 ms
TZST-0001FASTVT: 3
TZST-0001FASTVT: 4
TZST-0001FASTVT: 7
TZST-0001FASTVT: 8
TZST-0001SLOWVT: 4
TZST-0001SLOWVT: 6
TZST-0001SLOWVT: 7
TZST-0002SLOWVT: NEGATIVE
TZST-0002SLOWVT: NEGATIVE
TZST-0003FASTVT: 26 J
TZST-0003FASTVT: 41 J
TZST-0003FASTVT: 41 J
TZST-0003FASTVT: 41 J
VF: 0

## 2012-06-27 ENCOUNTER — Ambulatory Visit (INDEPENDENT_AMBULATORY_CARE_PROVIDER_SITE_OTHER): Payer: Medicare Other | Admitting: *Deleted

## 2012-06-27 DIAGNOSIS — Z8679 Personal history of other diseases of the circulatory system: Secondary | ICD-10-CM

## 2012-06-27 DIAGNOSIS — I4891 Unspecified atrial fibrillation: Secondary | ICD-10-CM

## 2012-06-27 DIAGNOSIS — Z7901 Long term (current) use of anticoagulants: Secondary | ICD-10-CM

## 2012-07-18 ENCOUNTER — Encounter: Payer: Self-pay | Admitting: Internal Medicine

## 2012-07-20 ENCOUNTER — Other Ambulatory Visit: Payer: Self-pay | Admitting: Internal Medicine

## 2012-08-07 ENCOUNTER — Ambulatory Visit (INDEPENDENT_AMBULATORY_CARE_PROVIDER_SITE_OTHER): Payer: Medicare Other | Admitting: *Deleted

## 2012-08-07 DIAGNOSIS — I4891 Unspecified atrial fibrillation: Secondary | ICD-10-CM

## 2012-08-07 DIAGNOSIS — Z8679 Personal history of other diseases of the circulatory system: Secondary | ICD-10-CM

## 2012-08-07 DIAGNOSIS — Z7901 Long term (current) use of anticoagulants: Secondary | ICD-10-CM

## 2012-08-09 ENCOUNTER — Ambulatory Visit (INDEPENDENT_AMBULATORY_CARE_PROVIDER_SITE_OTHER): Payer: Medicare Other | Admitting: *Deleted

## 2012-08-09 DIAGNOSIS — I255 Ischemic cardiomyopathy: Secondary | ICD-10-CM

## 2012-08-09 DIAGNOSIS — I2589 Other forms of chronic ischemic heart disease: Secondary | ICD-10-CM

## 2012-08-09 DIAGNOSIS — Z95810 Presence of automatic (implantable) cardiac defibrillator: Secondary | ICD-10-CM

## 2012-08-20 LAB — REMOTE ICD DEVICE
BRDY-0002RV: 40 {beats}/min
CHARGE TIME: 9.6 s
DEV-0020ICD: NEGATIVE
DEVICE MODEL ICD: 119203
FVT: 0
HV IMPEDENCE: 50 Ohm
PACEART VT: 0
RV LEAD AMPLITUDE: 7.4 mv
RV LEAD IMPEDENCE ICD: 532 Ohm
TOT-0006: 20140327000000
TZAT-0001FASTVT: 1
TZAT-0001FASTVT: 2
TZAT-0001SLOWVT: 1
TZAT-0001SLOWVT: 2
TZAT-0002SLOWVT: NEGATIVE
TZAT-0002SLOWVT: NEGATIVE
TZAT-0013FASTVT: 2
TZAT-0013FASTVT: 2
TZAT-0018FASTVT: NEGATIVE
TZAT-0018FASTVT: NEGATIVE
TZAT-0018SLOWVT: NEGATIVE
TZAT-0018SLOWVT: NEGATIVE
TZON-0003FASTVT: 300 ms
TZON-0003SLOWVT: 375 ms
TZST-0001FASTVT: 3
TZST-0001FASTVT: 4
TZST-0001FASTVT: 5
TZST-0001FASTVT: 6
TZST-0001FASTVT: 7
TZST-0001FASTVT: 8
TZST-0001SLOWVT: 3
TZST-0001SLOWVT: 4
TZST-0001SLOWVT: 5
TZST-0001SLOWVT: 6
TZST-0001SLOWVT: 7
TZST-0002SLOWVT: NEGATIVE
TZST-0002SLOWVT: NEGATIVE
TZST-0002SLOWVT: NEGATIVE
TZST-0002SLOWVT: NEGATIVE
TZST-0002SLOWVT: NEGATIVE
TZST-0003FASTVT: 26 J
TZST-0003FASTVT: 41 J
TZST-0003FASTVT: 41 J
TZST-0003FASTVT: 41 J
TZST-0003FASTVT: 41 J
TZST-0003FASTVT: 41 J
VENTRICULAR PACING ICD: 0 pct
VF: 0

## 2012-09-03 ENCOUNTER — Encounter: Payer: Self-pay | Admitting: Internal Medicine

## 2012-09-14 ENCOUNTER — Ambulatory Visit (INDEPENDENT_AMBULATORY_CARE_PROVIDER_SITE_OTHER): Payer: Medicare Other | Admitting: *Deleted

## 2012-09-14 DIAGNOSIS — Z7901 Long term (current) use of anticoagulants: Secondary | ICD-10-CM

## 2012-09-14 DIAGNOSIS — Z8679 Personal history of other diseases of the circulatory system: Secondary | ICD-10-CM

## 2012-09-14 DIAGNOSIS — I4891 Unspecified atrial fibrillation: Secondary | ICD-10-CM

## 2012-09-19 ENCOUNTER — Other Ambulatory Visit: Payer: Self-pay

## 2012-10-04 ENCOUNTER — Other Ambulatory Visit: Payer: Self-pay | Admitting: Internal Medicine

## 2012-10-08 ENCOUNTER — Other Ambulatory Visit: Payer: Self-pay

## 2012-10-08 MED ORDER — DOFETILIDE 125 MCG PO CAPS
ORAL_CAPSULE | ORAL | Status: DC
Start: 2012-10-08 — End: 2013-10-02

## 2012-10-14 ENCOUNTER — Other Ambulatory Visit: Payer: Self-pay | Admitting: Internal Medicine

## 2012-10-23 ENCOUNTER — Ambulatory Visit (INDEPENDENT_AMBULATORY_CARE_PROVIDER_SITE_OTHER): Payer: Medicare Other | Admitting: *Deleted

## 2012-10-23 DIAGNOSIS — Z7901 Long term (current) use of anticoagulants: Secondary | ICD-10-CM

## 2012-10-23 DIAGNOSIS — Z8679 Personal history of other diseases of the circulatory system: Secondary | ICD-10-CM

## 2012-10-23 DIAGNOSIS — I4891 Unspecified atrial fibrillation: Secondary | ICD-10-CM

## 2012-10-23 LAB — POCT INR: INR: 2.4

## 2012-11-07 ENCOUNTER — Ambulatory Visit (INDEPENDENT_AMBULATORY_CARE_PROVIDER_SITE_OTHER): Payer: Medicare Other | Admitting: *Deleted

## 2012-11-07 DIAGNOSIS — Z23 Encounter for immunization: Secondary | ICD-10-CM

## 2012-11-08 ENCOUNTER — Ambulatory Visit (INDEPENDENT_AMBULATORY_CARE_PROVIDER_SITE_OTHER): Payer: Medicare Other | Admitting: *Deleted

## 2012-11-08 DIAGNOSIS — I255 Ischemic cardiomyopathy: Secondary | ICD-10-CM

## 2012-11-08 DIAGNOSIS — I2589 Other forms of chronic ischemic heart disease: Secondary | ICD-10-CM

## 2012-11-08 DIAGNOSIS — I4891 Unspecified atrial fibrillation: Secondary | ICD-10-CM

## 2012-11-12 LAB — REMOTE ICD DEVICE
CHARGE TIME: 9.6 s
DEV-0020ICD: NEGATIVE
HV IMPEDENCE: 49 Ohm
RV LEAD AMPLITUDE: 20.8 mv
RV LEAD IMPEDENCE ICD: 511 Ohm
TZAT-0001FASTVT: 1
TZAT-0001SLOWVT: 1
TZAT-0002SLOWVT: NEGATIVE
TZAT-0002SLOWVT: NEGATIVE
TZAT-0013FASTVT: 2
TZAT-0013FASTVT: 2
TZAT-0018FASTVT: NEGATIVE
TZAT-0018SLOWVT: NEGATIVE
TZAT-0018SLOWVT: NEGATIVE
TZON-0003FASTVT: 300 ms
TZON-0003SLOWVT: 375 ms
TZST-0001FASTVT: 3
TZST-0001FASTVT: 4
TZST-0001FASTVT: 5
TZST-0001FASTVT: 6
TZST-0001SLOWVT: 3
TZST-0001SLOWVT: 5
TZST-0001SLOWVT: 6
TZST-0001SLOWVT: 7
TZST-0002SLOWVT: NEGATIVE
TZST-0002SLOWVT: NEGATIVE
TZST-0002SLOWVT: NEGATIVE
TZST-0002SLOWVT: NEGATIVE
TZST-0003FASTVT: 26 J
TZST-0003FASTVT: 41 J
TZST-0003FASTVT: 41 J
TZST-0003FASTVT: 41 J
VENTRICULAR PACING ICD: 0 pct

## 2012-11-29 ENCOUNTER — Other Ambulatory Visit: Payer: Self-pay | Admitting: Internal Medicine

## 2012-12-04 ENCOUNTER — Ambulatory Visit (INDEPENDENT_AMBULATORY_CARE_PROVIDER_SITE_OTHER): Payer: Medicare Other | Admitting: *Deleted

## 2012-12-04 DIAGNOSIS — Z8679 Personal history of other diseases of the circulatory system: Secondary | ICD-10-CM

## 2012-12-04 DIAGNOSIS — Z7901 Long term (current) use of anticoagulants: Secondary | ICD-10-CM

## 2012-12-04 DIAGNOSIS — I4891 Unspecified atrial fibrillation: Secondary | ICD-10-CM

## 2012-12-06 ENCOUNTER — Encounter: Payer: Self-pay | Admitting: Internal Medicine

## 2012-12-21 ENCOUNTER — Encounter: Payer: Self-pay | Admitting: Internal Medicine

## 2012-12-28 ENCOUNTER — Other Ambulatory Visit: Payer: Self-pay | Admitting: *Deleted

## 2012-12-28 MED ORDER — WARFARIN SODIUM 5 MG PO TABS: ORAL_TABLET | ORAL | Status: DC

## 2013-01-15 ENCOUNTER — Ambulatory Visit (INDEPENDENT_AMBULATORY_CARE_PROVIDER_SITE_OTHER): Payer: Medicare Other | Admitting: General Practice

## 2013-01-15 DIAGNOSIS — Z8679 Personal history of other diseases of the circulatory system: Secondary | ICD-10-CM

## 2013-01-15 DIAGNOSIS — I4891 Unspecified atrial fibrillation: Secondary | ICD-10-CM

## 2013-01-15 DIAGNOSIS — Z7901 Long term (current) use of anticoagulants: Secondary | ICD-10-CM

## 2013-02-21 ENCOUNTER — Encounter: Payer: Self-pay | Admitting: *Deleted

## 2013-02-26 ENCOUNTER — Ambulatory Visit (INDEPENDENT_AMBULATORY_CARE_PROVIDER_SITE_OTHER): Payer: Medicare Other | Admitting: Pharmacist

## 2013-02-26 ENCOUNTER — Ambulatory Visit (INDEPENDENT_AMBULATORY_CARE_PROVIDER_SITE_OTHER): Payer: Medicare Other | Admitting: Internal Medicine

## 2013-02-26 ENCOUNTER — Encounter: Payer: Self-pay | Admitting: Internal Medicine

## 2013-02-26 VITALS — BP 134/69 | HR 63 | Ht 74.0 in | Wt 191.0 lb

## 2013-02-26 DIAGNOSIS — Z95810 Presence of automatic (implantable) cardiac defibrillator: Secondary | ICD-10-CM

## 2013-02-26 DIAGNOSIS — I4891 Unspecified atrial fibrillation: Secondary | ICD-10-CM

## 2013-02-26 DIAGNOSIS — I2589 Other forms of chronic ischemic heart disease: Secondary | ICD-10-CM

## 2013-02-26 DIAGNOSIS — I255 Ischemic cardiomyopathy: Secondary | ICD-10-CM

## 2013-02-26 DIAGNOSIS — Z7901 Long term (current) use of anticoagulants: Secondary | ICD-10-CM

## 2013-02-26 DIAGNOSIS — I5022 Chronic systolic (congestive) heart failure: Secondary | ICD-10-CM

## 2013-02-26 DIAGNOSIS — Z8679 Personal history of other diseases of the circulatory system: Secondary | ICD-10-CM

## 2013-02-26 LAB — MDC_IDC_ENUM_SESS_TYPE_INCLINIC
Battery Remaining Longevity: 144 mo
Brady Statistic RV Percent Paced: 1 %
Date Time Interrogation Session: 20150113050000
HighPow Impedance: 29 Ohm
HighPow Impedance: 52 Ohm
Implantable Pulse Generator Serial Number: 119203
Lead Channel Setting Pacing Amplitude: 2.4 V
MDC IDC MSMT LEADCHNL RV IMPEDANCE VALUE: 511 Ohm
MDC IDC MSMT LEADCHNL RV PACING THRESHOLD AMPLITUDE: 0.7 V
MDC IDC MSMT LEADCHNL RV PACING THRESHOLD PULSEWIDTH: 0.4 ms
MDC IDC MSMT LEADCHNL RV SENSING INTR AMPL: 6.5 mV
MDC IDC SET LEADCHNL RV PACING PULSEWIDTH: 0.4 ms
MDC IDC SET LEADCHNL RV SENSING SENSITIVITY: 0.4 mV
MDC IDC SET ZONE DETECTION INTERVAL: 250 ms
MDC IDC SET ZONE DETECTION INTERVAL: 375 ms
Zone Setting Detection Interval: 300 ms

## 2013-02-26 LAB — BASIC METABOLIC PANEL
BUN: 25 mg/dL — ABNORMAL HIGH (ref 6–23)
CHLORIDE: 102 meq/L (ref 96–112)
CO2: 31 meq/L (ref 19–32)
Calcium: 9.2 mg/dL (ref 8.4–10.5)
Creatinine, Ser: 1.7 mg/dL — ABNORMAL HIGH (ref 0.4–1.5)
GFR: 42.07 mL/min — ABNORMAL LOW (ref >60.00)
GLUCOSE: 94 mg/dL (ref 70–99)
POTASSIUM: 4.4 meq/L (ref 3.5–5.1)
SODIUM: 138 meq/L (ref 135–145)

## 2013-02-26 LAB — POCT INR: INR: 2.1

## 2013-02-26 LAB — MAGNESIUM: Magnesium: 2.3 mg/dL (ref 1.5–2.5)

## 2013-02-26 NOTE — Assessment & Plan Note (Signed)
Continue current medications-no beta blockers

## 2013-02-26 NOTE — Patient Instructions (Addendum)
Your physician recommends that you return for lab work today: BMET/Mg  Your physician recommends that you continue on your current medications as directed. Please refer to the Current Medication list given to you today.  Remote monitoring is used to monitor your Pacemaker of ICD from home. This monitoring reduces the number of office visits required to check your device to one time per year. It allows us to keep an eye on the functioning of your device to ensure it is working properly. You are scheduled for a device check from home on 05/30/2013. You may send your transmission at any time that day. If you have a wireless device, the transmission will be sent automatically. After your physician reviews your transmission, you will receive a postcard with your next transmission date.  Your physician wants you to follow-up in: 6 months with Rick DuffBrooke Edmisten, PAC.  You will receive a reminder letter in the mail two months in advance. If you don't receive a letter, please call our office to schedule the follow-up appointment.

## 2013-02-26 NOTE — Progress Notes (Signed)
Patient Care Team: Jacques Navy, MD as PCP - General Duke Salvia, MD (Cardiology) Lamar Benes, MD (Ophthalmology) Suszanne Finch (Vascular Surgery)   HPI  [name] is a 80 y.o. male Seen in followup for ischemic cardiomyopathy And status post CABG in 1991  he is status post ICD implantation .   Catheterization 2005 demonstrated patent vein grafts. Echo cardiogram 2010 demonstrated stable left ventricular function at 25%.  The patient denies chest pain, shortness of breath, nocturnal dyspnea, orthopnea or peripheral edema. There have been no palpitations, lightheadedness or syncope.   He had his appointments next month for followup restenting of a leaking stent graft  Past Medical History  Diagnosis Date  . Atrial fibrillation     AFIB  . Ischemic cardiomyopathy     CABG in 1991; last catheter 2005 with patent vein graft to PD, diagonal, marginal/echo 2010 EF 25%.  . implantable cardiac defibrillator -single chamber[V45.02]     Environmental manager  . Abdominal aneurysm without mention of rupture     s/p stent graft with endoleak requiring restenting-[facility]  . Chronic systolic heart failure     SYSTOLIC .Marland KitchenACUTE ON CHRONIC  . Hypertension   . Peripheral vascular disease, unspecified   . Hemoptysis   . Acute prostatitis   . Labyrinthitis, unspecified   . Unspecified hearing loss   . Cervicalgia   . Stroke     Past Surgical History  Procedure Laterality Date  . Insert / replace / remove pacemaker  2006    AICD GUIDANT VITALITY..IN SITU  . Colectomy  07/15/96  . Colostomy  07/15/96    COLOSTOMY TAKEN DOWN 01/14/97  . Coronary artery bypass graft  03/22/89  . Cardiac catheterization  2001    ANGIOPLASTY W/STENT RCA  . Abdominal aortic aneurysm repair  05/1999    STENT, ENDOVASCULAR RELINING OF GRAFT 3/11  . Cardioversion      Current Outpatient Prescriptions  Medication Sig Dispense Refill  . acetaminophen (TYLENOL) 500 MG tablet Take 500 mg by mouth 2 (two)  times daily.       . benazepril (LOTENSIN) 20 MG tablet TAKE 1 TABLET BY MOUTH EVERY DAY  90 tablet  3  . Calcium Carbonate-Vit D-Min 600-400 MG-UNIT TABS Take 1 tablet by mouth every evening.       . diclofenac sodium (VOLTAREN) 1 % GEL Apply 1 application topically daily as needed. APPLY 4 GRAMS TO AFFECTED AREA TWO TIMES A DAY. For pain  3 Tube  11  . dofetilide (TIKOSYN) 125 MCG capsule TAKE ONE CAPSULE BY MOUTH TWICE A DAY  180 capsule  3  . fish oil-omega-3 fatty acids 1000 MG capsule Take 1 g by mouth daily.        . Flaxseed, Linseed, (FLAXSEED OIL) 1200 MG CAPS Take 1 capsule by mouth daily.        . furosemide (LASIX) 40 MG tablet Take 40 mg by mouth 4 (four) times a week. Monday,tuesday,thursday, and saturday      . Magnesium 250 MG TABS Take 1 tablet by mouth every evening.       . Multiple Vitamin (MULTIVITAMIN) tablet Take 1 tablet by mouth daily.       . nitroGLYCERIN (NITROSTAT) 0.4 MG SL tablet Place 0.4 mg under the tongue every 5 (five) minutes as needed. For chest pain      . simvastatin (ZOCOR) 40 MG tablet TAKE 1/2 TABLET EVERY DAY  45 tablet  6  .  warfarin (COUMADIN) 5 MG tablet Take as directed by coumadin clinic  90 tablet  1   No current facility-administered medications for this visit.    Allergies  Allergen Reactions  . Promethazine Hcl     REACTION: world spins..    Review of Systems negative except from HPI and PMH  Physical Exam BP 134/69  Pulse 63  Ht 6\' 2"  (1.88 m)  Wt 191 lb (86.637 kg)  BMI 24.51 kg/m2 Well developed and well nourished in no acute distress HENT normal E scleral and icterus clear Neck Supple JVP flat; carotids brisk and full Clear to ausculation  Regular rate and rhythm, no murmurs gallops or rub Soft with active bowel sounds No clubbing cyanosis Trace Edema Alert and oriented, grossly normal motor and sensory function Skin Warm and Dry    Assessment and  Plan

## 2013-02-26 NOTE — Assessment & Plan Note (Signed)
No intercurrent atrial fibrillation. We'll continue dofetilide and check potassium and magnesium levels

## 2013-02-26 NOTE — Assessment & Plan Note (Addendum)
A small amount of edema; no problem we'll not adjust medications

## 2013-02-26 NOTE — Assessment & Plan Note (Signed)
The patient's device was interrogated.  The information was reviewed. No changes were made in the programming.    

## 2013-04-09 ENCOUNTER — Ambulatory Visit (INDEPENDENT_AMBULATORY_CARE_PROVIDER_SITE_OTHER): Payer: Medicare Other | Admitting: *Deleted

## 2013-04-09 DIAGNOSIS — Z8679 Personal history of other diseases of the circulatory system: Secondary | ICD-10-CM

## 2013-04-09 DIAGNOSIS — I4891 Unspecified atrial fibrillation: Secondary | ICD-10-CM

## 2013-04-09 DIAGNOSIS — Z7901 Long term (current) use of anticoagulants: Secondary | ICD-10-CM

## 2013-04-09 LAB — POCT INR: INR: 2.4

## 2013-04-11 ENCOUNTER — Other Ambulatory Visit: Payer: Self-pay | Admitting: Internal Medicine

## 2013-05-16 ENCOUNTER — Encounter: Payer: Self-pay | Admitting: Internal Medicine

## 2013-05-21 ENCOUNTER — Ambulatory Visit (INDEPENDENT_AMBULATORY_CARE_PROVIDER_SITE_OTHER): Payer: Medicare Other | Admitting: Pharmacist

## 2013-05-21 DIAGNOSIS — Z7901 Long term (current) use of anticoagulants: Secondary | ICD-10-CM

## 2013-05-21 DIAGNOSIS — Z8679 Personal history of other diseases of the circulatory system: Secondary | ICD-10-CM

## 2013-05-21 DIAGNOSIS — I4891 Unspecified atrial fibrillation: Secondary | ICD-10-CM

## 2013-05-21 LAB — POCT INR: INR: 2.1

## 2013-05-30 ENCOUNTER — Encounter: Payer: Medicare Other | Admitting: *Deleted

## 2013-06-03 ENCOUNTER — Ambulatory Visit (INDEPENDENT_AMBULATORY_CARE_PROVIDER_SITE_OTHER): Payer: Medicare Other | Admitting: *Deleted

## 2013-06-03 ENCOUNTER — Other Ambulatory Visit: Payer: Self-pay | Admitting: Internal Medicine

## 2013-06-03 ENCOUNTER — Encounter: Payer: Self-pay | Admitting: Internal Medicine

## 2013-06-03 DIAGNOSIS — I429 Cardiomyopathy, unspecified: Secondary | ICD-10-CM

## 2013-06-03 DIAGNOSIS — I255 Ischemic cardiomyopathy: Secondary | ICD-10-CM

## 2013-06-09 LAB — MDC_IDC_ENUM_SESS_TYPE_REMOTE
Date Time Interrogation Session: 20150420150100
HIGH POWER IMPEDANCE MEASURED VALUE: 46 Ohm
Implantable Pulse Generator Serial Number: 119203
Lead Channel Pacing Threshold Amplitude: 0.7 V
Lead Channel Sensing Intrinsic Amplitude: 5.8 mV
Lead Channel Setting Pacing Amplitude: 2.4 V
MDC IDC MSMT BATTERY REMAINING LONGEVITY: 144 mo
MDC IDC MSMT LEADCHNL RV IMPEDANCE VALUE: 471 Ohm
MDC IDC MSMT LEADCHNL RV PACING THRESHOLD PULSEWIDTH: 0.4 ms
MDC IDC SET LEADCHNL RV PACING PULSEWIDTH: 0.4 ms
MDC IDC SET ZONE DETECTION INTERVAL: 250 ms
MDC IDC SET ZONE DETECTION INTERVAL: 300 ms
MDC IDC SET ZONE DETECTION INTERVAL: 375 ms
MDC IDC STAT BRADY RV PERCENT PACED: 0 %

## 2013-06-12 ENCOUNTER — Encounter: Payer: Self-pay | Admitting: *Deleted

## 2013-06-17 ENCOUNTER — Ambulatory Visit (INDEPENDENT_AMBULATORY_CARE_PROVIDER_SITE_OTHER): Payer: Medicare Other | Admitting: Internal Medicine

## 2013-06-17 ENCOUNTER — Encounter: Payer: Self-pay | Admitting: Internal Medicine

## 2013-06-17 VITALS — BP 130/72 | HR 72 | Temp 97.7°F | Resp 16 | Wt 191.0 lb

## 2013-06-17 DIAGNOSIS — I2510 Atherosclerotic heart disease of native coronary artery without angina pectoris: Secondary | ICD-10-CM

## 2013-06-17 DIAGNOSIS — Z7901 Long term (current) use of anticoagulants: Secondary | ICD-10-CM

## 2013-06-17 DIAGNOSIS — N320 Bladder-neck obstruction: Secondary | ICD-10-CM

## 2013-06-17 DIAGNOSIS — I714 Abdominal aortic aneurysm, without rupture, unspecified: Secondary | ICD-10-CM

## 2013-06-17 DIAGNOSIS — Z95810 Presence of automatic (implantable) cardiac defibrillator: Secondary | ICD-10-CM

## 2013-06-17 DIAGNOSIS — I255 Ischemic cardiomyopathy: Secondary | ICD-10-CM

## 2013-06-17 DIAGNOSIS — I2589 Other forms of chronic ischemic heart disease: Secondary | ICD-10-CM

## 2013-06-17 DIAGNOSIS — I4891 Unspecified atrial fibrillation: Secondary | ICD-10-CM

## 2013-06-17 DIAGNOSIS — H6120 Impacted cerumen, unspecified ear: Secondary | ICD-10-CM

## 2013-06-17 NOTE — Progress Notes (Signed)
Pre visit review using our clinic review tool, if applicable. No additional management support is needed unless otherwise documented below in the visit note. 

## 2013-06-17 NOTE — Assessment & Plan Note (Signed)
Abd CT angio 01/12/09  IMPRESSION:  1. No definite endovascular leak is currently seen.  2. However, the native aneurysm sac has increased slightly in  size, now measuring 56 x 60 mm compared to 55 x 55 mm previously.  3. Incidental gallstones.  CTA PELVIS  Findings: The iliac limbs of the AAA stent opacify with no  significant abnormality noted. Both the internal and external  iliac arteries opacify. The urinary bladder is slightly thick-  walled. The prostate is moderately prominent. Postop changes from  prior left hemicolectomy are again noted.  Review of the MIP images confirms the above findings.  IMPRESSION:  1. Stable iliac limbs of AAA stent.  2. Probable some degree of bladder outlet obstruction with  moderately enlarged prostate.    F/u w/Dr Pattricia BossFarber

## 2013-06-17 NOTE — Assessment & Plan Note (Signed)
Dr Graciela HusbandsKlein  Generator replacement Dec '12

## 2013-06-17 NOTE — Assessment & Plan Note (Signed)
Continue with current prescription therapy as reflected on the Med list.  

## 2013-06-17 NOTE — Progress Notes (Signed)
Subjective:   HPI  New pt - transferring care from Dr Debby BudNorins C/o fatigue  The patient presents for a follow-up of CHF, A fib, pacemaker  chronic hypertension, chronic dyslipidemia controlled with medicines  F/u  [facility] - Dr Bennie PieriniMark Farber - a complex long hx - STENT and STENT repair in 2011    Review of Systems  Constitutional: Negative for appetite change, fatigue and unexpected weight change.  HENT: Positive for hearing loss. Negative for congestion, nosebleeds, sneezing, sore throat and trouble swallowing.   Eyes: Negative for itching and visual disturbance.  Respiratory: Negative for cough.   Cardiovascular: Negative for chest pain, palpitations and leg swelling.  Gastrointestinal: Negative for nausea, diarrhea, blood in stool and abdominal distention.  Genitourinary: Negative for frequency and hematuria.  Musculoskeletal: Negative for back pain, gait problem, joint swelling and neck pain.  Skin: Negative for rash.  Neurological: Negative for dizziness, tremors, speech difficulty and weakness.  Psychiatric/Behavioral: Negative for suicidal ideas, sleep disturbance, dysphoric mood and agitation. The patient is not nervous/anxious.        Objective:   Physical Exam  Constitutional: He is oriented to person, place, and time. He appears well-developed. No distress.  NAD  HENT:  Mouth/Throat: Oropharynx is clear and moist. No oropharyngeal exudate.  Wax B  Eyes: Conjunctivae are normal. Pupils are equal, round, and reactive to light.  Neck: Normal range of motion. No JVD present. No thyromegaly present.  Cardiovascular: Normal rate, normal heart sounds and intact distal pulses.  Exam reveals no gallop and no friction rub.   No murmur heard. Pulmonary/Chest: Effort normal and breath sounds normal. No respiratory distress. He has no wheezes. He has no rales. He exhibits no tenderness.  Abdominal: Soft. Bowel sounds are normal. He exhibits no distension and no mass. There is no  tenderness. There is no rebound and no guarding.  Scars and a umbilical hernia  Musculoskeletal: Normal range of motion. He exhibits no edema and no tenderness.  Lymphadenopathy:    He has no cervical adenopathy.  Neurological: He is alert and oriented to person, place, and time. He has normal reflexes. No cranial nerve deficit. He exhibits normal muscle tone. He displays a negative Romberg sign. Coordination and gait normal.  No meningeal signs  Skin: Skin is warm and dry. No rash noted.  Psychiatric: He has a normal mood and affect. His behavior is normal. Judgment and thought content normal.   Abd CT angio 01/12/09  IMPRESSION:  1. No definite endovascular leak is currently seen.  2. However, the native aneurysm sac has increased slightly in  size, now measuring 56 x 60 mm compared to 55 x 55 mm previously.  3. Incidental gallstones.  CTA PELVIS  Findings: The iliac limbs of the AAA stent opacify with no  significant abnormality noted. Both the internal and external  iliac arteries opacify. The urinary bladder is slightly thick-  walled. The prostate is moderately prominent. Postop changes from  prior left hemicolectomy are again noted.  Review of the MIP images confirms the above findings.  IMPRESSION:  1. Stable iliac limbs of AAA stent.  2. Probable some degree of bladder outlet obstruction with  moderately enlarged prostate.    A complex case   Procedure Note :     Procedure :  Ear irrigation   Indication:  Cerumen impaction B   Risks, including pain, dizziness, eardrum perforation, bleeding, infection and others as well as benefits were explained to the patient in detail. Verbal consent  was obtained and the patient agreed to proceed.    We used "The Elephant Ear Irrigation Device" filled with lukewarm water for irrigation. A large amount wax was recovered. Procedure has also required manual wax removal with an ear loop.   Tolerated well. Complications: None.    Postprocedure instructions :  Call if problems.      Assessment & Plan:

## 2013-06-17 NOTE — Patient Instructions (Signed)
Endograft repair '01 Repair of Endoleak with bilateral iliac stents Feb '11 [name]([place], [place]

## 2013-06-17 NOTE — Assessment & Plan Note (Signed)
Will irrigate 

## 2013-06-17 NOTE — Assessment & Plan Note (Signed)
Chronic  Continue with current prescription therapy as reflected on the Med list.  

## 2013-06-18 NOTE — Progress Notes (Signed)
ICD remote received. Sensing, impedances, auto capture thresholds consistent with previous device measurements. Histograms appropriate for patient and level of activity. 17 NSVT episodes and 1 not treated.   All other diagnostic data reviewed and is appropriate and stable for patient. Real time EGM demonstrates appropriate sensing and capture. Estimated longevity 12 years.  Plan to check remotely in 3 months, see in office annually.  ROV in July with Rick DuffBrooke Edmisten, [place].

## 2013-06-19 ENCOUNTER — Encounter: Payer: Self-pay | Admitting: Cardiology

## 2013-06-20 ENCOUNTER — Other Ambulatory Visit: Payer: Self-pay | Admitting: Internal Medicine

## 2013-06-28 ENCOUNTER — Ambulatory Visit (INDEPENDENT_AMBULATORY_CARE_PROVIDER_SITE_OTHER): Payer: Medicare Other | Admitting: Internal Medicine

## 2013-06-28 ENCOUNTER — Encounter: Payer: Self-pay | Admitting: Internal Medicine

## 2013-06-28 VITALS — BP 130/70 | HR 68 | Temp 98.1°F | Resp 16 | Wt 191.0 lb

## 2013-06-28 DIAGNOSIS — I2510 Atherosclerotic heart disease of native coronary artery without angina pectoris: Secondary | ICD-10-CM

## 2013-06-28 DIAGNOSIS — R42 Dizziness and giddiness: Secondary | ICD-10-CM

## 2013-06-28 MED ORDER — MECLIZINE HCL 12.5 MG PO TABS: 12.5000 mg | ORAL_TABLET | Freq: Three times a day (TID) | ORAL | Status: DC | PRN

## 2013-06-28 NOTE — Progress Notes (Signed)
Pre visit review using our clinic review tool, if applicable. No additional management support is needed unless otherwise documented below in the visit note. 

## 2013-06-28 NOTE — Progress Notes (Signed)
Subjective:   HPI  C/o bad vertigo since Fri - better  The patient presents for a follow-up of CHF, A fib, pacemaker  chronic hypertension, chronic dyslipidemia controlled with medicines  F/u  [facility] - Dr Bennie PieriniMark Farber - a complex long hx - STENT and STENT repair in 2011  BP Readings from Last 3 Encounters:  06/28/13 130/70  06/17/13 130/72  02/26/13 134/69   Wt Readings from Last 3 Encounters:  06/28/13 191 lb (86.637 kg)  06/17/13 191 lb (86.637 kg)  02/26/13 191 lb (86.637 kg)      Review of Systems  Constitutional: Negative for appetite change, fatigue and unexpected weight change.  HENT: Positive for hearing loss. Negative for congestion, nosebleeds, sneezing, sore throat and trouble swallowing.   Eyes: Negative for itching and visual disturbance.  Respiratory: Negative for cough.   Cardiovascular: Negative for chest pain, palpitations and leg swelling.  Gastrointestinal: Negative for nausea, diarrhea, blood in stool and abdominal distention.  Genitourinary: Negative for frequency and hematuria.  Musculoskeletal: Negative for back pain, gait problem, joint swelling and neck pain.  Skin: Negative for rash.  Neurological: Negative for dizziness, tremors, speech difficulty and weakness.  Psychiatric/Behavioral: Negative for suicidal ideas, sleep disturbance, dysphoric mood and agitation. The patient is not nervous/anxious.        Objective:   Physical Exam  Constitutional: He is oriented to person, place, and time. He appears well-developed. No distress.  NAD  HENT:  Mouth/Throat: Oropharynx is clear and moist. No oropharyngeal exudate.  Wax B  Eyes: Conjunctivae are normal. Pupils are equal, round, and reactive to light.  Neck: Normal range of motion. No JVD present. No thyromegaly present.  Cardiovascular: Normal rate, normal heart sounds and intact distal pulses.  Exam reveals no gallop and no friction rub.   No murmur heard. Pulmonary/Chest: Effort normal and  breath sounds normal. No respiratory distress. He has no wheezes. He has no rales. He exhibits no tenderness.  Abdominal: Soft. Bowel sounds are normal. He exhibits no distension and no mass. There is no tenderness. There is no rebound and no guarding.  Scars and a umbilical hernia  Musculoskeletal: Normal range of motion. He exhibits no edema and no tenderness.  Lymphadenopathy:    He has no cervical adenopathy.  Neurological: He is alert and oriented to person, place, and time. He has normal reflexes. No cranial nerve deficit. He exhibits normal muscle tone. He displays a negative Romberg sign. Coordination and gait normal.  No meningeal signs  Skin: Skin is warm and dry. No rash noted.  Psychiatric: He has a normal mood and affect. His behavior is normal. Judgment and thought content normal.  H-P (-) B Abd CT angio 01/12/09  IMPRESSION:  1. No definite endovascular leak is currently seen.  2. However, the native aneurysm sac has increased slightly in  size, now measuring 56 x 60 mm compared to 55 x 55 mm previously.  3. Incidental gallstones.  CTA PELVIS  Findings: The iliac limbs of the AAA stent opacify with no  significant abnormality noted. Both the internal and external  iliac arteries opacify. The urinary bladder is slightly thick-  walled. The prostate is moderately prominent. Postop changes from  prior left hemicolectomy are again noted.  Review of the MIP images confirms the above findings.  IMPRESSION:  1. Stable iliac limbs of AAA stent.  2. Probable some degree of bladder outlet obstruction with  moderately enlarged prostate.       Assessment & Plan:

## 2013-06-28 NOTE — Assessment & Plan Note (Signed)
5/15 ?BPV Resolved Meclizine

## 2013-06-29 ENCOUNTER — Encounter: Payer: Self-pay | Admitting: Internal Medicine

## 2013-07-10 ENCOUNTER — Ambulatory Visit (INDEPENDENT_AMBULATORY_CARE_PROVIDER_SITE_OTHER): Payer: Medicare Other | Admitting: *Deleted

## 2013-07-10 ENCOUNTER — Other Ambulatory Visit: Payer: Self-pay | Admitting: Internal Medicine

## 2013-07-10 DIAGNOSIS — Z8679 Personal history of other diseases of the circulatory system: Secondary | ICD-10-CM

## 2013-07-10 DIAGNOSIS — I4891 Unspecified atrial fibrillation: Secondary | ICD-10-CM

## 2013-07-10 DIAGNOSIS — Z7901 Long term (current) use of anticoagulants: Secondary | ICD-10-CM

## 2013-07-10 LAB — POCT INR: INR: 3.2

## 2013-07-31 ENCOUNTER — Ambulatory Visit (INDEPENDENT_AMBULATORY_CARE_PROVIDER_SITE_OTHER): Payer: Medicare Other | Admitting: *Deleted

## 2013-07-31 DIAGNOSIS — Z7901 Long term (current) use of anticoagulants: Secondary | ICD-10-CM

## 2013-07-31 DIAGNOSIS — Z8679 Personal history of other diseases of the circulatory system: Secondary | ICD-10-CM

## 2013-07-31 DIAGNOSIS — I4891 Unspecified atrial fibrillation: Secondary | ICD-10-CM

## 2013-07-31 LAB — POCT INR: INR: 1.2

## 2013-08-09 ENCOUNTER — Ambulatory Visit (INDEPENDENT_AMBULATORY_CARE_PROVIDER_SITE_OTHER): Payer: Medicare Other

## 2013-08-09 DIAGNOSIS — Z8679 Personal history of other diseases of the circulatory system: Secondary | ICD-10-CM

## 2013-08-09 DIAGNOSIS — Z7901 Long term (current) use of anticoagulants: Secondary | ICD-10-CM

## 2013-08-09 DIAGNOSIS — I4891 Unspecified atrial fibrillation: Secondary | ICD-10-CM

## 2013-08-09 LAB — POCT INR: INR: 2.4

## 2013-08-27 ENCOUNTER — Ambulatory Visit (INDEPENDENT_AMBULATORY_CARE_PROVIDER_SITE_OTHER): Payer: Medicare Other | Admitting: Internal Medicine

## 2013-08-27 ENCOUNTER — Encounter: Payer: Self-pay | Admitting: Internal Medicine

## 2013-08-27 VITALS — BP 122/64 | HR 66 | Ht 73.0 in | Wt 190.0 lb

## 2013-08-27 DIAGNOSIS — I5022 Chronic systolic (congestive) heart failure: Secondary | ICD-10-CM

## 2013-08-27 DIAGNOSIS — I255 Ischemic cardiomyopathy: Secondary | ICD-10-CM

## 2013-08-27 DIAGNOSIS — I2589 Other forms of chronic ischemic heart disease: Secondary | ICD-10-CM

## 2013-08-27 DIAGNOSIS — I2510 Atherosclerotic heart disease of native coronary artery without angina pectoris: Secondary | ICD-10-CM

## 2013-08-27 DIAGNOSIS — Z95810 Presence of automatic (implantable) cardiac defibrillator: Secondary | ICD-10-CM

## 2013-08-27 LAB — MDC_IDC_ENUM_SESS_TYPE_INCLINIC
Brady Statistic RV Percent Paced: 1 %
Date Time Interrogation Session: 20150714040000
HighPow Impedance: 29 Ohm
HighPow Impedance: 51 Ohm
Lead Channel Pacing Threshold Amplitude: 0.8 V
Lead Channel Pacing Threshold Pulse Width: 0.4 ms
Lead Channel Sensing Intrinsic Amplitude: 5.6 mV
Lead Channel Setting Pacing Pulse Width: 0.4 ms
Lead Channel Setting Sensing Sensitivity: 0.4 mV
MDC IDC MSMT LEADCHNL RV IMPEDANCE VALUE: 497 Ohm
MDC IDC PG SERIAL: 119203
MDC IDC SET LEADCHNL RV PACING AMPLITUDE: 2.4 V
Zone Setting Detection Interval: 250 ms
Zone Setting Detection Interval: 300 ms
Zone Setting Detection Interval: 375 ms

## 2013-08-27 NOTE — Patient Instructions (Signed)
Labs today: Magnesium, BMET  Your physician recommends that you continue on your current medications as directed. Please refer to the Current Medication list given to you today.  Remote monitoring is used to monitor your Pacemaker of ICD from home. This monitoring reduces the number of office visits required to check your device to one time per year. It allows us to keep an eye on the functioning of your device to ensure it is working properly. You are scheduled for a device check from home on 11/28/13. You may send your transmission at any time that day. If you have a wireless device, the transmission will be sent automatically. After your physician reviews your transmission, you will receive a postcard with your next transmission date.  Your physician wants you to follow-up in: 1 year with Dr. Graciela HusbandsKlein.  You will receive a reminder letter in the mail two months in advance. If you don't receive a letter, please call our office to schedule the follow-up appointment.

## 2013-08-27 NOTE — Progress Notes (Signed)
Patient Care Team: Tresa GarterAleksei Plotnikov V, MD as PCP - General (Internal Medicine) Duke SalviaSteven C Klein, MD (Cardiology) Lamar Benesharles R Epes, MD (Ophthalmology) Suszanne FinchMark A Farber, MD (Vascular Surgery)   HPI  [name] BoyerJohn E [name] is a 80 y.o. male Seen in followup for ischemic cardiomyopathy And status post CABG in 1991;  he is status post ICD implantation  .  Catheterization 2005 demonstrated patent vein grafts. Echo cardiogram 2010 demonstrated stable left ventricular function at 25%.   The patient denies chest pain, shortness of breath, nocturnal dyspnea, orthopnea or peripheral edema. There have been no palpitations, lightheadedness or syncope.   He is caring for his wife with her ever increasing needs   Past Medical History  Diagnosis Date  . Atrial fibrillation     AFIB  . Ischemic cardiomyopathy     CABG in 1991; last catheter 2005 with patent vein graft to PD, diagonal, marginal/echo 2010 EF 25%.  . 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 Abdominal aneurysm without mention of rupture     s/p stent graft with endoleak requiring restenting-[facility]  . Chronic systolic heart failure     SYSTOLIC .Marland Kitchen.ACUTE ON CHRONIC  . Hypertension   . Peripheral vascular disease, unspecified   . Hemoptysis   . Acute prostatitis   . Labyrinthitis, unspecified   . Unspecified hearing loss   . Cervicalgia   . Stroke     Past Surgical History  Procedure Laterality Date  . Insert / replace / remove pacemaker  2006    AICD GUIDANT VITALITY..IN SITU  . Colectomy  07/15/96  . Colostomy  07/15/96    COLOSTOMY TAKEN DOWN 01/14/97  . Coronary artery bypass graft  03/22/89  . Cardiac catheterization  2001    ANGIOPLASTY W/STENT RCA  . Abdominal aortic aneurysm repair  05/1999    STENT, ENDOVASCULAR RELINING OF GRAFT 3/11  . Cardioversion      Current Outpatient Prescriptions  Medication Sig Dispense Refill  . acetaminophen (TYLENOL) 500 MG tablet Take 500 mg by mouth 2  (two) times daily.       . benazepril (LOTENSIN) 20 MG tablet TAKE 1 TABLET BY MOUTH EVERY DAY  90 tablet  3  . Calcium Carbonate-Vit D-Min 600-400 MG-UNIT TABS Take 1 tablet by mouth as needed.       . Copper Gluconate 2 MG TABS Take 1 tablet by mouth daily.      . diclofenac sodium (VOLTAREN) 1 % GEL Apply 1 application topically daily as needed. APPLY 4 GRAMS TO AFFECTED AREA TWO TIMES A DAY. For pain  3 Tube  11  . dofetilide (TIKOSYN) 125 MCG capsule TAKE ONE CAPSULE BY MOUTH TWICE A DAY  180 capsule  3  . fish oil-omega-3 fatty acids 1000 MG capsule Take 1 g by mouth daily.        . Flaxseed, Linseed, (FLAXSEED OIL) 1200 MG CAPS Take 1 capsule by mouth daily.        . furosemide (LASIX) 40 MG tablet Take 40 mg by mouth 4 (four) times a week. Monday,tuesday,thursday, and saturday      . Magnesium 250 MG TABS Take 1 tablet by mouth every evening.       . meclizine (ANTIVERT) 12.5 MG tablet Take 1 tablet (12.5 mg total) by mouth 3 (three) times daily as needed for dizziness or nausea.  60 tablet  1  . Multiple Vitamin (MULTIVITAMIN) tablet Take 1 tablet by mouth daily.       .Marland Kitchen  nitroGLYCERIN (NITROSTAT) 0.4 MG SL tablet Place 0.4 mg under the tongue every 5 (five) minutes as needed. For chest pain      . simvastatin (ZOCOR) 40 MG tablet TAKE 1/2 TABL ET BY MOUTH EVERY DAY  45 tablet  2  . warfarin (COUMADIN) 5 MG tablet 1 tablet daily, except take 1/2 tablet on Fridays or as directed by coumadin clinic  95 tablet  1   No current facility-administered medications for this visit.    Allergies  Allergen Reactions  . Promethazine Hcl     REACTION: world spins..    Review of Systems negative except from HPI and PMH  Physical Exam BP 122/64  Pulse 66  Ht 6\' 1"  (1.854 m)  Wt 190 lb (86.183 kg)  BMI 25.07 kg/m2 Well developed and well nourished in no acute distress HENT normal E scleral and icterus clear Neck Supple JVP flat; carotids brisk and full Clear to ausculation Device pocket  well healed; without hematoma or erythema.  There is no tethering *Regular rate and rhythm, no murmurs gallops or rub Soft with active bowel sounds No clubbing cyanosis  Edema Alert and oriented, grossly normal motor and sensory function Skin Warm and Dry    Assessment and  Plan  Ischemic Cardiomyopathy  Implantable Defib [employer] The patient's device was interrogated.  The information was reviewed. No changes were made in the programming.      Without symptoms of ischemia

## 2013-08-28 LAB — BASIC METABOLIC PANEL
BUN: 30 mg/dL — ABNORMAL HIGH (ref 6–23)
CHLORIDE: 101 meq/L (ref 96–112)
CO2: 27 meq/L (ref 19–32)
CREATININE: 1.6 mg/dL — AB (ref 0.4–1.5)
Calcium: 9.1 mg/dL (ref 8.4–10.5)
GFR: 43.85 mL/min — ABNORMAL LOW (ref >60.00)
Glucose, Bld: 96 mg/dL (ref 70–99)
Potassium: 4.1 mEq/L (ref 3.5–5.1)
Sodium: 136 mEq/L (ref 135–145)

## 2013-08-28 LAB — MAGNESIUM: Magnesium: 2.2 mg/dL (ref 1.5–2.5)

## 2013-09-06 ENCOUNTER — Ambulatory Visit (INDEPENDENT_AMBULATORY_CARE_PROVIDER_SITE_OTHER): Payer: Medicare Other | Admitting: *Deleted

## 2013-09-06 DIAGNOSIS — I4891 Unspecified atrial fibrillation: Secondary | ICD-10-CM

## 2013-09-06 DIAGNOSIS — Z7901 Long term (current) use of anticoagulants: Secondary | ICD-10-CM

## 2013-09-06 DIAGNOSIS — Z8679 Personal history of other diseases of the circulatory system: Secondary | ICD-10-CM

## 2013-09-06 LAB — POCT INR: INR: 3.5

## 2013-09-16 ENCOUNTER — Other Ambulatory Visit (INDEPENDENT_AMBULATORY_CARE_PROVIDER_SITE_OTHER): Payer: Medicare Other

## 2013-09-16 DIAGNOSIS — I714 Abdominal aortic aneurysm, without rupture, unspecified: Secondary | ICD-10-CM

## 2013-09-16 DIAGNOSIS — I2589 Other forms of chronic ischemic heart disease: Secondary | ICD-10-CM

## 2013-09-16 DIAGNOSIS — I2510 Atherosclerotic heart disease of native coronary artery without angina pectoris: Secondary | ICD-10-CM

## 2013-09-16 DIAGNOSIS — N320 Bladder-neck obstruction: Secondary | ICD-10-CM

## 2013-09-16 DIAGNOSIS — H6120 Impacted cerumen, unspecified ear: Secondary | ICD-10-CM

## 2013-09-16 DIAGNOSIS — I255 Ischemic cardiomyopathy: Secondary | ICD-10-CM

## 2013-09-16 DIAGNOSIS — Z7901 Long term (current) use of anticoagulants: Secondary | ICD-10-CM

## 2013-09-16 DIAGNOSIS — Z79899 Other long term (current) drug therapy: Secondary | ICD-10-CM

## 2013-09-16 DIAGNOSIS — Z125 Encounter for screening for malignant neoplasm of prostate: Secondary | ICD-10-CM

## 2013-09-16 DIAGNOSIS — Z95810 Presence of automatic (implantable) cardiac defibrillator: Secondary | ICD-10-CM

## 2013-09-16 DIAGNOSIS — I4891 Unspecified atrial fibrillation: Secondary | ICD-10-CM | POA: Diagnosis not present

## 2013-09-16 LAB — URINALYSIS
Bilirubin Urine: NEGATIVE
KETONES UR: NEGATIVE
Leukocytes, UA: NEGATIVE
Nitrite: NEGATIVE
PH: 6 (ref 5.0–8.0)
TOTAL PROTEIN, URINE-UPE24: NEGATIVE
URINE GLUCOSE: NEGATIVE
Urobilinogen, UA: 0.2 (ref 0.0–1.0)

## 2013-09-16 LAB — CBC WITH DIFFERENTIAL/PLATELET
Basophils Absolute: 0 10*3/uL (ref 0.0–0.1)
Basophils Relative: 0.4 % (ref 0.0–3.0)
Eosinophils Absolute: 0.1 10*3/uL (ref 0.0–0.7)
Eosinophils Relative: 2.1 % (ref 0.0–5.0)
HEMATOCRIT: 40 % (ref 39.0–52.0)
Hemoglobin: 13.6 g/dL (ref 13.0–17.0)
Lymphocytes Relative: 22.4 % (ref 12.0–46.0)
Lymphs Abs: 1.2 10*3/uL (ref 0.7–4.0)
MCHC: 33.9 g/dL (ref 30.0–36.0)
MCV: 101.2 fl — ABNORMAL HIGH (ref 78.0–100.0)
MONOS PCT: 9.7 % (ref 3.0–12.0)
Monocytes Absolute: 0.5 10*3/uL (ref 0.1–1.0)
Neutro Abs: 3.6 10*3/uL (ref 1.4–7.7)
Neutrophils Relative %: 65.4 % (ref 43.0–77.0)
Platelets: 87 10*3/uL — ABNORMAL LOW (ref 150.0–400.0)
RBC: 3.95 Mil/uL — ABNORMAL LOW (ref 4.22–5.81)
RDW: 14.3 % (ref 11.5–15.5)
WBC: 5.5 10*3/uL (ref 4.0–10.5)

## 2013-09-16 LAB — LIPID PANEL
CHOLESTEROL: 129 mg/dL (ref 0–200)
HDL: 37 mg/dL — ABNORMAL LOW (ref >39.00)
LDL Cholesterol: 75 mg/dL (ref 0–99)
NonHDL: 92
Total CHOL/HDL Ratio: 3
Triglycerides: 85 mg/dL (ref 0.0–149.0)
VLDL: 17 mg/dL (ref 0.0–40.0)

## 2013-09-16 LAB — BASIC METABOLIC PANEL
BUN: 26 mg/dL — AB (ref 6–23)
CALCIUM: 9.1 mg/dL (ref 8.4–10.5)
CHLORIDE: 104 meq/L (ref 96–112)
CO2: 28 meq/L (ref 19–32)
Creatinine, Ser: 1.4 mg/dL (ref 0.4–1.5)
GFR: 52.44 mL/min — AB (ref >60.00)
GLUCOSE: 90 mg/dL (ref 70–99)
Potassium: 4.7 mEq/L (ref 3.5–5.1)
Sodium: 137 mEq/L (ref 135–145)

## 2013-09-16 LAB — HEPATIC FUNCTION PANEL
ALK PHOS: 48 U/L (ref 39–117)
ALT: 18 U/L (ref 0–53)
AST: 23 U/L (ref 0–37)
Albumin: 3.8 g/dL (ref 3.5–5.2)
BILIRUBIN DIRECT: 0.3 mg/dL (ref 0.0–0.3)
Total Bilirubin: 1.3 mg/dL — ABNORMAL HIGH (ref 0.2–1.2)
Total Protein: 6.9 g/dL (ref 6.0–8.3)

## 2013-09-16 LAB — VITAMIN B12: VITAMIN B 12: 639 pg/mL (ref 211–911)

## 2013-09-16 LAB — TSH: TSH: 0.83 u[IU]/mL (ref 0.35–4.50)

## 2013-09-16 LAB — PSA: PSA: 1.29 ng/mL (ref 0.10–4.00)

## 2013-09-17 ENCOUNTER — Encounter: Payer: Self-pay | Admitting: Internal Medicine

## 2013-09-17 ENCOUNTER — Ambulatory Visit (INDEPENDENT_AMBULATORY_CARE_PROVIDER_SITE_OTHER): Payer: Medicare Other | Admitting: Internal Medicine

## 2013-09-17 VITALS — BP 120/62 | HR 72 | Temp 97.8°F | Resp 16 | Wt 191.0 lb

## 2013-09-17 DIAGNOSIS — I255 Ischemic cardiomyopathy: Secondary | ICD-10-CM

## 2013-09-17 DIAGNOSIS — R42 Dizziness and giddiness: Secondary | ICD-10-CM

## 2013-09-17 DIAGNOSIS — I2589 Other forms of chronic ischemic heart disease: Secondary | ICD-10-CM

## 2013-09-17 DIAGNOSIS — D693 Immune thrombocytopenic purpura: Secondary | ICD-10-CM | POA: Insufficient documentation

## 2013-09-17 DIAGNOSIS — E785 Hyperlipidemia, unspecified: Secondary | ICD-10-CM

## 2013-09-17 DIAGNOSIS — I739 Peripheral vascular disease, unspecified: Secondary | ICD-10-CM

## 2013-09-17 DIAGNOSIS — I714 Abdominal aortic aneurysm, without rupture, unspecified: Secondary | ICD-10-CM

## 2013-09-17 DIAGNOSIS — I2510 Atherosclerotic heart disease of native coronary artery without angina pectoris: Secondary | ICD-10-CM

## 2013-09-17 DIAGNOSIS — I5022 Chronic systolic (congestive) heart failure: Secondary | ICD-10-CM

## 2013-09-17 LAB — VITAMIN D 25 HYDROXY (VIT D DEFICIENCY, FRACTURES): VIT D 25 HYDROXY: 36 ng/mL (ref 30–89)

## 2013-09-17 NOTE — Assessment & Plan Note (Signed)
Continue with current prescription therapy as reflected on the Med list.  

## 2013-09-17 NOTE — Progress Notes (Signed)
Pre visit review using our clinic review tool, if applicable. No additional management support is needed unless otherwise documented below in the visit note. 

## 2013-09-17 NOTE — Assessment & Plan Note (Signed)
2015 chronic - poss mild MDS

## 2013-09-17 NOTE — Progress Notes (Signed)
   Subjective:   HPI   F/u vertigo - resolved, fatigue - better  The patient presents for a follow-up of CHF, A fib, pacemaker  chronic hypertension, chronic dyslipidemia controlled with medicines  F/u  [facility] - Dr Bennie PieriniMark Farber - a complex long hx - STENT and STENT repair in 2011    Review of Systems  Constitutional: Negative for appetite change, fatigue and unexpected weight change.  HENT: Positive for hearing loss. Negative for congestion, nosebleeds, sneezing, sore throat and trouble swallowing.   Eyes: Negative for itching and visual disturbance.  Respiratory: Negative for cough.   Cardiovascular: Negative for chest pain, palpitations and leg swelling.  Gastrointestinal: Negative for nausea, diarrhea, blood in stool and abdominal distention.  Genitourinary: Negative for frequency and hematuria.  Musculoskeletal: Negative for back pain, gait problem, joint swelling and neck pain.  Skin: Negative for rash.  Neurological: Negative for dizziness, tremors, speech difficulty and weakness.  Psychiatric/Behavioral: Negative for suicidal ideas, sleep disturbance, dysphoric mood and agitation. The patient is not nervous/anxious.        Objective:   Physical Exam  Constitutional: He is oriented to person, place, and time. He appears well-developed. No distress.  NAD  HENT:  Mouth/Throat: Oropharynx is clear and moist. No oropharyngeal exudate.  Wax B  Eyes: Conjunctivae are normal. Pupils are equal, round, and reactive to light.  Neck: Normal range of motion. No JVD present. No thyromegaly present.  Cardiovascular: Normal rate, normal heart sounds and intact distal pulses.  Exam reveals no gallop and no friction rub.   No murmur heard. Pulmonary/Chest: Effort normal and breath sounds normal. No respiratory distress. He has no wheezes. He has no rales. He exhibits no tenderness.  Abdominal: Soft. Bowel sounds are normal. He exhibits no distension and no mass. There is no tenderness.  There is no rebound and no guarding.  Scars and a umbilical hernia  Musculoskeletal: Normal range of motion. He exhibits no edema and no tenderness.  Lymphadenopathy:    He has no cervical adenopathy.  Neurological: He is alert and oriented to person, place, and time. He has normal reflexes. No cranial nerve deficit. He exhibits normal muscle tone. He displays a negative Romberg sign. Coordination and gait normal.  No meningeal signs  Skin: Skin is warm and dry. No rash noted.  Psychiatric: He has a normal mood and affect. His behavior is normal. Judgment and thought content normal.   Abd CT angio 01/12/09  IMPRESSION:  1. No definite endovascular leak is currently seen.  2. However, the native aneurysm sac has increased slightly in  size, now measuring 56 x 60 mm compared to 55 x 55 mm previously.  3. Incidental gallstones.  CTA PELVIS  Findings: The iliac limbs of the AAA stent opacify with no  significant abnormality noted. Both the internal and external  iliac arteries opacify. The urinary bladder is slightly thick-  walled. The prostate is moderately prominent. Postop changes from  prior left hemicolectomy are again noted.  Review of the MIP images confirms the above findings.  IMPRESSION:  1. Stable iliac limbs of AAA stent.  2. Probable some degree of bladder outlet obstruction with  moderately enlarged prostate.      Assessment & Plan:

## 2013-09-17 NOTE — Assessment & Plan Note (Signed)
Resolved

## 2013-09-24 ENCOUNTER — Ambulatory Visit (INDEPENDENT_AMBULATORY_CARE_PROVIDER_SITE_OTHER): Payer: Medicare Other | Admitting: Pharmacist

## 2013-09-24 DIAGNOSIS — Z8679 Personal history of other diseases of the circulatory system: Secondary | ICD-10-CM

## 2013-09-24 DIAGNOSIS — Z7901 Long term (current) use of anticoagulants: Secondary | ICD-10-CM

## 2013-09-24 DIAGNOSIS — I4891 Unspecified atrial fibrillation: Secondary | ICD-10-CM

## 2013-09-24 LAB — POCT INR: INR: 3

## 2013-10-02 ENCOUNTER — Other Ambulatory Visit: Payer: Self-pay | Admitting: Internal Medicine

## 2013-10-16 ENCOUNTER — Other Ambulatory Visit: Payer: Self-pay | Admitting: Internal Medicine

## 2013-10-22 ENCOUNTER — Ambulatory Visit (INDEPENDENT_AMBULATORY_CARE_PROVIDER_SITE_OTHER): Payer: Medicare Other | Admitting: *Deleted

## 2013-10-22 DIAGNOSIS — Z7901 Long term (current) use of anticoagulants: Secondary | ICD-10-CM

## 2013-10-22 DIAGNOSIS — Z8679 Personal history of other diseases of the circulatory system: Secondary | ICD-10-CM

## 2013-10-22 DIAGNOSIS — I4891 Unspecified atrial fibrillation: Secondary | ICD-10-CM

## 2013-10-22 LAB — POCT INR: INR: 3.4

## 2013-11-05 ENCOUNTER — Ambulatory Visit (INDEPENDENT_AMBULATORY_CARE_PROVIDER_SITE_OTHER): Payer: Medicare Other | Admitting: *Deleted

## 2013-11-05 DIAGNOSIS — Z7901 Long term (current) use of anticoagulants: Secondary | ICD-10-CM

## 2013-11-05 DIAGNOSIS — Z8679 Personal history of other diseases of the circulatory system: Secondary | ICD-10-CM

## 2013-11-05 DIAGNOSIS — I4891 Unspecified atrial fibrillation: Secondary | ICD-10-CM

## 2013-11-05 LAB — POCT INR: INR: 2.5

## 2013-11-19 ENCOUNTER — Ambulatory Visit (INDEPENDENT_AMBULATORY_CARE_PROVIDER_SITE_OTHER): Payer: Medicare Other | Admitting: *Deleted

## 2013-11-19 DIAGNOSIS — Z7901 Long term (current) use of anticoagulants: Secondary | ICD-10-CM

## 2013-11-19 DIAGNOSIS — I4891 Unspecified atrial fibrillation: Secondary | ICD-10-CM

## 2013-11-19 DIAGNOSIS — Z8679 Personal history of other diseases of the circulatory system: Secondary | ICD-10-CM

## 2013-11-19 LAB — POCT INR: INR: 2.4

## 2013-11-26 ENCOUNTER — Ambulatory Visit (INDEPENDENT_AMBULATORY_CARE_PROVIDER_SITE_OTHER): Payer: Medicare Other | Admitting: Internal Medicine

## 2013-11-26 ENCOUNTER — Encounter: Payer: Self-pay | Admitting: Internal Medicine

## 2013-11-26 VITALS — BP 112/70 | HR 83 | Temp 97.9°F | Wt 188.1 lb

## 2013-11-26 DIAGNOSIS — R21 Rash and other nonspecific skin eruption: Secondary | ICD-10-CM

## 2013-11-26 DIAGNOSIS — M1712 Unilateral primary osteoarthritis, left knee: Secondary | ICD-10-CM

## 2013-11-26 DIAGNOSIS — M25462 Effusion, left knee: Secondary | ICD-10-CM

## 2013-11-26 DIAGNOSIS — I2510 Atherosclerotic heart disease of native coronary artery without angina pectoris: Secondary | ICD-10-CM

## 2013-11-26 NOTE — Patient Instructions (Signed)
Dip gauze in  sterile saline and applied to the knee twice a day. No antibiotic ointment. The saline can be purchased at the drugstore or you can make your own .Boil cup of salt in a gallon of water. Store mixture  in a clean container.Report Warning  signs as discussed (red streaks, pus, fever, increasing pain).  The referral to Dr Terrilee Fileszach Smith will be scheduled and you'll be notified of the time.Please call the Referral Co-Ordinator @ [phone] if you have not been notified of appointment time within 7-10 days.

## 2013-11-26 NOTE — Progress Notes (Signed)
Pre visit review using our clinic review tool, if applicable. No additional management support is needed unless otherwise documented below in the visit note. 

## 2013-11-26 NOTE — Progress Notes (Signed)
   Subjective:    Patient ID: [name], male    DOB: 1926-09-04, 80 y.o.   MRN: [id number]  HPI    Symptoms began in August of this year as itching of the left knee. It has progressed despite topical therapies initially with Eucerin and Cetaphil moisturizing lotions. He's actually been using these for years.  Subsequently he applied an antibiotic appointment, iodine, and zinc oxide; he rotates these every third day.  At times noted is oozing of plasma like material. There's been no purulence.  He has now stopped using anything and there appeared to be improvement in the appearance of the lesions.  The condition is associated with minimal pain and swelling. When it is bumped he describes discomfort up to level V. Discomfort can last minutes.    Review of Systems   He denies fever, chills, or sweats. He has no unexplained weight loss     Objective:   Physical Exam   Positive or pertinent findings include: He has bilateral hearing aids He has a  Mustache & pattern alopecia There is a faint grade 1/2 systolic murmur. Initially he had some rales at the bases which cleared with deep inspiration Although he has a history of aortic aneurysm; I cannot palpate such There are marked varicose veins of the left lower extremity greater than the right. Pedal pulses are decreased but equal. Left great toenail is thickened and discolored. The right is markedly deformed There are irregular stasis hyperpigmentation changes over the left lower extremity. There is brawny thickened skin over the left knee with slight exfoliative changes. This is slightly tender to palpation. Clinically there is no evidence of cellulitis He does have significant crepitus of left knee with suggestion of effusion.        Assessment & Plan:  #1 skin changes; probable component of allergic reaction to antibiotic ointment  #2 degenerative joint disease with slight effusion  Plan: See visit summary. He will be  referred to Dr. Terrilee FilesZach Smith for definitive treatment of any patellar effusion.

## 2013-11-28 ENCOUNTER — Other Ambulatory Visit: Payer: Self-pay | Admitting: Internal Medicine

## 2013-11-28 ENCOUNTER — Ambulatory Visit: Payer: Medicare Other | Admitting: *Deleted

## 2013-11-28 DIAGNOSIS — I255 Ischemic cardiomyopathy: Secondary | ICD-10-CM

## 2013-11-29 NOTE — Progress Notes (Signed)
Remote ICD transmission.   

## 2013-12-03 ENCOUNTER — Telehealth: Payer: Self-pay | Admitting: *Deleted

## 2013-12-03 NOTE — Telephone Encounter (Signed)
Terrilee FilesZach [name] ASAP please

## 2013-12-03 NOTE — Telephone Encounter (Signed)
Called pt gave him md response. Made appt for this thurs @ :...Raechel Chute/lmb

## 2013-12-03 NOTE — Telephone Encounter (Signed)
Left msg on triage stating saw Dr. Alwyn RenHopper last week concerning his knee, been doing the recommendation from md with the saline, but now he has develop some swelling. Requesting md advisement on what to do...Raechel Chute/lmb

## 2013-12-05 ENCOUNTER — Other Ambulatory Visit (INDEPENDENT_AMBULATORY_CARE_PROVIDER_SITE_OTHER): Payer: Medicare Other

## 2013-12-05 ENCOUNTER — Ambulatory Visit (INDEPENDENT_AMBULATORY_CARE_PROVIDER_SITE_OTHER): Payer: Medicare Other | Admitting: Family Medicine

## 2013-12-05 ENCOUNTER — Encounter: Payer: Self-pay | Admitting: Family Medicine

## 2013-12-05 VITALS — BP 122/78 | HR 73 | Wt 189.0 lb

## 2013-12-05 DIAGNOSIS — M71169 Other infective bursitis, unspecified knee: Secondary | ICD-10-CM | POA: Insufficient documentation

## 2013-12-05 DIAGNOSIS — M25562 Pain in left knee: Secondary | ICD-10-CM

## 2013-12-05 DIAGNOSIS — I2510 Atherosclerotic heart disease of native coronary artery without angina pectoris: Secondary | ICD-10-CM

## 2013-12-05 DIAGNOSIS — M7042 Prepatellar bursitis, left knee: Secondary | ICD-10-CM

## 2013-12-05 DIAGNOSIS — M71162 Other infective bursitis, left knee: Secondary | ICD-10-CM

## 2013-12-05 MED ORDER — CEPHALEXIN 500 MG PO CAPS: 500.0000 mg | ORAL_CAPSULE | Freq: Three times a day (TID) | ORAL | Status: DC

## 2013-12-05 NOTE — Assessment & Plan Note (Addendum)
Patient did have aspiration done today. Patient tolerated the procedure very well. Patient had significant decrease in swelling in the area after was done. We discussed with patient to continue topical mupirocin and patient was given samples as well as daily dressing changes over the course of the next 4 days. Patient is an avid Counselling psychologistswimmer and told to stay out of the pool for the next 72 hours. Patient will be covered with Keflex. Due to patient being on Coumadin we are unable to use ciprofloxacin or doxycycline if we need any broader coverage. If patient has any worsening I would broaden to doxycycline. Patient will come back in one week for further evaluation. At that time he may need to have it further open the area and hopefully we'll be doing very well and will be able to continue to monitor with conservative therapy.

## 2013-12-05 NOTE — Patient Instructions (Addendum)
You had an infected bursitis.  Keflex 3 times daily for next 10 days.  Dressing changes daily No pool for 3 days See me again in 1 week.

## 2013-12-05 NOTE — Progress Notes (Signed)
Tawana ScaleZach Alba Kriesel D.O. [facility] [address] [place], [place] [address] Phone: [phone] Subjective:    I'm seeing this patient by the request  of:  Hopper M.D.   CC: Left knee swelling  BJY:NWGNFAOZHYHPI:Subjective [name] is a 80 y.o. male coming in with complaint of left knee swelling. Patient states that this made started approximately 2 months ago. Started with 10 of redness to the foot as needed been started to expand. Patient states over the course of several weeks ago it has started to increase in severity of pain. Patient has tried multiple topical therapies including lotions as well as anti-inflammatories with no significant improvement. Patient applied antibiotic ointment as well as iodine and zinc oxide and does not seem to be beneficial. Patient states sometimes there seems to be an oozing plasma-like material from the knee but denies any true pus. Patient states that he puts pressure on the knee it has severe pain approximately 6 or 7/10. Denies any fevers or chills or any abnormal weight loss. States that he can  ambulate well.     Past medical history, social, surgical and family history all reviewed in electronic medical record.   Review of Systems: No headache, visual changes, nausea, vomiting, diarrhea, constipation, dizziness, abdominal pain, skin rash, fevers, chills, night sweats, weight loss, swollen lymph nodes, body aches, joint swelling, muscle aches, chest pain, shortness of breath, mood changes.   Objective Blood pressure 122/78, pulse 73, weight 189 lb (85.73 kg), SpO2 98.00%.  General: No apparent distress alert and oriented x3 mood and affect normal, dressed appropriately.  HEENT: Pupils equal, extraocular movements intact  Respiratory: Patient's speak in full sentences and does not appear short of breath  Cardiovascular: No lower extremity edema, non tender, no erythema  Skin: Warm dry intact with no signs of infection or rash on extremities or on axial  skeleton.  Abdomen: Soft nontender  Neuro: Cranial nerves II through XII are intact, neurovascularly intact in all extremities with 2+ DTRs and 2+ pulses.  Lymph: No lymphadenopathy of posterior or anterior cervical chain or axillae bilaterally.  Gait normal with good balance and coordination.  MSK:  Non tender with full range of motion and good stability and symmetric strength and tone of shoulders, elbows, wrist, hip, and ankles bilaterally.  Knee: Left On inspection patient has severe swelling of the prepatellar bursa. Significant erythema of the skin is also noted and minorly warm to palpation. Tender over the prepatellar bursa. ROM full in flexion and extension and lower leg rotation. Ligaments with solid consistent endpoints including ACL, PCL, LCL, MCL. Negative Mcmurray's, Apley's, and Thessalonian tests. Contralateral knee unremarkable  MSK US performed of: Left knee This study was ordered, performed, and interpreted by Terrilee FilesZach Riann Oman D.O.  Knee: Limited ultrasound shows the patient does have significant swelling of the prepatellar bursa. This appears to have more than just fluid and has heterotrophic changes on ultrasound.  IMPRESSION:  Prepatellar bursitis likely infectious, ? Abscess.   Procedure: Real-time Ultrasound Guided Injection and aspiration of prepatellar bursa of left knee Device: GE Logiq E  Ultrasound guided injection is preferred based studies that show increased duration, increased effect, greater accuracy, decreased procedural pain, increased response rate, and decreased cost with ultrasound guided versus blind injection.  Verbal informed consent obtained.  Time-out conducted.  Noted no overlying erythema, induration, or other signs of local infection.  Skin prepped in a sterile fashion.  Local anesthesia: Topical Ethyl chloride.  With sterile technique and under real  time ultrasound guidance:  With a 21-gauge 2 inch needle under ultrasound guidance patient was  injected with 2 cc of 0.5% Marcaine. Patient then with a 10 blade a small incision over the anterior aspect of the knee into the bursa itself. Patient had copious amounts of pus removed. This is mostly dried-like material. Patient did have some bleeding secondary to patient being on Coumadin. Completed without difficulty  Pain immediately resolved suggesting accurate placement of the medication.  Advised to call if fevers/chills, erythema, induration, drainage, or persistent bleeding.  Images permanently stored and available for review in the ultrasound unit.  Impression: Technically successful ultrasound guided aspiration and injection.      Impression and Recommendations:     This case required medical decision making of moderate complexity.

## 2013-12-09 ENCOUNTER — Other Ambulatory Visit (INDEPENDENT_AMBULATORY_CARE_PROVIDER_SITE_OTHER): Payer: Medicare Other

## 2013-12-09 ENCOUNTER — Ambulatory Visit (INDEPENDENT_AMBULATORY_CARE_PROVIDER_SITE_OTHER): Payer: Medicare Other | Admitting: Internal Medicine

## 2013-12-09 ENCOUNTER — Other Ambulatory Visit: Payer: Self-pay | Admitting: Internal Medicine

## 2013-12-09 ENCOUNTER — Encounter: Payer: Self-pay | Admitting: Internal Medicine

## 2013-12-09 ENCOUNTER — Ambulatory Visit (INDEPENDENT_AMBULATORY_CARE_PROVIDER_SITE_OTHER)
Admission: RE | Admit: 2013-12-09 | Discharge: 2013-12-09 | Disposition: A | Discharge status: Home / Self Care | Payer: Medicare Other | Source: Ambulatory Visit | Attending: Internal Medicine | Admitting: Internal Medicine

## 2013-12-09 VITALS — BP 110/78 | HR 79 | Temp 97.7°F | Resp 16 | Wt 189.2 lb

## 2013-12-09 DIAGNOSIS — I5022 Chronic systolic (congestive) heart failure: Secondary | ICD-10-CM

## 2013-12-09 DIAGNOSIS — I4891 Unspecified atrial fibrillation: Secondary | ICD-10-CM

## 2013-12-09 DIAGNOSIS — R059 Cough, unspecified: Secondary | ICD-10-CM

## 2013-12-09 DIAGNOSIS — R05 Cough: Secondary | ICD-10-CM

## 2013-12-09 DIAGNOSIS — R946 Abnormal results of thyroid function studies: Secondary | ICD-10-CM

## 2013-12-09 DIAGNOSIS — I2510 Atherosclerotic heart disease of native coronary artery without angina pectoris: Secondary | ICD-10-CM

## 2013-12-09 LAB — CBC WITH DIFFERENTIAL/PLATELET
BASOS PCT: 0.2 % (ref 0.0–3.0)
Basophils Absolute: 0 10*3/uL (ref 0.0–0.1)
EOS ABS: 0 10*3/uL (ref 0.0–0.7)
Eosinophils Relative: 0.5 % (ref 0.0–5.0)
HEMATOCRIT: 39.7 % (ref 39.0–52.0)
HEMOGLOBIN: 13.2 g/dL (ref 13.0–17.0)
Lymphocytes Relative: 10.4 % — ABNORMAL LOW (ref 12.0–46.0)
Lymphs Abs: 0.8 10*3/uL (ref 0.7–4.0)
MCHC: 33.4 g/dL (ref 30.0–36.0)
MCV: 100 fl (ref 78.0–100.0)
Monocytes Absolute: 0.6 10*3/uL (ref 0.1–1.0)
Monocytes Relative: 7.7 % (ref 3.0–12.0)
NEUTROS ABS: 6.4 10*3/uL (ref 1.4–7.7)
Neutrophils Relative %: 81.2 % — ABNORMAL HIGH (ref 43.0–77.0)
Platelets: 112 10*3/uL — ABNORMAL LOW (ref 150.0–400.0)
RBC: 3.97 Mil/uL — AB (ref 4.22–5.81)
RDW: 14.4 % (ref 11.5–15.5)
WBC: 7.9 10*3/uL (ref 4.0–10.5)

## 2013-12-09 LAB — BASIC METABOLIC PANEL
BUN: 23 mg/dL (ref 6–23)
CHLORIDE: 100 meq/L (ref 96–112)
CO2: 23 mEq/L (ref 19–32)
Calcium: 9.7 mg/dL (ref 8.4–10.5)
Creatinine, Ser: 1.5 mg/dL (ref 0.4–1.5)
GFR: 49.09 mL/min — ABNORMAL LOW (ref >60.00)
GLUCOSE: 112 mg/dL — AB (ref 70–99)
POTASSIUM: 4.7 meq/L (ref 3.5–5.1)
SODIUM: 135 meq/L (ref 135–145)

## 2013-12-09 LAB — TSH: TSH: 0.31 u[IU]/mL — ABNORMAL LOW (ref 0.35–4.50)

## 2013-12-09 LAB — MAGNESIUM: MAGNESIUM: 2.2 mg/dL (ref 1.5–2.5)

## 2013-12-09 LAB — BRAIN NATRIURETIC PEPTIDE: Pro B Natriuretic peptide (BNP): 653 pg/mL — ABNORMAL HIGH (ref 0.0–100.0)

## 2013-12-09 NOTE — Progress Notes (Signed)
   Subjective:    Patient ID: [name], male    DOB: 1926-12-09, 80 y.o.   MRN: [id number]  HPI  Patient is seen today for evaluation of cough and weakness.  He states that his symptoms began about 3 weeks ago.  The weakness has been persistent. The cough has progressed.  His cough is non-productive.  He has no associated fevers or chills.  He does have clear nasal drainage.  No sinus pressure, sore throat, ear pain or ear discharge.   He also notes that his heart rates have been elevated from his normal for the last 3 weeks.  He has a history of atrial fibrillation and is maintained on Tikosyn.  He is anticoagulated with Warfarin.    He also has had orthopnea and occasional PND.  He had an episode on 12/05/13 where he awoke from sleep diaphoretic.  He wonders if his ICD fired.    He had left knee aspirated on 12/04/13 by Dr Katrinka BlazingSmith and has been covered with Keflex since that time.    Review of Systems     Objective:   Physical Exam        Assessment & Plan:

## 2013-12-09 NOTE — Progress Notes (Signed)
   Subjective:    Patient ID: [name], male    DOB: 02/07/1927, 80 y.o.   MRN: [id number]  HPI   He presents with nonproductive cough and weakness ; symptoms began 3 weeks ago. The weakness has not progressed but the cough has. There is no associated fever ,chills or purulence secretions  He has clear nasal drainage without symptoms of rhinosinusitis.  Over the last 3 weeks he has noted increased heart rate. He does have history of atrial fibrillation and is maintained on Tikostn. He is also on warfarin.  During this period he's had some orthopnea and occasional paroxysmal nocturnal dyspnea. Last episode was 12/05/13 when he awoke from sleep diaphoretic. He has an  ICD; he questioned whether it fired at that time.      Review of Systems Frontal headache, facial pain , nasal purulence, dental pain, sore throat , otic pain or otic discharge denied. Weigh has been stable.   His left knee was aspirated 12/04/13 by Dr. Katrinka BlazingSmith; he's been on Keflex since that time.     Objective:   Physical Exam Positive or pertinent findings include: He has bilateral hearing aids. Pupils are pinpoint but equal. There is an irregular, irregular rhythm. Breath sounds are generally decreased He has neck vein distention to 3 cm @ 15 He has a postoperative ventral hernia There is hyperpigmentation of the left lower extremity due to stasis. He has trace edema of right lower extremity and 1/2+ edema left lower extremity.   General appearance :adequately nourished; in no distress. Eyes: No conjunctival inflammation or scleral icterus is present. Oral exam: Dental hygiene is good. Lips and gums are healthy appearing.There is no oropharyngeal erythema or exudate noted.  Heart: S1 and S2 normal without gallop, murmur, click, rub or other extra sounds   Lungs:Chest clear to auscultation; no wheezes, rhonchi,rales ,or rubs present.No increased work of breathing.  Abdomen: bowel sounds normal, soft and  non-tender without masses or  organomegaly .  No guarding or rebound.  Vascular : all pulses equal ; no bruits present. Skin:Warm & dry.  Intact without suspicious lesions or rashes ; no jaundice or tenting Lymphatic: No lymphadenopathy is noted about the head, neck, axilla           Assessment & Plan:  #1 orthopnea and paroxysmal nocturnal dyspnea most likely related to recurrence of atrial fibrillation;R/O CHF  #2 No evidence of rhinosinusitis or infectious bronchitis  Plan: See orders and recommendations

## 2013-12-09 NOTE — Patient Instructions (Addendum)
Take Furosemide every day until seen by Cardiology.  Your next office appointment will be determined based upon review of your pending labs & x-rays. Those instructions will be transmitted to you through My Chart  OR  by mail;whichever process is your choice to receive results & recommendations .   Followup as needed for your acute issue. Please report any significant change in your symptoms.

## 2013-12-09 NOTE — Progress Notes (Signed)
Pre visit review using our clinic review tool, if applicable. No additional management support is needed unless otherwise documented below in the visit note. 

## 2013-12-10 ENCOUNTER — Other Ambulatory Visit (INDEPENDENT_AMBULATORY_CARE_PROVIDER_SITE_OTHER): Payer: Medicare Other

## 2013-12-10 ENCOUNTER — Telehealth: Payer: Self-pay

## 2013-12-10 ENCOUNTER — Telehealth: Payer: Self-pay | Admitting: Internal Medicine

## 2013-12-10 DIAGNOSIS — R7309 Other abnormal glucose: Secondary | ICD-10-CM

## 2013-12-10 LAB — HEMOGLOBIN A1C: HEMOGLOBIN A1C: 5.7 % (ref 4.6–6.5)

## 2013-12-10 NOTE — Telephone Encounter (Signed)
Message copied by Noreene LarssonANDREWS, Tonnie Stillman R on Tue Dec 10, 2013  8:03 AM ------      Message from: Pecola LawlessHOPPER, WILLIAM F      Created: Mon Dec 09, 2013  6:21 PM       Please add A1c (hyperglycemia) ------

## 2013-12-10 NOTE — Telephone Encounter (Signed)
Request for add on has been faxed to lab 

## 2013-12-10 NOTE — Telephone Encounter (Signed)
°  Patient's son is calling to follow up on procedure that Dr Graciela HusbandsKlein was going to do in the hospital today, please call and advise.

## 2013-12-11 ENCOUNTER — Inpatient Hospital Stay (HOSPITAL_COMMUNITY)
Admission: AD | Admit: 2013-12-11 | Discharge: 2013-12-13 | DRG: 308 | Disposition: A | Discharge status: Home / Self Care | Payer: Medicare Other | Source: Ambulatory Visit | Attending: Cardiovascular Disease | Admitting: Cardiovascular Disease

## 2013-12-11 ENCOUNTER — Ambulatory Visit (INDEPENDENT_AMBULATORY_CARE_PROVIDER_SITE_OTHER): Payer: Medicare Other | Admitting: *Deleted

## 2013-12-11 ENCOUNTER — Ambulatory Visit (INDEPENDENT_AMBULATORY_CARE_PROVIDER_SITE_OTHER): Payer: Medicare Other | Admitting: Physician Assistant

## 2013-12-11 ENCOUNTER — Encounter: Payer: Self-pay | Admitting: Physician Assistant

## 2013-12-11 VITALS — BP 120/71 | HR 95 | Ht 73.0 in | Wt 188.0 lb

## 2013-12-11 DIAGNOSIS — I5023 Acute on chronic systolic (congestive) heart failure: Secondary | ICD-10-CM | POA: Diagnosis present

## 2013-12-11 DIAGNOSIS — N183 Chronic kidney disease, stage 3 unspecified: Secondary | ICD-10-CM | POA: Diagnosis present

## 2013-12-11 DIAGNOSIS — Z8679 Personal history of other diseases of the circulatory system: Secondary | ICD-10-CM

## 2013-12-11 DIAGNOSIS — I2510 Atherosclerotic heart disease of native coronary artery without angina pectoris: Secondary | ICD-10-CM | POA: Diagnosis present

## 2013-12-11 DIAGNOSIS — I4891 Unspecified atrial fibrillation: Secondary | ICD-10-CM

## 2013-12-11 DIAGNOSIS — Z8261 Family history of arthritis: Secondary | ICD-10-CM

## 2013-12-11 DIAGNOSIS — Z7901 Long term (current) use of anticoagulants: Secondary | ICD-10-CM

## 2013-12-11 DIAGNOSIS — Z95810 Presence of automatic (implantable) cardiac defibrillator: Secondary | ICD-10-CM

## 2013-12-11 DIAGNOSIS — Z8673 Personal history of transient ischemic attack (TIA), and cerebral infarction without residual deficits: Secondary | ICD-10-CM

## 2013-12-11 DIAGNOSIS — I513 Intracardiac thrombosis, not elsewhere classified: Secondary | ICD-10-CM | POA: Diagnosis present

## 2013-12-11 DIAGNOSIS — M7042 Prepatellar bursitis, left knee: Secondary | ICD-10-CM | POA: Diagnosis present

## 2013-12-11 DIAGNOSIS — D696 Thrombocytopenia, unspecified: Secondary | ICD-10-CM | POA: Diagnosis present

## 2013-12-11 DIAGNOSIS — I255 Ischemic cardiomyopathy: Secondary | ICD-10-CM

## 2013-12-11 DIAGNOSIS — Z79899 Other long term (current) drug therapy: Secondary | ICD-10-CM

## 2013-12-11 DIAGNOSIS — E785 Hyperlipidemia, unspecified: Secondary | ICD-10-CM | POA: Diagnosis present

## 2013-12-11 DIAGNOSIS — Z87891 Personal history of nicotine dependence: Secondary | ICD-10-CM

## 2013-12-11 DIAGNOSIS — I480 Paroxysmal atrial fibrillation: Principal | ICD-10-CM | POA: Diagnosis present

## 2013-12-11 DIAGNOSIS — I4892 Unspecified atrial flutter: Secondary | ICD-10-CM | POA: Diagnosis present

## 2013-12-11 DIAGNOSIS — I129 Hypertensive chronic kidney disease with stage 1 through stage 4 chronic kidney disease, or unspecified chronic kidney disease: Secondary | ICD-10-CM | POA: Diagnosis present

## 2013-12-11 DIAGNOSIS — M71162 Other infective bursitis, left knee: Secondary | ICD-10-CM

## 2013-12-11 DIAGNOSIS — I4819 Other persistent atrial fibrillation: Secondary | ICD-10-CM

## 2013-12-11 DIAGNOSIS — I714 Abdominal aortic aneurysm, without rupture: Secondary | ICD-10-CM | POA: Diagnosis present

## 2013-12-11 DIAGNOSIS — Z823 Family history of stroke: Secondary | ICD-10-CM

## 2013-12-11 DIAGNOSIS — I739 Peripheral vascular disease, unspecified: Secondary | ICD-10-CM | POA: Diagnosis present

## 2013-12-11 DIAGNOSIS — Z951 Presence of aortocoronary bypass graft: Secondary | ICD-10-CM

## 2013-12-11 DIAGNOSIS — Z8249 Family history of ischemic heart disease and other diseases of the circulatory system: Secondary | ICD-10-CM

## 2013-12-11 HISTORY — DX: Chronic kidney disease, stage 3 unspecified: N18.30

## 2013-12-11 HISTORY — DX: Long term (current) use of anticoagulants: Z79.01

## 2013-12-11 HISTORY — DX: Chronic kidney disease, stage 3 (moderate): N18.3

## 2013-12-11 LAB — BASIC METABOLIC PANEL
ANION GAP: 14 (ref 5–15)
BUN: 25 mg/dL — AB (ref 6–23)
CALCIUM: 9.4 mg/dL (ref 8.4–10.5)
CO2: 23 mEq/L (ref 19–32)
CREATININE: 1.44 mg/dL — AB (ref 0.50–1.35)
Chloride: 96 mEq/L (ref 96–112)
GFR calc Af Amer: 50 mL/min — ABNORMAL LOW (ref >90)
GFR, EST NON AFRICAN AMERICAN: 43 mL/min — AB (ref >90)
Glucose, Bld: 158 mg/dL — ABNORMAL HIGH (ref 70–99)
Potassium: 4.6 mEq/L (ref 3.7–5.3)
Sodium: 133 mEq/L — ABNORMAL LOW (ref 137–147)

## 2013-12-11 LAB — CBC WITH DIFFERENTIAL/PLATELET
Basophils Absolute: 0 10*3/uL (ref 0.0–0.1)
Basophils Relative: 0 % (ref 0–1)
EOS ABS: 0.1 10*3/uL (ref 0.0–0.7)
Eosinophils Relative: 1 % (ref 0–5)
HEMATOCRIT: 38.8 % — AB (ref 39.0–52.0)
Hemoglobin: 12.9 g/dL — ABNORMAL LOW (ref 13.0–17.0)
Lymphocytes Relative: 15 % (ref 12–46)
Lymphs Abs: 1.1 10*3/uL (ref 0.7–4.0)
MCH: 33.1 pg (ref 26.0–34.0)
MCHC: 33.2 g/dL (ref 30.0–36.0)
MCV: 99.5 fL (ref 78.0–100.0)
MONO ABS: 0.5 10*3/uL (ref 0.1–1.0)
MONOS PCT: 7 % (ref 3–12)
Neutro Abs: 5.3 10*3/uL (ref 1.7–7.7)
Neutrophils Relative %: 76 % (ref 43–77)
Platelets: 106 10*3/uL — ABNORMAL LOW (ref 150–400)
RBC: 3.9 MIL/uL — ABNORMAL LOW (ref 4.22–5.81)
RDW: 13.6 % (ref 11.5–15.5)
WBC: 6.9 10*3/uL (ref 4.0–10.5)

## 2013-12-11 LAB — POCT INR: INR: 1.8

## 2013-12-11 MED ORDER — HEPARIN (PORCINE) IN NACL 100-0.45 UNIT/ML-% IJ SOLN
1100.0000 [IU]/h | INTRAMUSCULAR | Status: DC
  Administered 2013-12-11 – 2013-12-12 (×2): 1100 [IU]/h via INTRAVENOUS
  Filled 2013-12-11 (×2): qty 250

## 2013-12-11 MED ORDER — NITROGLYCERIN 0.4 MG SL SUBL: 0.4000 mg | SUBLINGUAL_TABLET | SUBLINGUAL | Status: DC | PRN

## 2013-12-11 MED ORDER — SODIUM CHLORIDE 0.9 % IV SOLN
250.0000 mL | INTRAVENOUS | Status: DC
  Administered 2013-12-11: 250 mL via INTRAVENOUS

## 2013-12-11 MED ORDER — SODIUM CHLORIDE 0.9 % IJ SOLN: 3.0000 mL | INTRAMUSCULAR | Status: DC | PRN

## 2013-12-11 MED ORDER — ONDANSETRON HCL 4 MG/2ML IJ SOLN: 4.0000 mg | Freq: Four times a day (QID) | INTRAMUSCULAR | Status: DC | PRN

## 2013-12-11 MED ORDER — MAGNESIUM OXIDE 400 (241.3 MG) MG PO TABS
200.0000 mg | ORAL_TABLET | Freq: Every day | ORAL | Status: DC
  Administered 2013-12-11 – 2013-12-12 (×2): 200 mg via ORAL
  Filled 2013-12-11 (×2): qty 0.5

## 2013-12-11 MED ORDER — DOFETILIDE 125 MCG PO CAPS
125.0000 ug | ORAL_CAPSULE | Freq: Two times a day (BID) | ORAL | Status: DC
  Administered 2013-12-11 – 2013-12-12 (×3): 125 ug via ORAL
  Filled 2013-12-11 (×3): qty 1

## 2013-12-11 MED ORDER — SIMVASTATIN 20 MG PO TABS
20.0000 mg | ORAL_TABLET | Freq: Every day | ORAL | Status: DC
  Administered 2013-12-11 – 2013-12-12 (×2): 20 mg via ORAL
  Filled 2013-12-11 (×2): qty 1

## 2013-12-11 MED ORDER — DICLOFENAC SODIUM 1 % TD GEL
4.0000 g | Freq: Two times a day (BID) | TRANSDERMAL | Status: DC | PRN
  Filled 2013-12-11: qty 100

## 2013-12-11 MED ORDER — CEPHALEXIN 500 MG PO CAPS
500.0000 mg | ORAL_CAPSULE | Freq: Three times a day (TID) | ORAL | Status: DC
  Administered 2013-12-11 – 2013-12-13 (×6): 500 mg via ORAL
  Filled 2013-12-11 (×6): qty 1

## 2013-12-11 MED ORDER — ONE-DAILY MULTI VITAMINS PO TABS: 1.0000 | ORAL_TABLET | Freq: Every day | ORAL | Status: DC

## 2013-12-11 MED ORDER — WARFARIN - PHARMACIST DOSING INPATIENT
Freq: Every day | Status: DC
  Administered 2013-12-11: 18:00:00

## 2013-12-11 MED ORDER — SODIUM CHLORIDE 0.9 % IV SOLN: 250.0000 mL | INTRAVENOUS | Status: DC | PRN

## 2013-12-11 MED ORDER — ADULT MULTIVITAMIN W/MINERALS CH
1.0000 | ORAL_TABLET | Freq: Every day | ORAL | Status: DC
  Administered 2013-12-12 – 2013-12-13 (×2): 1 via ORAL
  Filled 2013-12-11 (×3): qty 1

## 2013-12-11 MED ORDER — BENAZEPRIL HCL 20 MG PO TABS
20.0000 mg | ORAL_TABLET | Freq: Every day | ORAL | Status: DC
  Administered 2013-12-12 – 2013-12-13 (×2): 20 mg via ORAL
  Filled 2013-12-11 (×2): qty 1

## 2013-12-11 MED ORDER — FUROSEMIDE 10 MG/ML IJ SOLN
40.0000 mg | Freq: Once | INTRAMUSCULAR | Status: AC
  Administered 2013-12-11: 40 mg via INTRAVENOUS
  Filled 2013-12-11: qty 4

## 2013-12-11 MED ORDER — ACETAMINOPHEN 500 MG PO TABS: 500.0000 mg | ORAL_TABLET | Freq: Two times a day (BID) | ORAL | Status: DC | PRN

## 2013-12-11 MED ORDER — WARFARIN SODIUM 7.5 MG PO TABS
7.5000 mg | ORAL_TABLET | Freq: Once | ORAL | Status: AC
  Administered 2013-12-11: 7.5 mg via ORAL
  Filled 2013-12-11: qty 1

## 2013-12-11 MED ORDER — WARFARIN SODIUM 5 MG PO TABS: 5.0000 mg | ORAL_TABLET | Freq: Every day | ORAL | Status: DC

## 2013-12-11 MED ORDER — FUROSEMIDE 40 MG PO TABS
40.0000 mg | ORAL_TABLET | Freq: Every day | ORAL | Status: DC
Start: 2013-12-12 — End: 2013-12-12

## 2013-12-11 MED ORDER — SODIUM CHLORIDE 0.9 % IJ SOLN
3.0000 mL | Freq: Two times a day (BID) | INTRAMUSCULAR | Status: DC
  Administered 2013-12-11: 3 mL via INTRAVENOUS

## 2013-12-11 MED ORDER — MECLIZINE HCL 25 MG PO TABS: 12.5000 mg | ORAL_TABLET | Freq: Three times a day (TID) | ORAL | Status: DC | PRN

## 2013-12-11 MED ORDER — MAGNESIUM 250 MG PO TABS
1.0000 | ORAL_TABLET | Freq: Every evening | ORAL | Status: DC
Start: 2013-12-11 — End: 2013-12-11

## 2013-12-11 MED ORDER — SODIUM CHLORIDE 0.9 % IV SOLN
INTRAVENOUS | Status: DC
  Administered 2013-12-12: 05:00:00 via INTRAVENOUS

## 2013-12-11 MED ORDER — SODIUM CHLORIDE 0.9 % IJ SOLN: 3.0000 mL | Freq: Two times a day (BID) | INTRAMUSCULAR | Status: DC

## 2013-12-11 NOTE — H&P (Signed)
History and Physical   Date:  12/11/2013   ID:  [name], DOB March 20, 1926, MRN [id number]  PCP:  Sonda PrimesAlex Plotnikov, MD  Cardiologist/Electrophysiologist:  Dr. Sherryl MangesSteven Klein     History of Present Illness: [name] is a 80 y.o. male with a hx of CAD status post CABG in 1991, ischemic cardiomyopathy, status post AICD, Systolic CHF, AFlutter s/p RFCA, atrial fibrillation controlled on Tikosyn, Coumadin anticoagulation therapy, AAA status post stent graft with endoleak [facility], CKD, HTN, HL, thrombocytopenia.  Last seen by Dr. Sherryl MangesSteven Klein in 08/2013.    He was seen by primary care recently with increased dyspnea.  He is referred back for further evaluation.  Patient notes increased fatigue and exhaustion since the summertime. He recently had an infected left prepatellar bursitis. This was drained and he was placed on Keflex. He feels that he has experienced increased dyspnea over the past 2 months. He describes 4 pillow orthopnea as well as PND and increased pedal edema. He describes NYHA 3b-4 symptoms. He denies chest discomfort. He denies syncope. He's had a nonproductive cough. He has also noted increased wheezing. He awoke one night with a strange sensation over his ICD. He questions whether or not he had a shock from his defibrillator.   Studies:  - LHC (7/05):  LM 40%, proximal LAD 100%, mid circumflex 100%, mid to distal RCA 30%, PDA 30-40%, SVG-OM patent, SVG-DX patent, SVG-RCA patent, LIMA-LAD atretic, EF 25%  - Echo (1/10):  EF 25%, moderate aortic stenosis (mean 17 mmHg), mild LAE   Recent Labs/Images:  Recent Labs  09/16/13 0719 12/09/13 1053  NA 137 135  K 4.7 4.7  BUN 26* 23  CREATININE 1.4 1.5  ALT 18  --   HGB 13.6 13.2  TSH 0.83 0.31*  LDLCALC 75  --   HDL 37.00*  --   PROBNP  --  653.0*     Dg Chest 2 View  12/09/2013    IMPRESSION: 1. No acute findings. 2. Left basilar pleural parenchymal scarring, as on 03/03/2008. 3. Suspect pleural  parenchymal scarring at the base of the right hemi thorax as well.   Electronically Signed   By: Leanna BattlesMelinda  Blietz M.D.   On: 12/09/2013 11:14     Wt Readings from Last 3 Encounters:  12/09/13 189 lb 4 oz (85.843 kg)  12/05/13 189 lb (85.73 kg)  11/26/13 188 lb 2 oz (85.333 kg)     Past Medical History  Diagnosis Date  . Atrial fibrillation     AFIB  . Ischemic cardiomyopathy     CABG in 1991; last catheter 2005 with patent vein graft to PD, diagonal, marginal/echo 2010 EF 25%.  . 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 Abdominal aneurysm without mention of rupture     s/p stent graft with endoleak requiring restenting-[facility]  . Chronic systolic heart failure     SYSTOLIC .Marland Kitchen.ACUTE ON CHRONIC  . Hypertension   . Peripheral vascular disease, unspecified   . Hemoptysis   . Acute prostatitis   . Labyrinthitis, unspecified   . Unspecified hearing loss   . Cervicalgia   . Stroke     Current Outpatient Prescriptions  Medication Sig Dispense Refill  . acetaminophen (TYLENOL) 500 MG tablet Take 500 mg by mouth 2 (two) times daily.       . benazepril (LOTENSIN) 20 MG tablet TAKE 1 TABLET BY MOUTH EVERY DAY  90 tablet  3  .  Calcium Carbonate-Vit D-Min 600-400 MG-UNIT TABS Take 1 tablet by mouth as needed.       . cephALEXin (KEFLEX) 500 MG capsule Take 1 capsule (500 mg total) by mouth 3 (three) times daily.  30 capsule  0  . Copper Gluconate 2 MG TABS Take 1 tablet by mouth daily.      . fish oil-omega-3 fatty acids 1000 MG capsule Take 1 g by mouth daily.        . Flaxseed, Linseed, (FLAXSEED OIL) 1200 MG CAPS Take 1 capsule by mouth daily.        . furosemide (LASIX) 40 MG tablet TAKE 1 TABLET EVERY DAY  90 tablet  2  . Magnesium 250 MG TABS Take 1 tablet by mouth every evening.       . meclizine (ANTIVERT) 12.5 MG tablet Take 1 tablet (12.5 mg total) by mouth 3 (three) times daily as needed for dizziness or nausea.  60 tablet  1  . Multiple  Vitamin (MULTIVITAMIN) tablet Take 1 tablet by mouth daily.       . nitroGLYCERIN (NITROSTAT) 0.4 MG SL tablet Place 0.4 mg under the tongue every 5 (five) minutes as needed. For chest pain      . simvastatin (ZOCOR) 40 MG tablet TAKE 1/2 TABL ET BY MOUTH EVERY DAY  45 tablet  2  . TIKOSYN 125 MCG capsule TAKE 1 CAPSULE BY MOUTH TWICE DAILY  180 capsule  2  . VOLTAREN 1 % GEL APPLY 4 GRAMS TO AFFECTED AREA 2 TIMES DAILY FOR PAIN  300 g  2  . warfarin (COUMADIN) 5 MG tablet 1 tablet daily, except take 1/2 tablet on Fridays or as directed by coumadin clinic  95 tablet  1   No current facility-administered medications for this visit.     Allergies:   Promethazine hcl   Social History:  The patient  reports that he quit smoking about 42 years ago. He does not have any smokeless tobacco history on file. He reports that he does not drink alcohol or use illicit drugs.   Family History:  The patient's family history includes Arthritis in his sister; Heart attack in his father; Heart disease in his brother and father; Stroke in his mother.   ROS:  Please see the history of present illness.       All other systems reviewed and negative.    PHYSICAL EXAM: VS:  BP 120/71  Pulse 95  Ht 6\' 1"  (1.854 m)  Wt 188 lb (85.276 kg)  BMI 24.81 kg/m2  SpO2 97% Well nourished, well developed, in no acute distress HEENT: normal Neck: +  JVD Cardiac:  normal S1, S2; irregularly irregular rhythm ; no murmur Lungs:  Decreased breath sounds bilaterally, no wheezing, rhonchi or rales Abd: soft, nontender, no hepatomegaly Ext: Trace to 1+ bilateral LE edemaRight>> left Skin: warm and dry Neuro:  CNs 2-12 intact, no focal abnormalities noted  EKG:  Atrial fibrillation, HR 95, nonspecific IVCD     Lab Results  Component Value Date   INR 1.8 12/11/2013   INR 2.4 11/19/2013   INR 2.5 11/05/2013   PROTIME 17.3 07/31/2008     ASSESSMENT AND PLAN:  1.  Acute on chronic systolic CHF (congestive heart  failure): Patient is volume overloaded in the setting of recurrent atrial fibrillation. He is quite symptomatic with his atrial fibrillation as well.  Initially, we planned to proceed with DCCV tomorrow to restore NSR. However, his INR today is subtherapeutic. The patient  tells me that his symptoms have progressively worsened over the last several days. He is not comfortable staying at home.  He has a sense of impending doom. Therefore, I have recommended admission to the hospital for IV diuresis, IV heparin and TEE guided cardioversion. I reviewed this with Dr. Elease HashimotoNahser (DOD) who agreed.   -  Give 1 dose of Lasix 40 mg IV tonight.   -  Resume usual oral dose of Lasix tomorrow.   -  Chek BMET in the morning. 2.  Paroxysmal atrial fibrillation:  Continue Tikosyn. As noted, he will be admitted to the hospital. Coumadin will be managed per pharmacy. Start IV heparin. Plan TEE guided cardioversion tomorrow.  He will need to remain on IV heparin until his INR is therapeutic. 3.  Coronary artery disease: No angina. He is not on aspirin as he is on Coumadin. Continue statin. 4.  Cardiomyopathy, ischemic:  Continue ACE inhibitor. He is not on beta blocker for unclear reasons. 5.  Automatic implantable cardioverter-defibrillator in situ:  Device was interrogated today. He has not had any recent  ICD therapies. 6.  Infected prepatellar bursa, left: Continue antibiotics. 7.  HLD (hyperlipidemia):  Continue statin.  Disposition:   [facility].   Signed, Brynda RimScott Weaver, PA-C, MHS 12/11/2013 2:14 PM    [facility] [address], [place], [place]  [address] Phone: [phone]; Fax: [phone]    Attending Note:   The patient was seen and examined.  Agree with assessment and plan as noted above.  Changes made to the above note as needed.  Pt's symptoms may be due to atrial fib.  He has had symptoms of CHF recently and is now back in Afib. He is  having significant dyspnea and does not think that he will do well at home and agrees to come in to the hospital   Will admit. His INR is slightly low.   Agree with IV heparin tonight and until his INR is theraputic.  Anticipate TEE / cardioversion tomorrow or Friday.   Vesta MixerPhilip J. Therman Hughlett, Montez HagemanJr., MD, [facility] 12/11/2013, 5:03 PM [address],  [address] Office [phone] Pager [phone]

## 2013-12-11 NOTE — Progress Notes (Signed)
Cardiology Office Note   Date:  12/11/2013   ID:  [name] BoyerJohn E Tavano, DOB Jun 05, 1926, MRN [id number]  PCP:  Sonda PrimesAlex Plotnikov, MD  Cardiologist/Electrophysiologist:  Dr. Sherryl MangesSteven Klein     History of Present Illness: [name] is a 80 y.o. male with a hx of CAD status post CABG in 1991, ischemic cardiomyopathy, status post AICD, Systolic CHF, AFlutter s/p RFCA, atrial fibrillation controlled on Tikosyn, Coumadin anticoagulation therapy, AAA status post stent graft with endoleak [facility], CKD, HTN, HL, thrombocytopenia.  Last seen by Dr. Sherryl MangesSteven Klein in 08/2013.    He was seen by primary care recently with increased dyspnea.  He is referred back for further evaluation.  Patient notes increased fatigue and exhaustion since the summertime. He recently had an infected left prepatellar bursitis. This was drained and he was placed on Keflex. He feels that he has experienced increased dyspnea over the past 2 months. He describes 4 pillow orthopnea as well as PND and increased pedal edema. He describes NYHA 3b-4 symptoms. He denies chest discomfort. He denies syncope. He's had a nonproductive cough. He has also noted increased wheezing. He awoke one night with a strange sensation over his ICD. He questions whether or not he had a shock from his defibrillator.   Studies:  - LHC (7/05):  LM 40%, proximal LAD 100%, mid circumflex 100%, mid to distal RCA 30%, PDA 30-40%, SVG-OM patent, SVG-DX patent, SVG-RCA patent, LIMA-LAD atretic, EF 25%  - Echo (1/10):  EF 25%, moderate aortic stenosis (mean 17 mmHg), mild LAE   Recent Labs/Images:  Recent Labs  09/16/13 0719 12/09/13 1053  NA 137 135  K 4.7 4.7  BUN 26* 23  CREATININE 1.4 1.5  ALT 18  --   HGB 13.6 13.2  TSH 0.83 0.31*  LDLCALC 75  --   HDL 37.00*  --   PROBNP  --  653.0*     Dg Chest 2 View  12/09/2013    IMPRESSION: 1. No acute findings. 2. Left basilar pleural parenchymal scarring, as on 03/03/2008. 3. Suspect pleural  parenchymal scarring at the base of the right hemi thorax as well.   Electronically Signed   By: Leanna BattlesMelinda  Blietz M.D.   On: 12/09/2013 11:14     Wt Readings from Last 3 Encounters:  12/09/13 189 lb 4 oz (85.843 kg)  12/05/13 189 lb (85.73 kg)  11/26/13 188 lb 2 oz (85.333 kg)     Past Medical History  Diagnosis Date  . Atrial fibrillation     AFIB  . Ischemic cardiomyopathy     CABG in 1991; last catheter 2005 with patent vein graft to PD, diagonal, marginal/echo 2010 EF 25%.  . 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 Abdominal aneurysm without mention of rupture     s/p stent graft with endoleak requiring restenting-[facility]  . Chronic systolic heart failure     SYSTOLIC .Marland Kitchen.ACUTE ON CHRONIC  . Hypertension   . Peripheral vascular disease, unspecified   . Hemoptysis   . Acute prostatitis   . Labyrinthitis, unspecified   . Unspecified hearing loss   . Cervicalgia   . Stroke     Current Outpatient Prescriptions  Medication Sig Dispense Refill  . acetaminophen (TYLENOL) 500 MG tablet Take 500 mg by mouth 2 (two) times daily.       . benazepril (LOTENSIN) 20 MG tablet TAKE 1 TABLET BY MOUTH EVERY DAY  90 tablet  3  .  Calcium Carbonate-Vit D-Min 600-400 MG-UNIT TABS Take 1 tablet by mouth as needed.       . cephALEXin (KEFLEX) 500 MG capsule Take 1 capsule (500 mg total) by mouth 3 (three) times daily.  30 capsule  0  . Copper Gluconate 2 MG TABS Take 1 tablet by mouth daily.      . fish oil-omega-3 fatty acids 1000 MG capsule Take 1 g by mouth daily.        . Flaxseed, Linseed, (FLAXSEED OIL) 1200 MG CAPS Take 1 capsule by mouth daily.        . furosemide (LASIX) 40 MG tablet TAKE 1 TABLET EVERY DAY  90 tablet  2  . Magnesium 250 MG TABS Take 1 tablet by mouth every evening.       . meclizine (ANTIVERT) 12.5 MG tablet Take 1 tablet (12.5 mg total) by mouth 3 (three) times daily as needed for dizziness or nausea.  60 tablet  1  . Multiple  Vitamin (MULTIVITAMIN) tablet Take 1 tablet by mouth daily.       . nitroGLYCERIN (NITROSTAT) 0.4 MG SL tablet Place 0.4 mg under the tongue every 5 (five) minutes as needed. For chest pain      . simvastatin (ZOCOR) 40 MG tablet TAKE 1/2 TABL ET BY MOUTH EVERY DAY  45 tablet  2  . TIKOSYN 125 MCG capsule TAKE 1 CAPSULE BY MOUTH TWICE DAILY  180 capsule  2  . VOLTAREN 1 % GEL APPLY 4 GRAMS TO AFFECTED AREA 2 TIMES DAILY FOR PAIN  300 g  2  . warfarin (COUMADIN) 5 MG tablet 1 tablet daily, except take 1/2 tablet on Fridays or as directed by coumadin clinic  95 tablet  1   No current facility-administered medications for this visit.     Allergies:   Promethazine hcl   Social History:  The patient  reports that he quit smoking about 42 years ago. He does not have any smokeless tobacco history on file. He reports that he does not drink alcohol or use illicit drugs.   Family History:  The patient's family history includes Arthritis in his sister; Heart attack in his father; Heart disease in his brother and father; Stroke in his mother.   ROS:  Please see the history of present illness.       All other systems reviewed and negative.    PHYSICAL EXAM: VS:  BP 120/71  Pulse 95  Ht 6\' 1"  (1.854 m)  Wt 188 lb (85.276 kg)  BMI 24.81 kg/m2  SpO2 97% Well nourished, well developed, in no acute distress HEENT: normal Neck: +  JVD Cardiac:  normal S1, S2; irregularly irregular rhythm ; no murmur Lungs:  Decreased breath sounds bilaterally, no wheezing, rhonchi or rales Abd: soft, nontender, no hepatomegaly Ext: Trace to 1+ bilateral LE edemaRight>> left Skin: warm and dry Neuro:  CNs 2-12 intact, no focal abnormalities noted  EKG:  Atrial fibrillation, HR 95, nonspecific IVCD     Lab Results  Component Value Date   INR 1.8 12/11/2013   INR 2.4 11/19/2013   INR 2.5 11/05/2013   PROTIME 17.3 07/31/2008     ASSESSMENT AND PLAN:  1.  Acute on chronic systolic CHF (congestive heart  failure): Patient is volume overloaded in the setting of recurrent atrial fibrillation. He is quite symptomatic with his atrial fibrillation as well.  Initially, we planned to proceed with DCCV tomorrow to restore NSR. However, his INR today is subtherapeutic. The patient  tells me that his symptoms have progressively worsened over the last several days. He is not comfortable staying at home.  He has a sense of impending doom. Therefore, I have recommended admission to the hospital for IV diuresis, IV heparin and TEE guided cardioversion. I reviewed this with Dr. Elease HashimotoNahser (DOD) who agreed.   -  Give 1 dose of Lasix 40 mg IV tonight.   -  Resume usual oral dose of Lasix tomorrow.   -  Chek BMET in the morning. 2.  Paroxysmal atrial fibrillation:  Continue Tikosyn. As noted, he will be admitted to the hospital. Coumadin will be managed per pharmacy. Start IV heparin. Plan TEE guided cardioversion tomorrow.  He will need to remain on IV heparin until his INR is therapeutic. 3.  Coronary artery disease: No angina. He is not on aspirin as he is on Coumadin. Continue statin. 4.  Cardiomyopathy, ischemic:  Continue ACE inhibitor. He is not on beta blocker for unclear reasons. 5.  Automatic implantable cardioverter-defibrillator in situ:  Device was interrogated today. He has not had any recent  ICD therapies. 6.  Infected prepatellar bursa, left: Continue antibiotics. 7.  HLD (hyperlipidemia):  Continue statin.  Disposition:   [facility].   Signed, Brynda RimScott Pellegrino Kennard, PA-C, MHS 12/11/2013 2:14 PM    [facility] [address], [place], [place]  [address] Phone: [phone]; Fax: [phone]

## 2013-12-11 NOTE — Patient Instructions (Signed)
Your are being [facility] today. Please report to Admitting

## 2013-12-11 NOTE — Progress Notes (Signed)
ANTICOAGULATION CONSULT NOTE - Initial Consult  Pharmacy Consult for heparin and warfarin Indication: atrial fibrillation  Allergies  Allergen Reactions  . Promethazine Hcl     REACTION: world spins..    Patient Measurements: Height: 6\' 1"  (185.4 cm) Weight: 188 lb (85.276 kg) IBW/kg (Calculated) : 79.9 Heparin Dosing Weight: 85kg  Vital Signs: Temp: 97.5 F (36.4 C) (10/28 1603) Temp Source: Axillary (10/28 1603) BP: 130/81 mmHg (10/28 1603) Pulse Rate: 95 (10/28 1436)  Labs:  Recent Labs  12/09/13 1053 12/11/13 1538  HGB 13.2  --   HCT 39.7  --   PLT 112.0*  --   INR  --  1.8  CREATININE 1.5  --     Estimated Creatinine Clearance: 40.7 ml/min (by C-G formula based on Cr of 1.5).   Medical History: Past Medical History  Diagnosis Date  . Atrial fibrillation     AFIB  . Ischemic cardiomyopathy     CABG in 1991; last catheter 2005 with patent vein graft to PD, diagonal, marginal/echo 2010 EF 25%.  . 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 Abdominal aneurysm without mention of rupture     s/p stent graft with endoleak requiring restenting-[facility]  . Chronic systolic heart failure     SYSTOLIC .Marland Kitchen.ACUTE ON CHRONIC  . Hypertension   . Peripheral vascular disease, unspecified   . Hemoptysis   . Acute prostatitis   . Labyrinthitis, unspecified   . Unspecified hearing loss   . Cervicalgia   . Stroke     Assessment: 89 YOM with CAD s/p CABG in 1991 with AICD, AFlutter on Tikosyn and warfarin. He was seen in the Coumadin Clinic today and was sent to the hospital for DCCV as he is have AFib symptoms. INR at the clinic was low at 1.8- home dose is 5mg  daily except 2.5mg  on Fridays.  To start heparin with low INR in anticipation of DCCV this admission. Had a CBC done on the 10/26--Hgb 13.2, plts were low at 112. No overt bleeding noted.  Goal of Therapy:  INR 2-3 Heparin level 0.3-0.7 units/ml Monitor platelets by  anticoagulation protocol: Yes   Plan:  1. Start heparin at 1100 units/hr- NO BOLUS given high INR and low platelets 2. Warfarin 7.5mg  po x1 tonight  3. Heparin level in 8 hours  4. Daily HL, INR and CBC 5. Follow for s/s bleeding  Sorren Vallier D. Koren Sermersheim, PharmD, BCPS Clinical Pharmacist Pager: [phone] 12/11/2013 4:45 PM

## 2013-12-12 ENCOUNTER — Encounter (HOSPITAL_COMMUNITY): Payer: Medicare Other | Admitting: Certified Registered Nurse Anesthetist

## 2013-12-12 ENCOUNTER — Encounter (HOSPITAL_COMMUNITY): Payer: Self-pay | Admitting: Certified Registered Nurse Anesthetist

## 2013-12-12 ENCOUNTER — Observation Stay (HOSPITAL_COMMUNITY): Payer: Medicare Other | Admitting: Certified Registered Nurse Anesthetist

## 2013-12-12 ENCOUNTER — Telehealth: Payer: Self-pay | Admitting: Internal Medicine

## 2013-12-12 ENCOUNTER — Encounter (HOSPITAL_COMMUNITY)
Admission: AD | Disposition: A | Discharge status: Home / Self Care | Payer: Self-pay | Source: Ambulatory Visit | Attending: Cardiovascular Disease

## 2013-12-12 DIAGNOSIS — M7042 Prepatellar bursitis, left knee: Secondary | ICD-10-CM | POA: Diagnosis present

## 2013-12-12 DIAGNOSIS — Z8249 Family history of ischemic heart disease and other diseases of the circulatory system: Secondary | ICD-10-CM | POA: Diagnosis not present

## 2013-12-12 DIAGNOSIS — Z8673 Personal history of transient ischemic attack (TIA), and cerebral infarction without residual deficits: Secondary | ICD-10-CM | POA: Diagnosis not present

## 2013-12-12 DIAGNOSIS — Z79899 Other long term (current) drug therapy: Secondary | ICD-10-CM | POA: Diagnosis not present

## 2013-12-12 DIAGNOSIS — R0602 Shortness of breath: Secondary | ICD-10-CM | POA: Diagnosis present

## 2013-12-12 DIAGNOSIS — E785 Hyperlipidemia, unspecified: Secondary | ICD-10-CM | POA: Diagnosis present

## 2013-12-12 DIAGNOSIS — I2510 Atherosclerotic heart disease of native coronary artery without angina pectoris: Secondary | ICD-10-CM | POA: Diagnosis present

## 2013-12-12 DIAGNOSIS — I255 Ischemic cardiomyopathy: Secondary | ICD-10-CM | POA: Diagnosis present

## 2013-12-12 DIAGNOSIS — D696 Thrombocytopenia, unspecified: Secondary | ICD-10-CM | POA: Diagnosis present

## 2013-12-12 DIAGNOSIS — I351 Nonrheumatic aortic (valve) insufficiency: Secondary | ICD-10-CM

## 2013-12-12 DIAGNOSIS — I714 Abdominal aortic aneurysm, without rupture: Secondary | ICD-10-CM | POA: Diagnosis present

## 2013-12-12 DIAGNOSIS — Z7901 Long term (current) use of anticoagulants: Secondary | ICD-10-CM | POA: Diagnosis not present

## 2013-12-12 DIAGNOSIS — I129 Hypertensive chronic kidney disease with stage 1 through stage 4 chronic kidney disease, or unspecified chronic kidney disease: Secondary | ICD-10-CM | POA: Diagnosis present

## 2013-12-12 DIAGNOSIS — Z823 Family history of stroke: Secondary | ICD-10-CM | POA: Diagnosis not present

## 2013-12-12 DIAGNOSIS — Z87891 Personal history of nicotine dependence: Secondary | ICD-10-CM | POA: Diagnosis not present

## 2013-12-12 DIAGNOSIS — I5023 Acute on chronic systolic (congestive) heart failure: Secondary | ICD-10-CM | POA: Diagnosis present

## 2013-12-12 DIAGNOSIS — I4892 Unspecified atrial flutter: Secondary | ICD-10-CM | POA: Diagnosis present

## 2013-12-12 DIAGNOSIS — I739 Peripheral vascular disease, unspecified: Secondary | ICD-10-CM | POA: Diagnosis present

## 2013-12-12 DIAGNOSIS — N183 Chronic kidney disease, stage 3 (moderate): Secondary | ICD-10-CM | POA: Diagnosis present

## 2013-12-12 DIAGNOSIS — Z95810 Presence of automatic (implantable) cardiac defibrillator: Secondary | ICD-10-CM | POA: Diagnosis not present

## 2013-12-12 DIAGNOSIS — Z8261 Family history of arthritis: Secondary | ICD-10-CM | POA: Diagnosis not present

## 2013-12-12 DIAGNOSIS — I480 Paroxysmal atrial fibrillation: Secondary | ICD-10-CM | POA: Diagnosis present

## 2013-12-12 DIAGNOSIS — I513 Intracardiac thrombosis, not elsewhere classified: Secondary | ICD-10-CM | POA: Diagnosis present

## 2013-12-12 DIAGNOSIS — Z951 Presence of aortocoronary bypass graft: Secondary | ICD-10-CM | POA: Diagnosis not present

## 2013-12-12 HISTORY — PX: TEE WITHOUT CARDIOVERSION: SHX5443

## 2013-12-12 LAB — BASIC METABOLIC PANEL
Anion gap: 15 (ref 5–15)
BUN: 29 mg/dL — ABNORMAL HIGH (ref 6–23)
CO2: 22 mEq/L (ref 19–32)
Calcium: 9.1 mg/dL (ref 8.4–10.5)
Chloride: 97 mEq/L (ref 96–112)
Creatinine, Ser: 1.44 mg/dL — ABNORMAL HIGH (ref 0.50–1.35)
GFR calc Af Amer: 50 mL/min — ABNORMAL LOW (ref >90)
GFR calc non Af Amer: 43 mL/min — ABNORMAL LOW (ref >90)
Glucose, Bld: 112 mg/dL — ABNORMAL HIGH (ref 70–99)
Potassium: 4.4 mEq/L (ref 3.7–5.3)
Sodium: 134 mEq/L — ABNORMAL LOW (ref 137–147)

## 2013-12-12 LAB — CBC
HCT: 37.7 % — ABNORMAL LOW (ref 39.0–52.0)
Hemoglobin: 12.6 g/dL — ABNORMAL LOW (ref 13.0–17.0)
MCH: 33.7 pg (ref 26.0–34.0)
MCHC: 33.4 g/dL (ref 30.0–36.0)
MCV: 100.8 fL — ABNORMAL HIGH (ref 78.0–100.0)
Platelets: 92 10*3/uL — ABNORMAL LOW (ref 150–400)
RBC: 3.74 MIL/uL — ABNORMAL LOW (ref 4.22–5.81)
RDW: 13.6 % (ref 11.5–15.5)
WBC: 5.6 10*3/uL (ref 4.0–10.5)

## 2013-12-12 LAB — HEPARIN LEVEL (UNFRACTIONATED)
HEPARIN UNFRACTIONATED: 0.49 [IU]/mL (ref 0.30–0.70)
Heparin Unfractionated: 0.32 IU/mL (ref 0.30–0.70)

## 2013-12-12 LAB — PROTIME-INR
INR: 1.7 — ABNORMAL HIGH (ref 0.00–1.49)
Prothrombin Time: 20.1 seconds — ABNORMAL HIGH (ref 11.6–15.2)

## 2013-12-12 SURGERY — ECHOCARDIOGRAM, TRANSESOPHAGEAL
Anesthesia: Monitor Anesthesia Care

## 2013-12-12 MED ORDER — PHENYLEPHRINE HCL 10 MG/ML IJ SOLN
INTRAMUSCULAR | Status: DC | PRN
  Administered 2013-12-12: 40 ug via INTRAVENOUS

## 2013-12-12 MED ORDER — BENAZEPRIL HCL 20 MG PO TABS: 20.0000 mg | ORAL_TABLET | Freq: Every day | ORAL | Status: DC

## 2013-12-12 MED ORDER — MIDAZOLAM HCL 2 MG/2ML IJ SOLN
INTRAMUSCULAR | Status: AC
  Filled 2013-12-12: qty 2

## 2013-12-12 MED ORDER — DICLOFENAC SODIUM 1 % TD GEL: 4.0000 g | Freq: Two times a day (BID) | TRANSDERMAL | Status: DC | PRN

## 2013-12-12 MED ORDER — PROPOFOL INFUSION 10 MG/ML OPTIME
INTRAVENOUS | Status: DC | PRN
  Administered 2013-12-12: 25 ug/kg/min via INTRAVENOUS

## 2013-12-12 MED ORDER — WARFARIN SODIUM 2.5 MG PO TABS: 2.5000 mg | ORAL_TABLET | Freq: Every evening | ORAL | Status: DC

## 2013-12-12 MED ORDER — WARFARIN SODIUM 7.5 MG PO TABS
7.5000 mg | ORAL_TABLET | Freq: Once | ORAL | Status: AC
  Administered 2013-12-12: 7.5 mg via ORAL
  Filled 2013-12-12: qty 1

## 2013-12-12 MED ORDER — ETOMIDATE 2 MG/ML IV SOLN
INTRAVENOUS | Status: DC | PRN
  Administered 2013-12-12: 4 mg via INTRAVENOUS

## 2013-12-12 MED ORDER — FUROSEMIDE 10 MG/ML IJ SOLN
40.0000 mg | Freq: Once | INTRAMUSCULAR | Status: AC
  Administered 2013-12-12: 40 mg via INTRAVENOUS
  Filled 2013-12-12: qty 4

## 2013-12-12 MED ORDER — BUTAMBEN-TETRACAINE-BENZOCAINE 2-2-14 % EX AERO
INHALATION_SPRAY | CUTANEOUS | Status: DC | PRN
  Administered 2013-12-12: 2 via TOPICAL

## 2013-12-12 MED ORDER — WARFARIN SODIUM 5 MG PO TABS: 5.0000 mg | ORAL_TABLET | ORAL | Status: DC

## 2013-12-12 MED ORDER — DOFETILIDE 125 MCG PO CAPS: 125.0000 ug | ORAL_CAPSULE | Freq: Two times a day (BID) | ORAL | Status: DC

## 2013-12-12 MED ORDER — WARFARIN - PHARMACIST DOSING INPATIENT
Freq: Every day | Status: DC
Start: 2013-12-13 — End: 2013-12-13

## 2013-12-12 MED ORDER — FUROSEMIDE 40 MG PO TABS
40.0000 mg | ORAL_TABLET | ORAL | Status: DC
  Administered 2013-12-13: 40 mg via ORAL
  Filled 2013-12-12 (×2): qty 1

## 2013-12-12 MED ORDER — SIMVASTATIN 20 MG PO TABS: 20.0000 mg | ORAL_TABLET | Freq: Every day | ORAL | Status: DC

## 2013-12-12 MED ORDER — EPHEDRINE SULFATE 50 MG/ML IJ SOLN
INTRAMUSCULAR | Status: DC | PRN
  Administered 2013-12-12 (×2): 10 mg via INTRAVENOUS

## 2013-12-12 MED ORDER — MIDAZOLAM HCL 5 MG/5ML IJ SOLN
INTRAMUSCULAR | Status: DC | PRN
  Administered 2013-12-12: 1 mg via INTRAVENOUS

## 2013-12-12 MED ORDER — LACTATED RINGERS IV SOLN
INTRAVENOUS | Status: DC | PRN
  Administered 2013-12-12: 12:00:00 via INTRAVENOUS

## 2013-12-12 MED ORDER — SODIUM CHLORIDE 0.9 % IV SOLN: INTRAVENOUS | Status: DC

## 2013-12-12 MED ORDER — FUROSEMIDE 40 MG PO TABS: 40.0000 mg | ORAL_TABLET | ORAL | Status: DC

## 2013-12-12 MED ORDER — WARFARIN - PHYSICIAN DOSING INPATIENT: Freq: Every day | Status: DC

## 2013-12-12 MED ORDER — WARFARIN SODIUM 2.5 MG PO TABS: 2.5000 mg | ORAL_TABLET | ORAL | Status: DC

## 2013-12-12 MED ORDER — DOFETILIDE 125 MCG PO CAPS
125.0000 ug | ORAL_CAPSULE | Freq: Two times a day (BID) | ORAL | Status: DC
  Administered 2013-12-13: 125 ug via ORAL
  Filled 2013-12-12: qty 1

## 2013-12-12 NOTE — H&P (View-Only) (Signed)
Patient Profile: 80 y.o. male with a hx of CAD status post CABG in 1991, ischemic cardiomyopathy w/ EF of 25%, status post AICD, Systolic CHF, AFlutter s/p RFCA, atrial fibrillation on Tikosyn, Coumadin anticoagulation therapy, AAA status post stent graft with endoleak requiring [facility], CKD, HTN, HL, thrombocytopenia. Admitted 12/11/13 for A/C systolic CHF in the setting of recurrent atrial fibrillation.    Initial plan for was DCCV but, due to subtherpeutic INR, he will require TEE guided DCCV.   Subjective: Feels better. Breathing improved but he is on 2.5L of supplemental O2 via [place]. No chest pain or palpitations.   Objective: Vital signs in last 24 hours: Temp:  [97.5 F (36.4 C)-98 F (36.7 C)] 98 F (36.7 C) (10/29 0522) Pulse Rate:  [78-110] 78 (10/29 0522) Resp:  [20-24] 20 (10/29 0522) BP: (115-130)/(71-81) 115/71 mmHg (10/29 0522) SpO2:  [96 %-100 %] 98 % (10/29 0522) Weight:  [188 lb (85.276 kg)] 188 lb (85.276 kg) (10/28 1618) Last BM Date: 12/11/13  Intake/Output from previous day: 10/28 0701 - 10/29 0700 In: 31 [I.V.:31] Out: 1300 [Urine:1300] Intake/Output this shift:    Medications Current Facility-Administered Medications  Medication Dose Route Frequency Provider Last Rate Last Dose  . 0.9 %  sodium chloride infusion  250 mL Intravenous PRN Beatrice LecherScott T Weaver, PA-C      . 0.9 %  sodium chloride infusion   Intravenous Continuous Beatrice LecherScott T Weaver, PA-C 20 mL/hr at 12/12/13 0505    . 0.9 %  sodium chloride infusion  250 mL Intravenous Continuous Beatrice LecherScott T Weaver, PA-C 1 mL/hr at 12/11/13 1816 250 mL at 12/11/13 1816  . acetaminophen (TYLENOL) tablet 500 mg  500 mg Oral BID PRN Beatrice LecherScott T Weaver, PA-C      . benazepril (LOTENSIN) tablet 20 mg  20 mg Oral Daily Scott T Weaver, PA-C      . cephALEXin (KEFLEX) capsule 500 mg  500 mg Oral TID Beatrice LecherScott T Weaver, PA-C   500 mg at 12/11/13 2131  . diclofenac sodium (VOLTAREN) 1 % transdermal gel 4 g  4 g Topical BID PRN Beatrice LecherScott  T Weaver, PA-C      . dofetilide (TIKOSYN) capsule 125 mcg  125 mcg Oral BID Beatrice LecherScott T Weaver, PA-C   125 mcg at 12/11/13 1952  . furosemide (LASIX) tablet 40 mg  40 mg Oral Daily Scott T Weaver, PA-C      . heparin ADULT infusion 100 units/mL (25000 units/250 mL)  1,100 Units/hr Intravenous Continuous Lauren Bajbus, RPH 11 mL/hr at 12/11/13 1811 1,100 Units/hr at 12/11/13 1811  . magnesium oxide (MAG-OX) tablet 200 mg  200 mg Oral q1800 Beatrice LecherScott T Weaver, PA-C   200 mg at 12/11/13 2206  . meclizine (ANTIVERT) tablet 12.5 mg  12.5 mg Oral TID PRN Beatrice LecherScott T Weaver, PA-C      . multivitamin with minerals tablet 1 tablet  1 tablet Oral Daily Scott T Weaver, PA-C      . nitroGLYCERIN (NITROSTAT) SL tablet 0.4 mg  0.4 mg Sublingual Q5 min PRN Beatrice LecherScott T Weaver, PA-C      . ondansetron (ZOFRAN) injection 4 mg  4 mg Intravenous Q6H PRN Beatrice LecherScott T Weaver, PA-C      . simvastatin (ZOCOR) tablet 20 mg  20 mg Oral q1800 Beatrice LecherScott T Weaver, PA-C   20 mg at 12/11/13 1758  . sodium chloride 0.9 % injection 3 mL  3 mL Intravenous Q12H Beatrice LecherScott T Weaver, PA-C   3 mL at 12/11/13 2131  .  sodium chloride 0.9 % injection 3 mL  3 mL Intravenous PRN Beatrice LecherScott T Weaver, PA-C      . Warfarin - Pharmacist Dosing Inpatient   Does not apply q1800 Lauren Bajbus, RPH        PE: General appearance: alert, cooperative and no distress Neck: + mild JVD Lungs: faint bibasilar rales R>L Heart: irregularly irregular rhythm Extremities: 1+ LEE on the left, trace on the right Pulses: 2+ and symmetric Skin: warm and dry Neurologic: Grossly normal  Lab Results:   Recent Labs  12/09/13 1053 12/11/13 1835 12/12/13 0029  WBC 7.9 6.9 5.6  HGB 13.2 12.9* 12.6*  HCT 39.7 38.8* 37.7*  PLT 112.0* 106* 92*   BMET  Recent Labs  12/09/13 1053 12/11/13 1835 12/12/13 0029  NA 135 133* 134*  K 4.7 4.6 4.4  CL 100 96 97  CO2 23 23 22   GLUCOSE 112* 158* 112*  BUN 23 25* 29*  CREATININE 1.5 1.44* 1.44*  CALCIUM 9.7 9.4 9.1   PT/INR  Recent  Labs  12/11/13 1538 12/12/13 0029  LABPROT  --  20.1*  INR 1.8 1.70*     Assessment/Plan   Active Problems:   Atrial fibrillation   Acute on chronic systolic CHF (congestive heart failure)  1. Acute on Chronic Systolic CHF: Pt received 1 dose of 40 mg IV Lasix last night and diuresed 1.2 L. Breathing improved but still with mild peripheral edema and faint rales at the bases. SCr is unchanged from admission at 1.44. Will given another dose of 40 IV this am. Can hopefully transition back to PO tomorrow. Continue low sodium diet and strict I/Os.   2. Atrial Fibrillation: remains in afib but rate is controlled in the 80s. Continue Tikosyn and plan for TEE guided DCCV. Will see if this can be arranged today. Continue warfarin with heparin bridge.   3. Subtherapuetic INR: INR today is 1.70. Continue IV heparin and warfarin dosing by pharmacy. Will need to maintain therapeutic INRs post cardioversion.     LOS: 1 day   Brittainy M. Delmer IslamSimmons, PA-C 12/12/2013 8:28 AM  I have personally seen and examined this patient with Robbie LisBrittainy Simmons, PA-C. I agree with the assessment and plan as outlined above. He is admitted with atrial fib with RVR and CHF. Diuresing well. As per plans of Dr. Elease HashimotoNahser yesterday, TEE guided DCCV arranged for this am with Dr. Delton SeeNelson. Pt is NPO. Continue current meds including IV Lasix today.   MCALHANY,CHRISTOPHER 12/12/2013 10:10 AM

## 2013-12-12 NOTE — Progress Notes (Signed)
Echocardiogram Echocardiogram Transesophageal has been performed.  Dorothey BasemanReel, Kelbi Renstrom M 12/12/2013, 1:28 PM

## 2013-12-12 NOTE — Anesthesia Postprocedure Evaluation (Signed)
  Anesthesia Post-op Note  Patient: [name] BoyerJohn E Maske  Procedure(s) Performed: Procedure(s): TRANSESOPHAGEAL ECHOCARDIOGRAM (TEE) (N/A)  Patient Location: Endoscopy Unit  Anesthesia Type:MAC  Level of Consciousness: awake, alert , oriented and patient cooperative  Airway and Oxygen Therapy: Patient Spontanous Breathing and Patient connected to nasal cannula oxygen  Post-op Pain: none  Post-op Assessment: Post-op Vital signs reviewed, Patient's Cardiovascular Status Stable, Respiratory Function Stable, Patent Airway, No signs of Nausea or vomiting and Pain level controlled  Post-op Vital Signs: Reviewed and stable  Last Vitals:  Filed Vitals:   12/12/13 1315  BP: 120/89  Pulse: 104  Temp:   Resp: 24    Complications: No apparent anesthesia complications

## 2013-12-12 NOTE — Telephone Encounter (Signed)
We do not have a DPR on file.   I cannot discuss his dad's care unless we have his permission.   I would recommend one of PA/NPs or MD in the hospital today to come by his room to review the test results. I will send a message to have someone come by. Tereso NewcomerScott Kamani Lewter, PA-C   12/12/2013 3:54 PM

## 2013-12-12 NOTE — Progress Notes (Signed)
ANTICOAGULATION CONSULT NOTE - Follow Up Consult  Pharmacy Consult for heparin Indication: atrial fibrillation  Labs:  Recent Labs  12/09/13 1053 12/11/13 1538 12/11/13 1835 12/12/13 0029  HGB 13.2  --  12.9* 12.6*  HCT 39.7  --  38.8* 37.7*  PLT 112.0*  --  106* 92*  INR  --  1.8  --   --   HEPARINUNFRC  --   --   --  0.32  CREATININE 1.5  --  1.44*  --     Assessment/Plan:  80yo male therapeutic on heparin with initial dosing for low INR; at low end of goal but suspect heparin will continue to accumulate. Will continue gtt at current rate and confirm stable with additional level.   [name], PharmD, BCPS  12/12/2013,1:06 AM

## 2013-12-12 NOTE — Telephone Encounter (Signed)
Unable to leave voicemail message.

## 2013-12-12 NOTE — Telephone Encounter (Signed)
New message          Pt is currently in the hospital / pt son would like to know more information about his father's health

## 2013-12-12 NOTE — Care Management Note (Signed)
    Page 1 of 1   12/13/2013     11:45:19 AM CARE MANAGEMENT NOTE 12/13/2013  Patient:  [name],[name]   Account Number:  [id number]  Date Initiated:  12/12/2013  Documentation initiated by:  GRAVES-BIGELOW,Averill Pons  Subjective/Objective Assessment:   Pt admitted for Acute on chronic systolic CHF and Afib. Per MD notes plan for Anticipate TEE / cardioversion.     Action/Plan:   CM will continue to monitor for disposition needs.   Anticipated DC Date:  12/14/2013   Anticipated DC Plan:  HOME W HOME HEALTH SERVICES      DC Planning Services  CM consult      Choice offered to / List presented to:  C-1 Patient   DME arranged  [facility]      DME agency  [facility]        Status of service:  Completed, signed off Medicare Important Message given?  YES (If response is "NO", the following Medicare IM given date fields will be blank) Date Medicare IM given:  12/13/2013 Medicare IM given by:  GRAVES-BIGELOW,Faria Casella Date Additional Medicare IM given:   Additional Medicare IM given by:    Discharge Disposition:  HOME/SELF CARE  Per UR Regulation:  Reviewed for med. necessity/level of care/duration of stay  If discussed at Long Length of Stay Meetings, dates discussed:    Comments:

## 2013-12-12 NOTE — Anesthesia Preprocedure Evaluation (Addendum)
Anesthesia Evaluation  Patient identified by MRN, date of birth, ID band Patient awake    Reviewed: Allergy & Precautions, H&P , NPO status , Patient's Chart, lab work & pertinent test results  History of Anesthesia Complications Negative for: history of anesthetic complications  Airway Mallampati: II  TM Distance: >3 FB Neck ROM: Full    Dental  (+) Teeth Intact, Caps, Dental Advisory Given   Pulmonary COPDformer smoker (quit 1973),  breath sounds clear to auscultation        Cardiovascular hypertension, Pt. on medications - angina+ CAD, + Peripheral Vascular Disease (s/p AAA stent graft) and +CHF + dysrhythmias Atrial Fibrillation + pacemaker + Cardiac Defibrillator (has never shocked pt) Rhythm:Irregular Rate:Normal  '10 ECHO: EF 25%, valves OK   Neuro/Psych CVA, No Residual Symptoms    GI/Hepatic negative GI ROS, Neg liver ROS,   Endo/Other  negative endocrine ROS  Renal/GU Renal InsufficiencyRenal disease (creat 1.44)     Musculoskeletal   Abdominal   Peds  Hematology  (+) Blood dyscrasia (coumadin, plt 92K), ,   Anesthesia Other Findings   Reproductive/Obstetrics                           Anesthesia Physical Anesthesia Plan  ASA: IV  Anesthesia Plan: MAC   Post-op Pain Management:    Induction: Intravenous  Airway Management Planned: Natural Airway and Nasal Cannula  Additional Equipment:   Intra-op Plan:   Post-operative Plan:   Informed Consent: I have reviewed the patients History and Physical, chart, labs and discussed the procedure including the risks, benefits and alternatives for the proposed anesthesia with the patient or authorized representative who has indicated his/her understanding and acceptance.   Dental advisory given  Plan Discussed with: CRNA and Surgeon  Anesthesia Plan Comments: (Plan routine monitors, MAC)        Anesthesia Quick  Evaluation

## 2013-12-12 NOTE — Interval H&P Note (Signed)
History and Physical Interval Note:  12/12/2013 11:04 AM  [name]  has presented today for surgery, with the diagnosis of a fib  The various methods of treatment have been discussed with the patient and family. After consideration of risks, benefits and other options for treatment, the patient has consented to  Procedure(s): TRANSESOPHAGEAL ECHOCARDIOGRAM (TEE) (N/A) CARDIOVERSION (N/A) as a surgical intervention .  The patient's history has been reviewed, patient examined, no change in status, stable for surgery.  I have reviewed the patient's chart and labs.  Questions were answered to the patient's satisfaction.     Lars MassonNELSON, [name]
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
## 2013-12-12 NOTE — Telephone Encounter (Signed)
Patient's son would like to speak with you, if at all possible, about father who is in the hospital. Had TEE today.

## 2013-12-12 NOTE — Progress Notes (Signed)
Patient Profile: 80 y.o. male with a hx of CAD status post CABG in 1991, ischemic cardiomyopathy w/ EF of 25%, status post AICD, Systolic CHF, AFlutter s/p RFCA, atrial fibrillation on Tikosyn, Coumadin anticoagulation therapy, AAA status post stent graft with endoleak requiring [facility]enting [facility], CKD, HTN, HL, thrombocytopenia. Admitted 12/11/13 for A/C systolic CHF in the setting of recurrent atrial fibrillation.    Initial plan for was DCCV but, due to subtherpeutic INR, he will require TEE guided DCCV.   Subjective: Feels better. Breathing improved but he is on 2.5L of supplemental O2 via [place]. No chest pain or palpitations.   Objective: Vital signs in last 24 hours: Temp:  [97.5 F (36.4 C)-98 F (36.7 C)] 98 F (36.7 C) (10/29 0522) Pulse Rate:  [78-110] 78 (10/29 0522) Resp:  [20-24] 20 (10/29 0522) BP: (115-130)/(71-81) 115/71 mmHg (10/29 0522) SpO2:  [96 %-100 %] 98 % (10/29 0522) Weight:  [188 lb (85.276 kg)] 188 lb (85.276 kg) (10/28 1618) Last BM Date: 12/11/13  Intake/Output from previous day: 10/28 0701 - 10/29 0700 In: 31 [I.V.:31] Out: 1300 [Urine:1300] Intake/Output this shift:    Medications Current Facility-Administered Medications  Medication Dose Route Frequency Provider Last Rate Last Dose  . 0.9 %  sodium chloride infusion  250 mL Intravenous PRN Beatrice LecherScott T Weaver, PA-C      . 0.9 %  sodium chloride infusion   Intravenous Continuous Beatrice LecherScott T Weaver, PA-C 20 mL/hr at 12/12/13 0505    . 0.9 %  sodium chloride infusion  250 mL Intravenous Continuous Beatrice LecherScott T Weaver, PA-C 1 mL/hr at 12/11/13 1816 250 mL at 12/11/13 1816  . acetaminophen (TYLENOL) tablet 500 mg  500 mg Oral BID PRN Beatrice LecherScott T Weaver, PA-C      . benazepril (LOTENSIN) tablet 20 mg  20 mg Oral Daily Scott T Weaver, PA-C      . cephALEXin (KEFLEX) capsule 500 mg  500 mg Oral TID Beatrice LecherScott T Weaver, PA-C   500 mg at 12/11/13 2131  . diclofenac sodium (VOLTAREN) 1 % transdermal gel 4 g  4 g Topical BID PRN Beatrice LecherScott  T Weaver, PA-C      . dofetilide (TIKOSYN) capsule 125 mcg  125 mcg Oral BID Beatrice LecherScott T Weaver, PA-C   125 mcg at 12/11/13 1952  . furosemide (LASIX) tablet 40 mg  40 mg Oral Daily Scott T Weaver, PA-C      . heparin ADULT infusion 100 units/mL (25000 units/250 mL)  1,100 Units/hr Intravenous Continuous Lauren Bajbus, RPH 11 mL/hr at 12/11/13 1811 1,100 Units/hr at 12/11/13 1811  . magnesium oxide (MAG-OX) tablet 200 mg  200 mg Oral q1800 Beatrice LecherScott T Weaver, PA-C   200 mg at 12/11/13 2206  . meclizine (ANTIVERT) tablet 12.5 mg  12.5 mg Oral TID PRN Beatrice LecherScott T Weaver, PA-C      . multivitamin with minerals tablet 1 tablet  1 tablet Oral Daily Scott T Weaver, PA-C      . nitroGLYCERIN (NITROSTAT) SL tablet 0.4 mg  0.4 mg Sublingual Q5 min PRN Beatrice LecherScott T Weaver, PA-C      . ondansetron (ZOFRAN) injection 4 mg  4 mg Intravenous Q6H PRN Beatrice LecherScott T Weaver, PA-C      . simvastatin (ZOCOR) tablet 20 mg  20 mg Oral q1800 Beatrice LecherScott T Weaver, PA-C   20 mg at 12/11/13 1758  . sodium chloride 0.9 % injection 3 mL  3 mL Intravenous Q12H Beatrice LecherScott T Weaver, PA-C   3 mL at 12/11/13 2131  .  sodium chloride 0.9 % injection 3 mL  3 mL Intravenous PRN Scott T Weaver, PA-C      . Warfarin - Pharmacist Dosing Inpatient   Does not apply q1800 Lauren Bajbus, RPH        PE: General appearance: alert, cooperative and no distress Neck: + mild JVD Lungs: faint bibasilar rales R>L Heart: irregularly irregular rhythm Extremities: 1+ LEE on the left, trace on the right Pulses: 2+ and symmetric Skin: warm and dry Neurologic: Grossly normal  Lab Results:   Recent Labs  12/09/13 1053 12/11/13 1835 12/12/13 0029  WBC 7.9 6.9 5.6  HGB 13.2 12.9* 12.6*  HCT 39.7 38.8* 37.7*  PLT 112.0* 106* 92*   BMET  Recent Labs  12/09/13 1053 12/11/13 1835 12/12/13 0029  NA 135 133* 134*  K 4.7 4.6 4.4  CL 100 96 97  CO2 23 23 22  GLUCOSE 112* 158* 112*  BUN 23 25* 29*  CREATININE 1.5 1.44* 1.44*  CALCIUM 9.7 9.4 9.1   PT/INR  Recent  Labs  12/11/13 1538 12/12/13 0029  LABPROT  --  20.1*  INR 1.8 1.70*     Assessment/Plan   Active Problems:   Atrial fibrillation   Acute on chronic systolic CHF (congestive heart failure)  1. Acute on Chronic Systolic CHF: Pt received 1 dose of 40 mg IV Lasix last night and diuresed 1.2 L. Breathing improved but still with mild peripheral edema and faint rales at the bases. SCr is unchanged from admission at 1.44. Will given another dose of 40 IV this am. Can hopefully transition back to PO tomorrow. Continue low sodium diet and strict I/Os.   2. Atrial Fibrillation: remains in afib but rate is controlled in the 80s. Continue Tikosyn and plan for TEE guided DCCV. Will see if this can be arranged today. Continue warfarin with heparin bridge.   3. Subtherapuetic INR: INR today is 1.70. Continue IV heparin and warfarin dosing by pharmacy. Will need to maintain therapeutic INRs post cardioversion.     LOS: 1 day   Brittainy M. Simmons, PA-C 12/12/2013 8:28 AM  I have personally seen and examined this patient with Brittainy Simmons, PA-C. I agree with the assessment and plan as outlined above. He is admitted with atrial fib with RVR and CHF. Diuresing well. As per plans of Dr. Nahser yesterday, TEE guided DCCV arranged for this am with Dr. Nelson. Pt is NPO. Continue current meds including IV Lasix today.   Dayvion Sans 12/12/2013 10:10 AM   

## 2013-12-12 NOTE — Progress Notes (Signed)
ANTICOAGULATION CONSULT NOTE - Follow Up Consult  Pharmacy Consult for Heparin and Coumadin Indication: atrial fibrillation  Allergies  Allergen Reactions  . Promethazine Hcl     REACTION: world spins..    Patient Measurements: Height: 6\' 1"  (185.4 cm) Weight: 188 lb (85.276 kg) IBW/kg (Calculated) : 79.9 Heparin Dosing Weight:   Vital Signs: Temp: 98 F (36.7 C) (10/29 0522) Temp Source: Oral (10/29 0522) BP: 115/71 mmHg (10/29 0522) Pulse Rate: 78 (10/29 0522)  Labs:  Recent Labs  12/09/13 1053 12/11/13 1538 12/11/13 1835 12/12/13 0029 12/12/13 0815  HGB 13.2  --  12.9* 12.6*  --   HCT 39.7  --  38.8* 37.7*  --   PLT 112.0*  --  106* 92*  --   LABPROT  --   --   --  20.1*  --   INR  --  1.8  --  1.70*  --   HEPARINUNFRC  --   --   --  0.32 0.49  CREATININE 1.5  --  1.44* 1.44*  --     Estimated Creatinine Clearance: 42.4 ml/min (by C-G formula based on Cr of 1.44).   Medications:  Scheduled:  . benazepril  20 mg Oral Daily  . cephALEXin  500 mg Oral TID  . dofetilide  125 mcg Oral BID  . [START ON 12/13/2013] furosemide  40 mg Oral Q24H  . magnesium oxide  200 mg Oral q1800  . multivitamin with minerals  1 tablet Oral Daily  . simvastatin  20 mg Oral q1800  . sodium chloride  3 mL Intravenous Q12H  . Warfarin - Pharmacist Dosing Inpatient   Does not apply q1800    Assessment: 80yo male with AFib, on Tikosyn and Coumadin at home.  Currently on heparin bridge with subtherapeutic INR on admit; plan for TEE guided DCCV.  Heparin level 0.49, INR 1.7, Hg stable and pltc 92.  Pt with chronic thrombocytopenia and baseline on admit 106.  No bleeding problems noted.  Goal of Therapy:  INR 2-3 Heparin level 0.3-0.7 units/ml Monitor platelets by anticoagulation protocol: Yes   Plan:  Continue heparin 1100 units/hr Repeat Coumadin 7.5mg  today F/U AM labs  Marisue HumbleKendra Kelii Chittum, PharmD Clinical Pharmacist [facility]

## 2013-12-12 NOTE — Transfer of Care (Signed)
Immediate Anesthesia Transfer of Care Note  Patient: [name] BoyerJohn E [name]  Procedure(s) Performed: Procedure(s): TRANSESOPHAGEAL ECHOCARDIOGRAM (TEE) (N/A)  Patient Location: Endoscopy Unit  Anesthesia Type:MAC  Level of Consciousness: awake, alert  and oriented  Airway & Oxygen Therapy: Patient Spontanous Breathing and Patient connected to nasal cannula oxygen  Post-op Assessment: Report given to PACU RN and Post -op Vital signs reviewed and stable  Post vital signs: Reviewed and stable  Complications: No apparent anesthesia complications

## 2013-12-12 NOTE — Telephone Encounter (Signed)
Spoke with patient's son about DPR. He was angry that this was not addressed at his visit yesterday. i apologized, and relayed Scott's message to him. States his sister is at the hospital with dad, and she is his POA, so someone can speak with her.

## 2013-12-13 ENCOUNTER — Ambulatory Visit: Payer: Medicare Other | Admitting: Family Medicine

## 2013-12-13 ENCOUNTER — Encounter (HOSPITAL_COMMUNITY): Payer: Self-pay | Admitting: Cardiology

## 2013-12-13 ENCOUNTER — Other Ambulatory Visit: Payer: Self-pay | Admitting: Physician Assistant

## 2013-12-13 DIAGNOSIS — I255 Ischemic cardiomyopathy: Secondary | ICD-10-CM

## 2013-12-13 DIAGNOSIS — N183 Chronic kidney disease, stage 3 unspecified: Secondary | ICD-10-CM | POA: Diagnosis present

## 2013-12-13 DIAGNOSIS — Z95810 Presence of automatic (implantable) cardiac defibrillator: Secondary | ICD-10-CM

## 2013-12-13 DIAGNOSIS — I5022 Chronic systolic (congestive) heart failure: Secondary | ICD-10-CM

## 2013-12-13 DIAGNOSIS — Z7901 Long term (current) use of anticoagulants: Secondary | ICD-10-CM

## 2013-12-13 DIAGNOSIS — I481 Persistent atrial fibrillation: Secondary | ICD-10-CM

## 2013-12-13 LAB — PROTIME-INR
INR: 1.98 — ABNORMAL HIGH (ref 0.00–1.49)
Prothrombin Time: 22.7 seconds — ABNORMAL HIGH (ref 11.6–15.2)

## 2013-12-13 LAB — BASIC METABOLIC PANEL
Anion gap: 11 (ref 5–15)
BUN: 28 mg/dL — ABNORMAL HIGH (ref 6–23)
CO2: 25 mEq/L (ref 19–32)
Calcium: 9.1 mg/dL (ref 8.4–10.5)
Chloride: 102 mEq/L (ref 96–112)
Creatinine, Ser: 1.42 mg/dL — ABNORMAL HIGH (ref 0.50–1.35)
GFR calc Af Amer: 50 mL/min — ABNORMAL LOW (ref >90)
GFR calc non Af Amer: 43 mL/min — ABNORMAL LOW (ref >90)
Glucose, Bld: 116 mg/dL — ABNORMAL HIGH (ref 70–99)
Potassium: 4.4 mEq/L (ref 3.7–5.3)
Sodium: 138 mEq/L (ref 137–147)

## 2013-12-13 LAB — CBC
HCT: 37.5 % — ABNORMAL LOW (ref 39.0–52.0)
Hemoglobin: 12.4 g/dL — ABNORMAL LOW (ref 13.0–17.0)
MCH: 33.3 pg (ref 26.0–34.0)
MCHC: 33.1 g/dL (ref 30.0–36.0)
MCV: 100.8 fL — ABNORMAL HIGH (ref 78.0–100.0)
Platelets: 87 10*3/uL — ABNORMAL LOW (ref 150–400)
RBC: 3.72 MIL/uL — ABNORMAL LOW (ref 4.22–5.81)
RDW: 13.8 % (ref 11.5–15.5)
WBC: 5.8 10*3/uL (ref 4.0–10.5)

## 2013-12-13 LAB — HEPARIN LEVEL (UNFRACTIONATED): Heparin Unfractionated: 0.42 IU/mL (ref 0.30–0.70)

## 2013-12-13 MED ORDER — WARFARIN SODIUM 2.5 MG PO TABS: 2.5000 mg | ORAL_TABLET | Freq: Once | ORAL | Status: DC

## 2013-12-13 MED ORDER — FUROSEMIDE 40 MG PO TABS: 40.0000 mg | ORAL_TABLET | Freq: Two times a day (BID) | ORAL | Status: DC

## 2013-12-13 MED ORDER — CARVEDILOL 6.25 MG PO TABS: 6.2500 mg | ORAL_TABLET | Freq: Two times a day (BID) | ORAL | Status: DC

## 2013-12-13 MED ORDER — CARVEDILOL 6.25 MG PO TABS: 6.2500 mg | ORAL_TABLET | Freq: Two times a day (BID) | ORAL | Status: AC
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
## 2013-12-13 NOTE — Discharge Summary (Signed)
Patient seen and examined and history reviewed. Agree with above findings and plan. See earlier rounding note.  Doralee Kocak [place], MDFACC 12/13/2013 2:24 PM

## 2013-12-13 NOTE — Progress Notes (Signed)
ANTICOAGULATION CONSULT NOTE - Follow Up Consult  Pharmacy Consult for Heparin and Coumadin Indication: atrial fibrillation  Allergies  Allergen Reactions  . Promethazine Hcl     REACTION: world spins..    Patient Measurements: Height: 6\' 1"  (185.4 cm) Weight: 190 lb (86.183 kg) IBW/kg (Calculated) : 79.9  Vital Signs: Temp: 97.7 F (36.5 C) (10/30 0500) BP: 108/67 mmHg (10/30 0500) Pulse Rate: 77 (10/30 0500)  Labs:  Recent Labs  12/11/13 1538  12/11/13 1835 12/12/13 0029 12/12/13 0815 12/13/13 0335  HGB  --   < > 12.9* 12.6*  --  12.4*  HCT  --   --  38.8* 37.7*  --  37.5*  PLT  --   --  106* 92*  --  87*  LABPROT  --   --   --  20.1*  --  22.7*  INR 1.8  --   --  1.70*  --  1.98*  HEPARINUNFRC  --   --   --  0.32 0.49 0.42  CREATININE  --   --  1.44* 1.44*  --  1.42*  < > = values in this interval not displayed.  Estimated Creatinine Clearance: 43 ml/min (by C-G formula based on Cr of 1.42).   Medications:  . heparin 1,100 Units/hr (12/12/13 2100)    Assessment: 80 yo male with AFib, on Tikosyn and Coumadin at home. TEE guided DCCV cancelled due to findings of thrombus. He is on IV heparin bridge for subtherapeutic INR. Heparin level is therapeutic at 0.42 on 1100 units/hr, INR is subtherapeutic at 1.98 but trending up with higher doses given. No bleeding noted, Hb stable, chronic thrombocytopenia.  Goal of Therapy:  INR 2-3 Heparin level 0.3-0.7 units/ml Monitor platelets by anticoagulation protocol: Yes   Plan:  Continue heparin drip at 1100 units/hr Coumadin 2.5 mg today per home dosing schedule Daily heparin level and CBC Consider d/c heparin tomorrow if INR >2  [facility]wards County HospitalJennifer , Pharm.D., BCPS Clinical Pharmacist Pager: [phone] 12/13/2013 8:48 AM
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
## 2013-12-13 NOTE — Progress Notes (Signed)
Designated Party Release form reviewed and completed with patient and son. All questions and concerns about the form addressed.

## 2013-12-13 NOTE — Progress Notes (Signed)
Pt. Discharged to home. All questions and concerns addressed and answered. IV removed intact upon removal. Pt alert, oriented and aware upon discharge.

## 2013-12-13 NOTE — Clinical Documentation Improvement (Signed)
Please specify diagnosis related to the below supporting documentation/ clinical indicators if appropriate.  Possible Diagnosis CKD Stage I - GFR >90, Cr <0.9  CKD Stage II - GFR 60-89, Cr 1.0-1.3  CKD Stage III - GFR 30-59, Cr 1.4-2.5  CKD Stage IV - GFR 15-29, Cr 2.5-4.5  CKD Stage V - GFR <15, Cr >4.5  Unable to Clinically Determine  Other     Supporting Information/ Clinical Indicators  Per H&P and MD progress notes:  Patient has CKD.   Patient's related labs during admission  Component     Latest Ref Rng 12/09/2013 12/11/2013 12/12/2013          12:29 AM  BUN     6 - 23 mg/dL 23 25 (H) 29 (H)  Creatinine     0.50 - 1.35 mg/dL 1.5 7.821.44 (H) 9.561.44 (H)   Component     Latest Ref Rng 12/13/2013          BUN     6 - 23 mg/dL 28 (H)  Creatinine     0.50 - 1.35 mg/dL 2.131.42 (H)   Component     Latest Ref Rng 12/11/2013 12/12/2013         12:29 AM  GFR calc non Af Amer     >90 mL/min 43 (L) 43 (L)   Component     Latest Ref Rng 12/12/2013 12/13/2013         8:15 AM   GFR calc non Af Amer     >90 mL/min  43 (L)    Thanks, Webb Silversmitharlene Marice Guidone RN, BSN, CCM Clinical Documentation Specialist Phone: [phone]

## 2013-12-16 ENCOUNTER — Encounter: Payer: Self-pay | Admitting: Internal Medicine

## 2013-12-16 ENCOUNTER — Ambulatory Visit (INDEPENDENT_AMBULATORY_CARE_PROVIDER_SITE_OTHER): Payer: Medicare Other | Admitting: Internal Medicine

## 2013-12-16 VITALS — BP 82/58 | HR 90 | Ht 73.0 in | Wt 189.2 lb

## 2013-12-16 DIAGNOSIS — I4819 Other persistent atrial fibrillation: Secondary | ICD-10-CM

## 2013-12-16 DIAGNOSIS — Z95810 Presence of automatic (implantable) cardiac defibrillator: Secondary | ICD-10-CM

## 2013-12-16 DIAGNOSIS — I5023 Acute on chronic systolic (congestive) heart failure: Secondary | ICD-10-CM

## 2013-12-16 DIAGNOSIS — Z01812 Encounter for preprocedural laboratory examination: Secondary | ICD-10-CM

## 2013-12-16 DIAGNOSIS — I255 Ischemic cardiomyopathy: Secondary | ICD-10-CM

## 2013-12-16 DIAGNOSIS — I2510 Atherosclerotic heart disease of native coronary artery without angina pectoris: Secondary | ICD-10-CM

## 2013-12-16 DIAGNOSIS — I481 Persistent atrial fibrillation: Secondary | ICD-10-CM

## 2013-12-16 DIAGNOSIS — I5022 Chronic systolic (congestive) heart failure: Secondary | ICD-10-CM

## 2013-12-16 LAB — MDC_IDC_ENUM_SESS_TYPE_INCLINIC
Battery Remaining Longevity: 126 mo
Battery Remaining Percentage: 100 %
Brady Statistic RV Percent Paced: 0 %
Date Time Interrogation Session: 20151102092716
HighPow Impedance: 46 Ohm
Implantable Pulse Generator Serial Number: 119203
Lead Channel Impedance Value: 464 Ohm
Lead Channel Pacing Threshold Amplitude: 0.9 V
Lead Channel Pacing Threshold Pulse Width: 0.4 ms
Lead Channel Sensing Intrinsic Amplitude: 12.6 mV
Lead Channel Setting Pacing Amplitude: 2.4 V
Lead Channel Setting Pacing Pulse Width: 0.4 ms
Lead Channel Setting Sensing Sensitivity: 0.4 mV
MDC IDC SET ZONE DETECTION INTERVAL: 250 ms
MDC IDC SET ZONE DETECTION INTERVAL: 375 ms
Zone Setting Detection Interval: 300 ms

## 2013-12-16 MED ORDER — DIGOXIN 0.0625 MG HALF TABLET: 0.0625 mg | ORAL_TABLET | Freq: Every day | ORAL | Status: DC

## 2013-12-16 MED ORDER — APIXABAN 2.5 MG PO TABS: 2.5000 mg | ORAL_TABLET | Freq: Two times a day (BID) | ORAL | Status: AC

## 2013-12-16 MED ORDER — APIXABAN 2.5 MG PO TABS: 2.5000 mg | ORAL_TABLET | Freq: Two times a day (BID) | ORAL | Status: DC
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
## 2013-12-16 NOTE — Patient Instructions (Addendum)
Your physician has recommended you make the following change in your medication:  1) STOP Coumadin 2) START Eliquis 2.5 mg twice a day 3) START Digoxin 0.0625 mg daily 4) DECREASE Benazepril to 10 mg daily  Your physician recommends that you return for lab work on 12/30/13 for Digoxin level, BMET, and pre-procedure labs  Your physician has recommended that you have a Cardioversion (DCCV) on 11/23. Electrical Cardioversion uses a jolt of electricity to your heart either through paddles or wired patches attached to your chest. This is a controlled, usually prescheduled, procedure. Defibrillation is done under light anesthesia in the hospital, and you usually go home the day of the procedure. This is done to get your heart back into a normal rhythm. You are not awake for the procedure.   Jaydee Ingman, RN will call you to arrange this procedure. [phone]  Your physician recommends that you schedule a follow-up appointment in: 6 weeks follow up with Dr. Graciela HusbandsKlein

## 2013-12-20 ENCOUNTER — Telehealth: Payer: Self-pay | Admitting: *Deleted

## 2013-12-20 ENCOUNTER — Encounter: Payer: Self-pay | Admitting: *Deleted

## 2013-12-20 NOTE — Telephone Encounter (Signed)
Called patient to arrange DCCV for 11/23. Pre procedure lab 11/16. Letter of instructions mailed to patient's home address. Patient verbalized understanding and agreeable to plan.

## 2013-12-20 NOTE — Telephone Encounter (Signed)
Called in prior authorization for Eliquis. Approved through 12/21/2014.

## 2013-12-23 ENCOUNTER — Encounter: Payer: Self-pay | Admitting: Internal Medicine

## 2013-12-30 ENCOUNTER — Other Ambulatory Visit (INDEPENDENT_AMBULATORY_CARE_PROVIDER_SITE_OTHER): Payer: Medicare Other | Admitting: *Deleted

## 2013-12-30 DIAGNOSIS — I481 Persistent atrial fibrillation: Secondary | ICD-10-CM

## 2013-12-30 DIAGNOSIS — I5022 Chronic systolic (congestive) heart failure: Secondary | ICD-10-CM

## 2013-12-30 DIAGNOSIS — I4819 Other persistent atrial fibrillation: Secondary | ICD-10-CM

## 2013-12-30 DIAGNOSIS — Z01812 Encounter for preprocedural laboratory examination: Secondary | ICD-10-CM

## 2013-12-30 LAB — CBC WITH DIFFERENTIAL/PLATELET
BASOS PCT: 0.5 % (ref 0.0–3.0)
Basophils Absolute: 0 10*3/uL (ref 0.0–0.1)
EOS ABS: 0.1 10*3/uL (ref 0.0–0.7)
Eosinophils Relative: 0.9 % (ref 0.0–5.0)
HCT: 38.9 % — ABNORMAL LOW (ref 39.0–52.0)
Hemoglobin: 12.9 g/dL — ABNORMAL LOW (ref 13.0–17.0)
LYMPHS PCT: 14.6 % (ref 12.0–46.0)
Lymphs Abs: 1 10*3/uL (ref 0.7–4.0)
MCHC: 33.3 g/dL (ref 30.0–36.0)
MCV: 100.7 fl — ABNORMAL HIGH (ref 78.0–100.0)
Monocytes Absolute: 0.7 10*3/uL (ref 0.1–1.0)
Monocytes Relative: 10.1 % (ref 3.0–12.0)
NEUTROS PCT: 73.9 % (ref 43.0–77.0)
Neutro Abs: 5.2 10*3/uL (ref 1.4–7.7)
Platelets: 85 10*3/uL — ABNORMAL LOW (ref 150.0–400.0)
RBC: 3.86 Mil/uL — AB (ref 4.22–5.81)
RDW: 14 % (ref 11.5–15.5)
WBC: 7 10*3/uL (ref 4.0–10.5)

## 2013-12-30 LAB — BASIC METABOLIC PANEL
BUN: 34 mg/dL — ABNORMAL HIGH (ref 6–23)
CALCIUM: 9.3 mg/dL (ref 8.4–10.5)
CO2: 27 mEq/L (ref 19–32)
Chloride: 101 mEq/L (ref 96–112)
Creatinine, Ser: 1.4 mg/dL (ref 0.4–1.5)
GFR: 49.48 mL/min — ABNORMAL LOW (ref >60.00)
Glucose, Bld: 110 mg/dL — ABNORMAL HIGH (ref 70–99)
Potassium: 4.5 mEq/L (ref 3.5–5.1)
Sodium: 139 mEq/L (ref 135–145)

## 2013-12-31 ENCOUNTER — Encounter: Payer: Self-pay | Admitting: Family Medicine

## 2013-12-31 ENCOUNTER — Ambulatory Visit (INDEPENDENT_AMBULATORY_CARE_PROVIDER_SITE_OTHER): Payer: Medicare Other | Admitting: Family Medicine

## 2013-12-31 VITALS — BP 108/70 | HR 68 | Wt 182.0 lb

## 2013-12-31 DIAGNOSIS — I2510 Atherosclerotic heart disease of native coronary artery without angina pectoris: Secondary | ICD-10-CM

## 2013-12-31 DIAGNOSIS — M71162 Other infective bursitis, left knee: Secondary | ICD-10-CM

## 2013-12-31 DIAGNOSIS — M7042 Prepatellar bursitis, left knee: Secondary | ICD-10-CM

## 2013-12-31 LAB — DIGOXIN LEVEL: DIGOXIN LVL: 0.8 ng/mL (ref 0.8–2.0)

## 2013-12-31 NOTE — Assessment & Plan Note (Signed)
Patient is doing well, with no signs of residual infection at this time. There is a concern the patient's antibiotics could've also contributed to patient's atrial fibrillation and I do not feel that extending the antibiotics would be a good idea at this time. Patient does not have any systemic findings at this time and patient on physical exam is significantly better.  Patient come back though and one more time in 3 weeks for further evaluation.

## 2013-12-31 NOTE — Progress Notes (Signed)
  Tawana ScaleZach Marck Mcclenny D.O. [facility] [address] [place], [place] [address] Phone: [phone] Subjective:       CC: Left knee swelling follow-up  BJY:NWGNFAOZHYHPI:Subjective Posey BoyerJohn E Stellmach is a 80 y.o. male coming in with complaint of left knee swelling. Patient was seen previously and did have a prepatellar bursa impaction. Patient did have an aspiration done. Patient has been on antibiotics. Unfortunately in the interim patient also had atrial fibrillation. Patient had to be off Coumadin for quite some time for procedure and is scheduled to have pain of the procedure next Monday. Patient is now on a different blood thinner. Patient states though that his knee is not giving him any difficulty. Patient denies any fevers or chills or any abnormal weight loss. Patient has finish the antibiotic and is feeling relatively well at this time. Patient states for the first 4 days he had some serosanguineous draining but since then no longer having any difficulty.     Past medical history, social, surgical and family history all reviewed in electronic medical record.   Review of Systems: No headache, visual changes, nausea, vomiting, diarrhea, constipation, dizziness, abdominal pain, skin rash, fevers, chills, night sweats, weight loss, swollen lymph nodes, body aches, joint swelling, muscle aches, chest pain, shortness of breath, mood changes.   Objective Blood pressure 108/70, weight 182 lb (82.555 kg).  General: No apparent distress alert and oriented x3 mood and affect normal, dressed appropriately.  HEENT: Pupils equal, extraocular movements intact  Respiratory: Patient's speak in full sentences and does not appear short of breath  Cardiovascular: No lower extremity edema, non tender, no erythema  Skin: Warm dry intact with no signs of infection or rash on extremities or on axial skeleton.  Abdomen: Soft nontender  Neuro: Cranial nerves II through XII are intact, neurovascularly intact in all  extremities with 2+ DTRs and 2+ pulses.  Lymph: No lymphadenopathy of posterior or anterior cervical chain or axillae bilaterally.  Gait normal with good balance and coordination.  MSK:  Non tender with full range of motion and good stability and symmetric strength and tone of shoulders, elbows, wrist, hip, and ankles bilaterally.  Knee: Left On inspection tpatient no swelling noted the prepatellar bursa. Patient still has some mild hardening and thickening of the prepatellar bursa. No signs of infection. Patient does have significant varicose veins in the surrounding area. Tender over the prepatellar bursa. ROM full in flexion and extension and lower leg rotation. Ligaments with solid consistent endpoints including ACL, PCL, LCL, MCL. Negative Mcmurray's, Apley's, and Thessalonian tests. Contralateral knee unremarkable     Impression and Recommendations:     This case required medical decision making of moderate complexity.

## 2013-12-31 NOTE — Patient Instructions (Addendum)
Good to see you I am sorry to hear about your heart.  You are in good hands Ice is your friend Watch for any puss or changes that would make you think it is infected See me again in 3 weeks to check in.

## 2014-01-05 MED ORDER — SODIUM CHLORIDE 0.9 % IV SOLN: INTRAVENOUS | Status: DC

## 2014-01-06 ENCOUNTER — Ambulatory Visit (HOSPITAL_COMMUNITY)
Admission: RE | Admit: 2014-01-06 | Discharge: 2014-01-06 | Disposition: A | Discharge status: Home / Self Care | Payer: Medicare Other | Source: Ambulatory Visit | Attending: Cardiology | Admitting: Cardiology

## 2014-01-06 ENCOUNTER — Encounter (HOSPITAL_COMMUNITY): Payer: Self-pay | Admitting: Certified Registered"

## 2014-01-06 ENCOUNTER — Ambulatory Visit (HOSPITAL_COMMUNITY): Payer: Medicare Other | Admitting: Certified Registered"

## 2014-01-06 ENCOUNTER — Encounter (HOSPITAL_COMMUNITY)
Admission: RE | Disposition: A | Discharge status: Home / Self Care | Payer: Self-pay | Source: Ambulatory Visit | Attending: Cardiology

## 2014-01-06 DIAGNOSIS — I714 Abdominal aortic aneurysm, without rupture: Secondary | ICD-10-CM | POA: Insufficient documentation

## 2014-01-06 DIAGNOSIS — I4819 Other persistent atrial fibrillation: Secondary | ICD-10-CM

## 2014-01-06 DIAGNOSIS — N183 Chronic kidney disease, stage 3 (moderate): Secondary | ICD-10-CM | POA: Insufficient documentation

## 2014-01-06 DIAGNOSIS — I255 Ischemic cardiomyopathy: Secondary | ICD-10-CM | POA: Insufficient documentation

## 2014-01-06 DIAGNOSIS — I739 Peripheral vascular disease, unspecified: Secondary | ICD-10-CM | POA: Diagnosis not present

## 2014-01-06 DIAGNOSIS — I959 Hypotension, unspecified: Secondary | ICD-10-CM | POA: Insufficient documentation

## 2014-01-06 DIAGNOSIS — Z95810 Presence of automatic (implantable) cardiac defibrillator: Secondary | ICD-10-CM | POA: Insufficient documentation

## 2014-01-06 DIAGNOSIS — Z7901 Long term (current) use of anticoagulants: Secondary | ICD-10-CM | POA: Diagnosis not present

## 2014-01-06 DIAGNOSIS — I4891 Unspecified atrial fibrillation: Secondary | ICD-10-CM | POA: Diagnosis present

## 2014-01-06 DIAGNOSIS — I5023 Acute on chronic systolic (congestive) heart failure: Secondary | ICD-10-CM | POA: Diagnosis not present

## 2014-01-06 DIAGNOSIS — I129 Hypertensive chronic kidney disease with stage 1 through stage 4 chronic kidney disease, or unspecified chronic kidney disease: Secondary | ICD-10-CM | POA: Insufficient documentation

## 2014-01-06 DIAGNOSIS — I5022 Chronic systolic (congestive) heart failure: Secondary | ICD-10-CM

## 2014-01-06 DIAGNOSIS — Z87891 Personal history of nicotine dependence: Secondary | ICD-10-CM | POA: Diagnosis not present

## 2014-01-06 DIAGNOSIS — Z01812 Encounter for preprocedural laboratory examination: Secondary | ICD-10-CM

## 2014-01-06 DIAGNOSIS — I2510 Atherosclerotic heart disease of native coronary artery without angina pectoris: Secondary | ICD-10-CM | POA: Insufficient documentation

## 2014-01-06 DIAGNOSIS — Z8673 Personal history of transient ischemic attack (TIA), and cerebral infarction without residual deficits: Secondary | ICD-10-CM | POA: Insufficient documentation

## 2014-01-06 DIAGNOSIS — Z951 Presence of aortocoronary bypass graft: Secondary | ICD-10-CM | POA: Insufficient documentation

## 2014-01-06 DIAGNOSIS — E785 Hyperlipidemia, unspecified: Secondary | ICD-10-CM | POA: Insufficient documentation

## 2014-01-06 HISTORY — PX: CARDIOVERSION: SHX1299

## 2014-01-06 LAB — POCT I-STAT 4, (NA,K, GLUC, HGB,HCT)
Glucose, Bld: 103 mg/dL — ABNORMAL HIGH (ref 70–99)
HCT: 39 % (ref 39.0–52.0)
Hemoglobin: 13.3 g/dL (ref 13.0–17.0)
Potassium: 4 mEq/L (ref 3.7–5.3)
SODIUM: 138 meq/L (ref 137–147)

## 2014-01-06 SURGERY — CARDIOVERSION
Anesthesia: Monitor Anesthesia Care

## 2014-01-06 MED ORDER — LIDOCAINE HCL (CARDIAC) 20 MG/ML IV SOLN
INTRAVENOUS | Status: DC | PRN
  Administered 2014-01-06: 40 mg via INTRAVENOUS

## 2014-01-06 MED ORDER — PROPOFOL 10 MG/ML IV BOLUS
INTRAVENOUS | Status: DC | PRN
  Administered 2014-01-06: 40 mg via INTRAVENOUS

## 2014-01-06 NOTE — Transfer of Care (Signed)
Immediate Anesthesia Transfer of Care Note  Patient: [name] BoyerJohn E [name]  Procedure(s) Performed: Procedure(s): CARDIOVERSION (N/A)  Patient Location: Endoscopy Unit  Anesthesia Type:General  Level of Consciousness: awake, alert  and oriented  Airway & Oxygen Therapy: Patient Spontanous Breathing  Post-op Assessment: Report given to PACU RN, Post -op Vital signs reviewed and stable and Patient moving all extremities X 4  Post vital signs: Reviewed and stable  Complications: No apparent anesthesia complications

## 2014-01-06 NOTE — Addendum Note (Signed)
Addendum  created 01/06/14 1254 by Laverle HobbyGregory Zedric Deroy, MD   Modules edited: Anesthesia Review and Sign Navigator Section, Clinical Notes, Problem List   Clinical Notes:  File: [id number]   Problem List:  Marked As Reviewed

## 2014-01-06 NOTE — Interval H&P Note (Signed)
History and Physical Interval Note:  01/06/2014 12:24 PM  [name]  has presented today for surgery, with the diagnosis of afib  The various methods of treatment have been discussed with the patient and family. After consideration of risks, benefits and other options for treatment, the patient has consented to  Procedure(s): CARDIOVERSION (N/A) as a surgical intervention .  The patient's history has been reviewed, patient examined, no change in status, stable for surgery.  I have reviewed the patient's chart and labs.  Questions were answered to the patient's satisfaction.    Patient has been taking Eliquis without missing a dose. I spoke with Dr Graciela HusbandsKlein this morning, he is ok with DCCV without TEE.   Dalton [employer]

## 2014-01-06 NOTE — H&P (View-Only) (Signed)
Patient Care Team: Tresa GarterAleksei Plotnikov V, MD as PCP - General (Internal Medicine) Duke SalviaSteven C Sydne Krahl, MD (Cardiology) Lamar Benesharles R Epes, MD (Ophthalmology) Suszanne FinchMark A Farber, MD (Vascular Surgery)   HPI  [name] is a 80 y.o. male Seen in followup for ischemic cardiomyopathy And status post CABG in 1991;  he is status post ICD implantation  .  Catheterization 2005 demonstrated patent vein grafts. Echo cardiogram 2010 demonstrated stable left ventricular function at 25%.    He was hospitalized last week in atrial fibrillation with a rapid rate. This emerged in the context of a cough, presumed cellulitis of his left leg/knee for which he is currently on antibiotics.  He underwent TEE where smoke and organized thrombus was identified and cardioversion was deferred. This is been undertaken because of subtherapeutic INRs.  He is now sleeping at home on oxygen. The asymmetric edema is improving in his left leg. He still remains very weak. Renal function labs were reviewed. His creatinine ranges from 1.4--1.5. He is caring for his wife with her ever increasing needs   Past Medical History  Diagnosis Date  . Atrial fibrillation     AFIB  . Ischemic cardiomyopathy     CABG in 1991; last catheter 2005 with patent vein graft to PD, diagonal, marginal/echo 2010 EF 25%.  . 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 Abdominal aneurysm without mention of rupture     s/p stent graft with endoleak requiring restenting-[facility]  . Chronic systolic heart failure     SYSTOLIC .Marland Kitchen.ACUTE ON CHRONIC  . Hypertension   . Peripheral vascular disease, unspecified   . Hemoptysis   . Acute prostatitis   . Labyrinthitis, unspecified   . Unspecified hearing loss   . Cervicalgia   . Stroke   . CKD (chronic kidney disease), stage III   . Chronic anticoagulation     Coumadin    Past Surgical History  Procedure Laterality Date  . Insert / replace / remove pacemaker   2006    AICD GUIDANT VITALITY..IN SITU  . Colectomy  07/15/96  . Colostomy  07/15/96    COLOSTOMY TAKEN DOWN 01/14/97  . Coronary artery bypass graft  03/22/89  . Cardiac catheterization  2001    ANGIOPLASTY W/STENT RCA  . Abdominal aortic aneurysm repair  05/1999    STENT, ENDOVASCULAR RELINING OF GRAFT 3/11  . Cardioversion    . Tee without cardioversion N/A 12/12/2013    Procedure: TRANSESOPHAGEAL ECHOCARDIOGRAM (TEE);  Surgeon: [name], MD;  [facility];  Service: Cardiovascular;  Laterality: N/A;    Current Outpatient Prescriptions  Medication Sig Dispense Refill  . acetaminophen (TYLENOL) 500 MG tablet Take 1,000 mg by mouth 2 (two) times daily.     . benazepril (LOTENSIN) 20 MG tablet Take 10 mg by mouth daily.    . carvedilol (COREG) 6.25 MG tablet Take 1 tablet (6.25 mg total) by mouth 2 (two) times daily with a meal. 60 tablet 11  . cephALEXin (KEFLEX) 500 MG capsule Take 1 capsule (500 mg total) by mouth 3 (three) times daily. 30 capsule 0  . Copper Gluconate 2 MG TABS Take 1 tablet by mouth as needed.     . diclofenac sodium (VOLTAREN) 1 % GEL Apply 4 g topically 2 (two) times daily as needed (pain).    Marland Kitchen. dofetilide (TIKOSYN) 125 MCG capsule Take 125 mcg by mouth 2 (two) times daily.    . fish oil-omega-3  fatty acids 1000 MG capsule Take 1 g by mouth daily.     . Flaxseed, Linseed, (FLAXSEED OIL) 1200 MG CAPS Take 1 capsule by mouth daily.      . furosemide (LASIX) 40 MG tablet Take 1 tablet (40 mg total) by mouth 2 (two) times daily. 60 tablet 11  . Magnesium 250 MG TABS Take 1 tablet by mouth every evening.     . Multiple Vitamin (MULTIVITAMIN) tablet Take 1 tablet by mouth daily.     . nitroGLYCERIN (NITROSTAT) 0.4 MG SL tablet Place 0.4 mg under the tongue every 5 (five) minutes as needed for chest pain.     . Saline (SINUS WASH SALT NA) Place 1 Squirt into both nostrils at bedtime.    . simvastatin (ZOCOR) 40 MG tablet Take 20 mg by mouth daily.    Marland Kitchen.  warfarin (COUMADIN) 5 MG tablet Take 2.5-5 mg by mouth every evening. 5mg  everyday except Monday and Friday take 2.5mg      No current facility-administered medications for this visit.    Allergies  Allergen Reactions  . Promethazine Hcl Other (See Comments)    REACTION: world spins..    Review of Systems negative except from HPI and PMH  Physical Exam BP 82/58 mmHg  Pulse 90  Ht 6\' 1"  (1.854 m)  Wt 189 lb 3.2 oz (85.821 kg)  BMI 24.97 kg/m2 Well developed and well nourished in no acute distress HENT normal E scleral and icterus clear Neck Supple JVP 7-8 carotids brisk and full Clear to ausculation Device pocket well healed; without hematoma or erythema.  There is no tethering  Irregularly irregular rate and rhythm with a 2/6 systolic murmur Soft with active bowel sounds No clubbing cyanosis  Left greater than right 1-2+Edema Alert and oriented, grossly normal motor and sensory function Skin Warm and Dry    Assessment and  Plan  Ischemic Cardiomyopathy  Implantable Defib [employer] The patient's device was interrogated.  The information was reviewed. No changes were made in the programming.     congestive heart failure-systolic-acute on chronic  Atrial fibrillation-persistent  Hypotension  Left atrial clot  We will plan to transition his Coumadin to apixaban and we will use the 2.5 mg dose as his renal function is borderline and his age is [age]. Will anticipate cardioversion in 3 weeks.  Given his hypotension we will decrease his lisinopril from 20--10. He will continue on diuretics and carvedilol for rate control. We will also begin him on digoxin 0.0625 mg daily.  He will need a metabolic profile and a dig level in approximately 2 weeks time.

## 2014-01-06 NOTE — Anesthesia Preprocedure Evaluation (Addendum)
Anesthesia Evaluation  Patient identified by MRN, date of birth, ID band Patient awake    Airway Mallampati: I  TM Distance: >3 FB Neck ROM: Full    Dental  (+) Teeth Intact, Dental Advisory Given   Pulmonary former smoker,          Cardiovascular hypertension, + CAD, + Peripheral Vascular Disease and +CHF     Neuro/Psych CVA    GI/Hepatic   Endo/Other    Renal/GU Renal disease     Musculoskeletal   Abdominal   Peds  Hematology   Anesthesia Other Findings   Reproductive/Obstetrics                            Anesthesia Physical Anesthesia Plan  ASA: III  Anesthesia Plan: General   Post-op Pain Management:    Induction: Intravenous  Airway Management Planned: Mask  Additional Equipment:   Intra-op Plan:   Post-operative Plan:   Informed Consent: I have reviewed the patients History and Physical, chart, labs and discussed the procedure including the risks, benefits and alternatives for the proposed anesthesia with the patient or authorized representative who has indicated his/her understanding and acceptance.     Plan Discussed with: Anesthesiologist, CRNA and Surgeon  Anesthesia Plan Comments:        Anesthesia Quick Evaluation

## 2014-01-06 NOTE — Procedures (Signed)
Electrical Cardioversion Procedure Note Posey BoyerJohn E Beske [id number] 1926-12-08  Procedure: Electrical Cardioversion Indications:  Atrial Fibrillation.  Patient had LAA thrombus noted in 10/15.  He was switched to Eliquis and has been taking this for > 3 weeks without missing a dose.  Discussed with Dr Graciela HusbandsKlein this morning, planned DCCV without repeat TEE.   Procedure Details Consent: Risks of procedure as well as the alternatives and risks of each were explained to the (patient/caregiver).  Consent for procedure obtained. Time Out: Verified patient identification, verified procedure, site/side was marked, verified correct patient position, special equipment/implants available, medications/allergies/relevent history reviewed, required imaging and test results available.  Performed  Patient placed on cardiac monitor, pulse oximetry, supplemental oxygen as necessary.  Sedation given: Propofol per anesthesiology Pacer pads placed anterior and posterior chest.  Cardioverted 1 time(s).  Cardioverted at 200J.  Evaluation Findings: Post procedure EKG shows: NSR Complications: None Patient did tolerate procedure well.   Marca AnconaDalton Angelika Jerrett 01/06/2014, 12:34 PM

## 2014-01-06 NOTE — Anesthesia Postprocedure Evaluation (Signed)
  Anesthesia Post-op Note  Patient: [name] BoyerJohn E [name]  Procedure(s) Performed: Procedure(s): CARDIOVERSION (N/A)  Patient Location: PACU and Endoscopy Unit  Anesthesia Type:General  Level of Consciousness: awake, alert , oriented and patient cooperative  Airway and Oxygen Therapy: Patient Spontanous Breathing  Post-op Pain: none  Post-op Assessment: Post-op Vital signs reviewed, Patient's Cardiovascular Status Stable, Respiratory Function Stable, Patent Airway, No signs of Nausea or vomiting and Pain level controlled  Post-op Vital Signs: stable  Last Vitals:  Filed Vitals:   01/06/14 1238  BP: 102/45  Pulse: 62  Temp: 35.7 C  Resp: 22    Complications: No apparent anesthesia complications

## 2014-01-06 NOTE — Discharge Instructions (Addendum)

## 2014-01-07 ENCOUNTER — Encounter (HOSPITAL_COMMUNITY): Payer: Self-pay | Admitting: Cardiology

## 2014-01-21 ENCOUNTER — Encounter: Payer: Self-pay | Admitting: Internal Medicine

## 2014-01-21 ENCOUNTER — Ambulatory Visit (INDEPENDENT_AMBULATORY_CARE_PROVIDER_SITE_OTHER): Payer: Medicare Other | Admitting: Internal Medicine

## 2014-01-21 ENCOUNTER — Other Ambulatory Visit (INDEPENDENT_AMBULATORY_CARE_PROVIDER_SITE_OTHER): Payer: Medicare Other

## 2014-01-21 VITALS — BP 110/58 | HR 63 | Temp 97.8°F | Resp 16 | Ht 73.0 in | Wt 180.0 lb

## 2014-01-21 DIAGNOSIS — R0683 Snoring: Secondary | ICD-10-CM

## 2014-01-21 DIAGNOSIS — I4891 Unspecified atrial fibrillation: Secondary | ICD-10-CM

## 2014-01-21 DIAGNOSIS — I5022 Chronic systolic (congestive) heart failure: Secondary | ICD-10-CM

## 2014-01-21 DIAGNOSIS — M71162 Other infective bursitis, left knee: Secondary | ICD-10-CM

## 2014-01-21 DIAGNOSIS — N183 Chronic kidney disease, stage 3 unspecified: Secondary | ICD-10-CM

## 2014-01-21 DIAGNOSIS — M7042 Prepatellar bursitis, left knee: Secondary | ICD-10-CM

## 2014-01-21 DIAGNOSIS — Z7901 Long term (current) use of anticoagulants: Secondary | ICD-10-CM

## 2014-01-21 DIAGNOSIS — I2510 Atherosclerotic heart disease of native coronary artery without angina pectoris: Secondary | ICD-10-CM

## 2014-01-21 LAB — HEPATIC FUNCTION PANEL
ALK PHOS: 80 U/L (ref 39–117)
ALT: 29 U/L (ref 0–53)
AST: 27 U/L (ref 0–37)
Albumin: 3.8 g/dL (ref 3.5–5.2)
Bilirubin, Direct: 0.4 mg/dL — ABNORMAL HIGH (ref 0.0–0.3)
TOTAL PROTEIN: 7.4 g/dL (ref 6.0–8.3)
Total Bilirubin: 1.3 mg/dL — ABNORMAL HIGH (ref 0.2–1.2)

## 2014-01-21 LAB — URINALYSIS
BILIRUBIN URINE: NEGATIVE
KETONES UR: NEGATIVE
Leukocytes, UA: NEGATIVE
Nitrite: NEGATIVE
Specific Gravity, Urine: 1.01 (ref 1.000–1.030)
TOTAL PROTEIN, URINE-UPE24: NEGATIVE
URINE GLUCOSE: NEGATIVE
Urobilinogen, UA: 0.2 (ref 0.0–1.0)
pH: 6 (ref 5.0–8.0)

## 2014-01-21 LAB — BASIC METABOLIC PANEL
BUN: 31 mg/dL — AB (ref 6–23)
CHLORIDE: 101 meq/L (ref 96–112)
CO2: 29 meq/L (ref 19–32)
CREATININE: 1.6 mg/dL — AB (ref 0.4–1.5)
Calcium: 9.5 mg/dL (ref 8.4–10.5)
GFR: 43.18 mL/min — ABNORMAL LOW (ref >60.00)
GLUCOSE: 125 mg/dL — AB (ref 70–99)
POTASSIUM: 4.4 meq/L (ref 3.5–5.1)
Sodium: 138 mEq/L (ref 135–145)

## 2014-01-21 LAB — TSH: TSH: 0.53 u[IU]/mL (ref 0.35–4.50)

## 2014-01-21 LAB — MAGNESIUM: MAGNESIUM: 2.3 mg/dL (ref 1.5–2.5)

## 2014-01-21 NOTE — Assessment & Plan Note (Signed)
Monitoring

## 2014-01-21 NOTE — Assessment & Plan Note (Signed)
F/u w/Dr Katrinka BlazingSmith in 1 day

## 2014-01-21 NOTE — Assessment & Plan Note (Addendum)
01/06/14 Procedure: Electrical Cardioversion Indications: Atrial Fibrillation. Patient had LAA thrombus noted in 10/15. He was switched to Eliquis and has been taking this for > 3 weeks without missing a dose. Discussed with Dr Graciela HusbandsKlein this morning, planned DCCV without repeat TEE.   Continue with current prescription therapy as reflected on the Med list.

## 2014-01-21 NOTE — Patient Instructions (Signed)
Use oxygen at night

## 2014-01-21 NOTE — Progress Notes (Signed)
Subjective:   HPI  C/o fatigue after cardioversion. Med changes reviewed. Not using O2 - not wanting to get used to it C/o apnea at night - new  01/06/14 Procedure: Electrical Cardioversion Indications: Atrial Fibrillation. Patient had LAA thrombus noted in 10/15. He was switched to Eliquis and has been taking this for > 3 weeks without missing a dose. Discussed with Dr Graciela HusbandsKlein this morning, planned DCCV without repeat TEE.   F/u vertigo - resolved, fatigue - better  The patient presents for a follow-up of CHF, A fib, pacemaker  chronic hypertension, chronic dyslipidemia controlled with medicines  F/u  AAA - Dr Bennie PieriniMark Farber - a complex long hx - STENT and STENT repair in 2011    Review of Systems  Constitutional: Negative for appetite change, fatigue and unexpected weight change.  HENT: Positive for hearing loss. Negative for congestion, nosebleeds, sneezing, sore throat and trouble swallowing.   Eyes: Negative for itching and visual disturbance.  Respiratory: Negative for cough.   Cardiovascular: Negative for chest pain, palpitations and leg swelling.  Gastrointestinal: Negative for nausea, diarrhea, blood in stool and abdominal distention.  Genitourinary: Negative for frequency and hematuria.  Musculoskeletal: Negative for back pain, joint swelling, gait problem and neck pain.  Skin: Negative for rash.  Neurological: Negative for dizziness, tremors, speech difficulty and weakness.  Psychiatric/Behavioral: Negative for suicidal ideas, sleep disturbance, dysphoric mood and agitation. The patient is not nervous/anxious.        Objective:   Physical Exam  Constitutional: He is oriented to person, place, and time. He appears well-developed. No distress.  NAD  HENT:  Mouth/Throat: Oropharynx is clear and moist.  Eyes: Conjunctivae are normal. Pupils are equal, round, and reactive to light.  Neck: Normal range of motion. No JVD present. No thyromegaly present.    Cardiovascular: Normal rate, regular rhythm, normal heart sounds and intact distal pulses.  Exam reveals no gallop and no friction rub.   No murmur heard. Pulmonary/Chest: Effort normal and breath sounds normal. No respiratory distress. He has no wheezes. He has no rales. He exhibits no tenderness.  Abdominal: Soft. Bowel sounds are normal. He exhibits no distension and no mass. There is no tenderness. There is no rebound and no guarding.  Musculoskeletal: Normal range of motion. He exhibits no edema or tenderness.  Lymphadenopathy:    He has no cervical adenopathy.  Neurological: He is alert and oriented to person, place, and time. He has normal reflexes. No cranial nerve deficit. He exhibits normal muscle tone. He displays a negative Romberg sign. Coordination and gait normal.  Skin: Skin is warm and dry. No rash noted.  Psychiatric: He has a normal mood and affect. His behavior is normal. Judgment and thought content normal.  Looks tired L knee with prepatellar redness   Abd CT angio 01/12/09  IMPRESSION:  1. No definite endovascular leak is currently seen.  2. However, the native aneurysm sac has increased slightly in  size, now measuring 56 x 60 mm compared to 55 x 55 mm previously.  3. Incidental gallstones.  CTA PELVIS  Findings: The iliac limbs of the AAA stent opacify with no  significant abnormality noted. Both the internal and external  iliac arteries opacify. The urinary bladder is slightly thick-  walled. The prostate is moderately prominent. Postop changes from  prior left hemicolectomy are again noted.  Review of the MIP images confirms the above findings.  IMPRESSION:  1. Stable iliac limbs of AAA stent.  2. Probable some degree  of bladder outlet obstruction with  moderately enlarged prostate.     A complex case. Hospital records, procedures reviewed    Assessment & Plan:  Patient ID: [name], male   DOB: 02/11/1927, 80 y.o.   MRN: [id number]

## 2014-01-21 NOTE — Assessment & Plan Note (Signed)
2015 - Eliquis - cont Rx

## 2014-01-21 NOTE — Assessment & Plan Note (Addendum)
Continue with current prescription therapy as reflected on the Med list. Use O2 at night 2 l n/c

## 2014-01-21 NOTE — Progress Notes (Signed)
Pre visit review using our clinic review tool, if applicable. No additional management support is needed unless otherwise documented below in the visit note. 

## 2014-01-22 ENCOUNTER — Encounter (HOSPITAL_COMMUNITY): Payer: Self-pay | Admitting: Internal Medicine

## 2014-01-22 ENCOUNTER — Telehealth: Payer: Self-pay | Admitting: Internal Medicine

## 2014-01-22 LAB — DIGOXIN LEVEL: Digoxin Level: 0.6 ng/mL — ABNORMAL LOW (ref 0.8–2.0)

## 2014-01-22 MED ORDER — FUROSEMIDE 40 MG PO TABS: 40.0000 mg | ORAL_TABLET | Freq: Every day | ORAL | Status: DC

## 2014-01-22 NOTE — Telephone Encounter (Signed)
Advised to decrease Furosemide to 40 mg daily. Patient is to call back if no improvement or worsening symptoms. Patient verbalized understanding and agreeable to plan.

## 2014-01-22 NOTE — Telephone Encounter (Signed)
Discussed with patient. Explains he was taking 20mg  four times a week prior to hospitalization last month for chronic/acute HF & AFib RVR. Upon discharge from hospital he was increased to 40 mg BID. He states that in the last month he has gone from 186 lb down to 173 lb (total loss of 13 lb). He denies dizziness with low BP. He does not have a lot of energy since hospital. Explained that would review with Dr. Graciela HusbandsKlein and get back with him.   He is agreeable to this.

## 2014-01-22 NOTE — Telephone Encounter (Signed)
New message     Pt thinks he is being over medicated with his furosemide.  Having a hard time keeping himself hydrated and he is loosing weight.  Bp yesterday was 100/50 at his PCP's office  Please advise

## 2014-01-23 ENCOUNTER — Ambulatory Visit (INDEPENDENT_AMBULATORY_CARE_PROVIDER_SITE_OTHER): Payer: Medicare Other | Admitting: Family Medicine

## 2014-01-23 ENCOUNTER — Encounter: Payer: Self-pay | Admitting: Family Medicine

## 2014-01-23 VITALS — BP 98/60 | HR 63 | Ht 73.0 in | Wt 180.0 lb

## 2014-01-23 DIAGNOSIS — M7042 Prepatellar bursitis, left knee: Secondary | ICD-10-CM

## 2014-01-23 DIAGNOSIS — M71162 Other infective bursitis, left knee: Secondary | ICD-10-CM

## 2014-01-23 DIAGNOSIS — I2510 Atherosclerotic heart disease of native coronary artery without angina pectoris: Secondary | ICD-10-CM

## 2014-01-23 NOTE — Assessment & Plan Note (Signed)
There is no signs of infection at this time. Patient does have what appears to be an abrasion on the anterior aspect of the knee. Due to patient being on a blood thinner silver nitrate was applied. We discussed continuing with dressing on the knee. We discussed watching for any signs of infection. Patient is long as he does not notice any signs we discussed in great detail will follow-up on an as-needed basis.

## 2014-01-23 NOTE — Progress Notes (Signed)
  [name] ScaleZach Roslin [name] D.O. [facility] [address] [place], [place] [address] Phone: [phone] Subjective:     CC: Left knee swelling follow-up  BJY:NWGNFAOZHYHPI:Subjective [name] BoyerJohn E [name] is a 80 y.o. male coming in with complaint of left knee swelling. Patient was seen previously and did have a prepatellar bursa  infection. Patient did have aspiration and was given antibiotics. Patient unfortunately may have had a side effect to the antibiotics and went into atrial fibrillation needing a cardioversion. Patient states overall he has been feeling tired but denies any fevers or chills. Patient states that the knee has not been hurting him but unfortunately continues to have some mild bleeding coming from the knee. Patient is on a new blood thinner. Overall though feeling tired like I said but no signs of infection. Patient is just here for evaluation.     Past medical history, social, surgical and family history all reviewed in electronic medical record.   Review of Systems: No headache, visual changes, nausea, vomiting, diarrhea, constipation, dizziness, abdominal pain, skin rash, fevers, chills, night sweats, weight loss, swollen lymph nodes, body aches, joint swelling, muscle aches, chest pain, shortness of breath, mood changes.   Objective Blood pressure 98/60, pulse 63, height 6\' 1"  (1.854 m), weight 180 lb (81.647 kg), SpO2 99 %.  General: No apparent distress alert and oriented x3 mood and affect normal, dressed appropriately.  HEENT: Pupils equal, extraocular movements intact  Respiratory: Patient's speak in full sentences and does not appear short of breath  Cardiovascular: No lower extremity edema, non tender, no erythema  Skin: Warm dry intact with no signs of infection or rash on extremities or on axial skeleton.  Abdomen: Soft nontender  Neuro: Cranial nerves II through XII are intact, neurovascularly intact in all extremities with 2+ DTRs and 2+ pulses.  Lymph: No lymphadenopathy of  posterior or anterior cervical chain or axillae bilaterally.  Gait normal with good balance and coordination.  MSK:  Non tender with full range of motion and good stability and symmetric strength and tone of shoulders, elbows, wrist, hip, and ankles bilaterally.  Knee: Left On inspection tpatient no swelling noted the prepatellar bursa. Patient still has some mild hardening and thickening of the prepatellar bursa. No signs of infection. Does have area over the anterior aspect that does have an abrasion injury with good looking granulation tissue. ROM full in flexion and extension and lower leg rotation. Ligaments with solid consistent endpoints including ACL, PCL, LCL, MCL. Negative Mcmurray's, Apley's, and Thessalonian tests. Contralateral knee unremarkable     Impression and Recommendations:     This case required medical decision making of moderate complexity.

## 2014-01-23 NOTE — Patient Instructions (Addendum)
Good to see you No infection today.  Will stop the bleeding and do dressing changes daily See me again if any worsening redness, signs of infection or pus from the area.  Pad the area if putting knee down or wearing jeans.  Happy holidays!

## 2014-01-26 ENCOUNTER — Other Ambulatory Visit: Payer: Self-pay | Admitting: Internal Medicine

## 2014-01-27 ENCOUNTER — Telehealth: Payer: Self-pay | Admitting: Internal Medicine

## 2014-01-27 NOTE — Telephone Encounter (Signed)
Refill request for Coumadin received from pt's pharmacy, pt states he is not on Coumadin at present he is taking Eliquis x 3 weeks.  Denied rx refill for Coumadin to pt's pharmacy.  Pt aware.

## 2014-01-27 NOTE — Telephone Encounter (Signed)
New message      Pt states he is returning a call to the coumadin clinic.  He is has been on eliquis for 3wks.  Please call

## 2014-02-04 ENCOUNTER — Ambulatory Visit (INDEPENDENT_AMBULATORY_CARE_PROVIDER_SITE_OTHER): Payer: Medicare Other | Admitting: Internal Medicine

## 2014-02-04 ENCOUNTER — Encounter: Payer: Self-pay | Admitting: Internal Medicine

## 2014-02-04 ENCOUNTER — Encounter: Payer: Self-pay | Admitting: *Deleted

## 2014-02-04 VITALS — BP 110/64 | HR 77 | Ht 73.0 in | Wt 182.0 lb

## 2014-02-04 DIAGNOSIS — I255 Ischemic cardiomyopathy: Secondary | ICD-10-CM

## 2014-02-04 DIAGNOSIS — I480 Paroxysmal atrial fibrillation: Secondary | ICD-10-CM

## 2014-02-04 DIAGNOSIS — I5023 Acute on chronic systolic (congestive) heart failure: Secondary | ICD-10-CM

## 2014-02-04 DIAGNOSIS — I2510 Atherosclerotic heart disease of native coronary artery without angina pectoris: Secondary | ICD-10-CM

## 2014-02-04 DIAGNOSIS — Z01812 Encounter for preprocedural laboratory examination: Secondary | ICD-10-CM

## 2014-02-04 DIAGNOSIS — I481 Persistent atrial fibrillation: Secondary | ICD-10-CM

## 2014-02-04 DIAGNOSIS — I4819 Other persistent atrial fibrillation: Secondary | ICD-10-CM

## 2014-02-04 LAB — MDC_IDC_ENUM_SESS_TYPE_INCLINIC
Date Time Interrogation Session: 20151222050000
HighPow Impedance: 29 Ohm
HighPow Impedance: 48 Ohm
Lead Channel Pacing Threshold Amplitude: 0.9 V
Lead Channel Setting Pacing Amplitude: 2.4 V
MDC IDC MSMT LEADCHNL RV IMPEDANCE VALUE: 487 Ohm
MDC IDC MSMT LEADCHNL RV PACING THRESHOLD PULSEWIDTH: 0.4 ms
MDC IDC MSMT LEADCHNL RV SENSING INTR AMPL: 12.7 mV
MDC IDC PG SERIAL: 119203
MDC IDC SET LEADCHNL RV PACING PULSEWIDTH: 0.4 ms
MDC IDC SET LEADCHNL RV SENSING SENSITIVITY: 0.4 mV
MDC IDC SET ZONE DETECTION INTERVAL: 375 ms
Zone Setting Detection Interval: 250 ms
Zone Setting Detection Interval: 300 ms

## 2014-02-04 LAB — CBC WITH DIFFERENTIAL/PLATELET
BASOS ABS: 0 10*3/uL (ref 0.0–0.1)
Basophils Relative: 0.5 % (ref 0.0–3.0)
EOS ABS: 0.1 10*3/uL (ref 0.0–0.7)
Eosinophils Relative: 1 % (ref 0.0–5.0)
HCT: 38 % — ABNORMAL LOW (ref 39.0–52.0)
Hemoglobin: 12.5 g/dL — ABNORMAL LOW (ref 13.0–17.0)
LYMPHS ABS: 1.3 10*3/uL (ref 0.7–4.0)
Lymphocytes Relative: 19 % (ref 12.0–46.0)
MCHC: 32.8 g/dL (ref 30.0–36.0)
MCV: 101 fl — ABNORMAL HIGH (ref 78.0–100.0)
MONOS PCT: 9.8 % (ref 3.0–12.0)
Monocytes Absolute: 0.6 10*3/uL (ref 0.1–1.0)
Neutro Abs: 4.6 10*3/uL (ref 1.4–7.7)
Neutrophils Relative %: 69.7 % (ref 43.0–77.0)
PLATELETS: 87 10*3/uL — AB (ref 150.0–400.0)
RBC: 3.76 Mil/uL — ABNORMAL LOW (ref 4.22–5.81)
RDW: 15 % (ref 11.5–15.5)
WBC: 6.6 10*3/uL (ref 4.0–10.5)

## 2014-02-04 LAB — BASIC METABOLIC PANEL
BUN: 26 mg/dL — ABNORMAL HIGH (ref 6–23)
CHLORIDE: 101 meq/L (ref 96–112)
CO2: 27 meq/L (ref 19–32)
Calcium: 9.1 mg/dL (ref 8.4–10.5)
Creatinine, Ser: 1.4 mg/dL (ref 0.4–1.5)
GFR: 51.96 mL/min — ABNORMAL LOW (ref >60.00)
GLUCOSE: 101 mg/dL — AB (ref 70–99)
POTASSIUM: 4.2 meq/L (ref 3.5–5.1)
SODIUM: 136 meq/L (ref 135–145)

## 2014-02-04 MED ORDER — RANOLAZINE ER 500 MG PO TB12: 500.0000 mg | ORAL_TABLET | Freq: Two times a day (BID) | ORAL | Status: DC

## 2014-02-04 MED ORDER — FUROSEMIDE 40 MG PO TABS: ORAL_TABLET | ORAL | Status: DC

## 2014-02-04 NOTE — Patient Instructions (Addendum)
Your physician has recommended you make the following change in your medication:  1) STOP Digoxin 2) START Ranexa 500 mg twice daily 3) CHANGE how you take your Furosemide -- take 2 tablets (80mg  total) for 3 days.  Then take 1 tablet (40 mg total) one day, next day take 2 tablets (80 mg total) -- alternate for 10 days.  Then return to normal dosing.  Lab today: BMET, CBCD  You have been referred to Dr. Johney FrameAllred for consideration of A Fib ablation  Your physician has requested that you have a lexiscan myoview. For further information please visit [email or web address]. Please follow instruction sheet, as given.  Remote monitoring is used to monitor your  ICD from home. This monitoring reduces the number of office visits required to check your device to one time per year. It allows us to keep an eye on the functioning of your device to ensure it is working properly. You are scheduled for a device check from home on 05-06-2014. You may send your transmission at any time that day. If you have a wireless device, the transmission will be sent automatically. After your physician reviews your transmission, you will receive a postcard with your next transmission date.  Your physician recommends that you schedule a follow-up appointment in: 6 months with Dr.Klein   [facility]!!

## 2014-02-04 NOTE — Progress Notes (Signed)
    Patient Care Team: Aleksei Plotnikov V, MD as PCP - General (Internal Medicine) Steven C Klein, MD (Cardiology) Charles R Epes, MD (Ophthalmology) Mark A Farber, MD (Vascular Surgery)   HPI  [name] is a 80 y.o. male Seen in followup for ischemic cardiomyopathy And status post CABG in 1991;  he is status post ICD implantation  .  Catheterization 2005 demonstrated patent vein grafts. Echo cardiogram 2010 demonstrated stable left ventricular function at 25%.    He was hospitalized last week in atrial fibrillation with a rapid rate. This emerged in the context of a cough, presumed cellulitis of his left leg/knee for which he is currently on antibiotics.  He underwent TEE where smoke and organized thrombus was identified and cardioversion was deferred.  He was transitioned to a NOAC because of subtherapeutic INR and underwent cardioversion November 2015.   He is now sleeping at home on oxygen. The asymmetric edema is improving in his left leg. He still remains very weak. Renal function labs were reviewed. His creatinine ranges from 1.4--1.5. He is caring for his wife with her ever increasing needs   Past Medical History  Diagnosis Date  . Atrial fibrillation     AFIB  . Ischemic cardiomyopathy     CABG in 1991; last catheter 2005 with patent vein graft to PD, diagonal, marginal/echo 2010 EF 25%.  . implantable cardiac defibrillator -single chamber[V45.02]     [employer]  . Abdominal aneurysm without mention of rupture     s/p stent graft with endoleak requiring restenting-[facility]  . Chronic systolic heart failure     SYSTOLIC ..ACUTE ON CHRONIC  . Hypertension   . Peripheral vascular disease, unspecified   . Hemoptysis   . Acute prostatitis   . Labyrinthitis, unspecified   . Unspecified hearing loss   . Cervicalgia   . Stroke   . CKD (chronic kidney disease), stage III   . Chronic anticoagulation     Coumadin    Past Surgical History  Procedure  Laterality Date  . Insert / replace / remove pacemaker  2006    AICD GUIDANT VITALITY..IN SITU  . Colectomy  07/15/96  . Colostomy  07/15/96    COLOSTOMY TAKEN DOWN 01/14/97  . Coronary artery bypass graft  03/22/89  . Cardiac catheterization  2001    ANGIOPLASTY W/STENT RCA  . Abdominal aortic aneurysm repair  05/1999    STENT, ENDOVASCULAR RELINING OF GRAFT 3/11  . Cardioversion    . Tee without cardioversion N/A 12/12/2013    Procedure: TRANSESOPHAGEAL ECHOCARDIOGRAM (TEE);  Surgeon: Katarina H Nelson, MD;  Location: [facility];  Service: Cardiovascular;  Laterality: N/A;  . Cardioversion N/A 01/06/2014    Procedure: CARDIOVERSION;  Surgeon: Dalton S McLean, MD;  Location: [facility];  Service: Cardiovascular;  Laterality: N/A;  . Implantable cardioverter defibrillator (icd) generator change N/A 01/18/2011    Procedure: ICD GENERATOR CHANGE;  Surgeon: Steven C Klein, MD;  Location: [facility] CATH [facility];  Service: Cardiovascular;  Laterality: N/A;    Current Outpatient Prescriptions  Medication Sig Dispense Refill  . acetaminophen (TYLENOL) 500 MG tablet Take 1,000 mg by mouth every morning.     . apixaban (ELIQUIS) 2.5 MG TABS tablet Take 1 tablet (2.5 mg total) by mouth 2 (two) times daily. 60 tablet 0  . benazepril (LOTENSIN) 20 MG tablet Take 10 mg by mouth daily.     . Carboxymethylcellulose Sodium (REFRESH TEARS OP) Place 1-2 drops into both eyes daily as needed (  dry eyes).    . carvedilol (COREG) 6.25 MG tablet Take 1 tablet (6.25 mg total) by mouth 2 (two) times daily with a meal. 60 tablet 11  . Copper Gluconate 2 MG TABS Take 2 mg by mouth daily as needed (joint pain).     . diclofenac sodium (VOLTAREN) 1 % GEL Apply 4 g topically 2 (two) times daily as needed (pain).    . digoxin (LANOXIN) 0.0625 mg TABS tablet Take 0.5 tablets (0.0625 mg total) by mouth daily. 15 tablet 3  . dofetilide (TIKOSYN) 125 MCG capsule Take 125 mcg by mouth 2 (two) times daily.    . furosemide (LASIX) 40 MG  tablet Take 1 tablet (40 mg total) by mouth daily. 60 tablet 9  . Magnesium 250 MG TABS Take 250 mg by mouth every evening.     . Multiple Vitamin (MULTIVITAMIN) tablet Take 1 tablet by mouth daily.     . nitroGLYCERIN (NITROSTAT) 0.4 MG SL tablet Place 0.4 mg under the tongue every 5 (five) minutes as needed for chest pain.     . Polyethyl Glycol-Propyl Glycol (SYSTANE OP) Place 1-2 drops into both eyes daily as needed (dry eyes).     . simvastatin (ZOCOR) 40 MG tablet Take 20 mg by mouth every evening.     . sodium chloride (OCEAN) 0.65 % SOLN nasal spray Place 1 spray into both nostrils daily.     No current facility-administered medications for this visit.    Allergies  Allergen Reactions  . Promethazine Hcl Other (See Comments)    REACTION: world spins..    Review of Systems negative except from HPI and PMH  Physical Exam BP 110/64 mmHg  Pulse 77  Ht 6' 1" (1.854 m)  Wt 182 lb (82.555 kg)  BMI 24.02 kg/m2 Well developed and well nourished in no acute distress HENT normal E scleral and icterus clear Neck Supple JVP  8-9  carotids brisk and full  +HJR Clear to ausculation Device pocket well healed; without hematoma or erythema.  There is no tethering  Irregularly irregular rate and rhythm with a 2/6 systolic murmur Soft with active bowel sounds No clubbing cyanosis  Left greater than right 1-2+Edema Alert and oriented, grossly normal motor and sensory function Skin Warm and Dry  ECG demonstrates atrial fibrillation at 77 Exline intervals-/14/43 Axis -34  Assessment and  Plan  Ischemic Cardiomyopathy  Implantable Defib [employer] The patient's device was interrogated.  The information was reviewed. No changes were made in the programming.     congestive heart failure-systolic-acute on chronic  Atrial fibrillation-persistent  Management of high-risk medications  Left atrial clot  He has reverted to atrial fibrillation and with that his congestive heart  failure has decompensated again. He is volume overloaded.  We discussed treatment options for atrial fibrillation. They are few. 1-add Ranexa and repeat cardioversion; 2-use amiodarone, 3-catheter ablation, 4-nothing. He is reluctant to consider amiodarone because of its side effects. His current situation is not very pleasing. We will plan to add Ranexa and repeat cardioversion. In parallel, we will ask him to see Dr. Allred for consideration of ablation not withstanding his age. I am not sanguine however, that this would be undertaken. I have shared this with him.  His device is a single chamber device; hence, I don't think the augmented benefit of regularization in the context of a non-CRT device would justify AV junction ablation. Furthermore, I don't anticipate that the augmented benefit of regularization would justify CRT upgrade. His   heart rates are well controlled currently.  With his congestive heart failure, we will change his diuretics, increasing him from 40--80 for 3 days and then having alternate 80/40.  His nausea is concerning in the context of digoxin therapy; we will discontinue it  Renal function 2 weeks ago had a GFR of 43. This limits dofetilide dosing.  We will undertake a Myoview scan. It is possible that some of his symptoms of heart failure are caused by ischemia.   

## 2014-02-09 NOTE — Anesthesia Preprocedure Evaluation (Signed)
Anesthesia Evaluation  Patient identified by MRN, date of birth, ID band Patient awake    Reviewed: Allergy & Precautions, H&P , NPO status , Patient's Chart, lab work & pertinent test results, reviewed documented beta blocker date and time   Airway        Dental   Pulmonary former smoker (quit 1973),          Cardiovascular hypertension, + CAD, + CABG (1991) and +CHF + dysrhythmias Atrial Fibrillation + Cardiac Defibrillator  ECHO 01/2014 EF 15-20%   Neuro/Psych    GI/Hepatic negative GI ROS, Neg liver ROS,   Endo/Other    Renal/GU GFR 48%     Musculoskeletal   Abdominal   Peds  Hematology   Anesthesia Other Findings   Reproductive/Obstetrics                             Anesthesia Physical Anesthesia Plan  ASA: III  Anesthesia Plan: MAC   Post-op Pain Management:    Induction: Intravenous  Airway Management Planned: Nasal Cannula  Additional Equipment:   Intra-op Plan:   Post-operative Plan:   Informed Consent: I have reviewed the patients History and Physical, chart, labs and discussed the procedure including the risks, benefits and alternatives for the proposed anesthesia with the patient or authorized representative who has indicated his/her understanding and acceptance.     Plan Discussed with:   Anesthesia Plan Comments: (Severely reduced EF 15-20%, will titrate sedation slowly)        Anesthesia Quick Evaluation

## 2014-02-10 ENCOUNTER — Ambulatory Visit (HOSPITAL_COMMUNITY): Payer: Medicare Other | Admitting: Anesthesiology

## 2014-02-10 ENCOUNTER — Telehealth: Payer: Self-pay | Admitting: Internal Medicine

## 2014-02-10 ENCOUNTER — Ambulatory Visit (HOSPITAL_COMMUNITY)
Admission: RE | Admit: 2014-02-10 | Discharge: 2014-02-10 | Disposition: A | Discharge status: Home / Self Care | Payer: Medicare Other | Source: Ambulatory Visit | Attending: Cardiology | Admitting: Cardiology

## 2014-02-10 ENCOUNTER — Encounter (HOSPITAL_COMMUNITY)
Admission: RE | Disposition: A | Discharge status: Home / Self Care | Payer: Self-pay | Source: Ambulatory Visit | Attending: Cardiology

## 2014-02-10 ENCOUNTER — Encounter: Payer: Self-pay | Admitting: Cardiology

## 2014-02-10 ENCOUNTER — Encounter: Payer: Self-pay | Admitting: Cardiovascular Disease

## 2014-02-10 ENCOUNTER — Encounter (HOSPITAL_COMMUNITY): Payer: Self-pay | Admitting: *Deleted

## 2014-02-10 DIAGNOSIS — I240 Acute coronary thrombosis not resulting in myocardial infarction: Secondary | ICD-10-CM | POA: Insufficient documentation

## 2014-02-10 DIAGNOSIS — I255 Ischemic cardiomyopathy: Secondary | ICD-10-CM | POA: Diagnosis not present

## 2014-02-10 DIAGNOSIS — I5023 Acute on chronic systolic (congestive) heart failure: Secondary | ICD-10-CM | POA: Insufficient documentation

## 2014-02-10 DIAGNOSIS — I2510 Atherosclerotic heart disease of native coronary artery without angina pectoris: Secondary | ICD-10-CM | POA: Diagnosis not present

## 2014-02-10 DIAGNOSIS — I480 Paroxysmal atrial fibrillation: Secondary | ICD-10-CM

## 2014-02-10 DIAGNOSIS — I447 Left bundle-branch block, unspecified: Secondary | ICD-10-CM | POA: Insufficient documentation

## 2014-02-10 DIAGNOSIS — I639 Cerebral infarction, unspecified: Secondary | ICD-10-CM | POA: Insufficient documentation

## 2014-02-10 DIAGNOSIS — Z7901 Long term (current) use of anticoagulants: Secondary | ICD-10-CM | POA: Insufficient documentation

## 2014-02-10 DIAGNOSIS — I129 Hypertensive chronic kidney disease with stage 1 through stage 4 chronic kidney disease, or unspecified chronic kidney disease: Secondary | ICD-10-CM | POA: Diagnosis not present

## 2014-02-10 DIAGNOSIS — Z951 Presence of aortocoronary bypass graft: Secondary | ICD-10-CM | POA: Diagnosis not present

## 2014-02-10 DIAGNOSIS — I4891 Unspecified atrial fibrillation: Secondary | ICD-10-CM | POA: Diagnosis present

## 2014-02-10 DIAGNOSIS — Z79899 Other long term (current) drug therapy: Secondary | ICD-10-CM | POA: Diagnosis not present

## 2014-02-10 DIAGNOSIS — N183 Chronic kidney disease, stage 3 (moderate): Secondary | ICD-10-CM | POA: Insufficient documentation

## 2014-02-10 DIAGNOSIS — Z87891 Personal history of nicotine dependence: Secondary | ICD-10-CM | POA: Diagnosis not present

## 2014-02-10 DIAGNOSIS — I509 Heart failure, unspecified: Secondary | ICD-10-CM | POA: Insufficient documentation

## 2014-02-10 DIAGNOSIS — H9190 Unspecified hearing loss, unspecified ear: Secondary | ICD-10-CM | POA: Insufficient documentation

## 2014-02-10 DIAGNOSIS — I440 Atrioventricular block, first degree: Secondary | ICD-10-CM | POA: Insufficient documentation

## 2014-02-10 DIAGNOSIS — I739 Peripheral vascular disease, unspecified: Secondary | ICD-10-CM | POA: Diagnosis not present

## 2014-02-10 HISTORY — PX: CARDIOVERSION: SHX1299

## 2014-02-10 SURGERY — CARDIOVERSION
Anesthesia: Monitor Anesthesia Care

## 2014-02-10 MED ORDER — EPHEDRINE 5 MG/ML INJ
INTRAVENOUS | Status: DC | PRN
  Administered 2014-02-10: 10 mg via INTRAVENOUS

## 2014-02-10 MED ORDER — PROPOFOL 10 MG/ML IV BOLUS
INTRAVENOUS | Status: DC | PRN
Start: 2014-02-10 — End: 2014-02-10
  Administered 2014-02-10: 40 mg via INTRAVENOUS

## 2014-02-10 MED ORDER — LIDOCAINE HCL (CARDIAC) 20 MG/ML IV SOLN
INTRAVENOUS | Status: DC | PRN
  Administered 2014-02-10: 40 mg via INTRAVENOUS

## 2014-02-10 MED ORDER — SODIUM CHLORIDE 0.9 % IV SOLN
INTRAVENOUS | Status: DC
  Administered 2014-02-10: 09:00:00 via INTRAVENOUS

## 2014-02-10 NOTE — Telephone Encounter (Signed)
New message        Pt would like to know if he still needs to do a nuclear stress test

## 2014-02-10 NOTE — Interval H&P Note (Signed)
History and Physical Interval Note:  02/10/2014 10:27 AM  [name] BoyerJohn E Gall  has presented today for surgery, with the diagnosis of AFIB  The various methods of treatment have been discussed with the patient and family. After consideration of risks, benefits and other options for treatment, the patient has consented to  Procedure(s): CARDIOVERSION (N/A) as a surgical intervention .  The patient's history has been reviewed, patient examined, no change in status, stable for surgery.  I have reviewed the patient's chart and labs.  Questions were answered to the patient's satisfaction.     Delno Blaisdell R

## 2014-02-10 NOTE — Discharge Instructions (Signed)
Conscious Sedation, Adult, Care After °Refer to this sheet in the next few weeks. These instructions provide you with information on caring for yourself after your procedure. Your health care provider may also give you more specific instructions. Your treatment has been planned according to current medical practices, but problems sometimes occur. Call your health care provider if you have any problems or questions after your procedure. °WHAT TO EXPECT AFTER THE PROCEDURE  °After your procedure: °· You may feel sleepy, clumsy, and have poor balance for several hours. °· Vomiting may occur if you eat too soon after the procedure. °HOME CARE INSTRUCTIONS °· Do not participate in any activities where you could become injured for at least 24 hours. Do not: °¨ Drive. °¨ Swim. °¨ Ride a bicycle. °¨ Operate heavy machinery. °¨ Cook. °¨ Use power tools. °¨ Climb ladders. °¨ Work from a high place. °· Do not make important decisions or sign legal documents until you are improved. °· If you vomit, drink water, juice, or soup when you can drink without vomiting. Make sure you have little or no nausea before eating solid foods. °· Only take over-the-counter or prescription medicines for pain, discomfort, or fever as directed by your health care provider. °· Make sure you and your family fully understand everything about the medicines given to you, including what side effects may occur. °· You should not drink alcohol, take sleeping pills, or take medicines that cause drowsiness for at least 24 hours. °· If you smoke, do not smoke without supervision. °· If you are feeling better, you may resume normal activities 24 hours after you were sedated. °· Keep all appointments with your health care provider. °SEEK MEDICAL CARE IF: °· Your skin is pale or bluish in color. °· You continue to feel nauseous or vomit. °· Your pain is getting worse and is not helped by medicine. °· You have bleeding or swelling. °· You are still sleepy or  feeling clumsy after 24 hours. °SEEK IMMEDIATE MEDICAL CARE IF: °· You develop a rash. °· You have difficulty breathing. °· You develop any type of allergic problem. °· You have a fever. °MAKE SURE YOU: °· Understand these instructions. °· Will watch your condition. °· Will get help right away if you are not doing well or get worse. °Document Released: 11/21/2012 Document Reviewed: 11/21/2012 °ExitCare® Patient Information ©2015 [employer]. This information is not intended to replace advice given to you by your health care provider. Make sure you discuss any questions you have with your health care provider. °  °

## 2014-02-10 NOTE — Anesthesia Postprocedure Evaluation (Signed)
  Anesthesia Post-op Note  Patient: Posey BoyerJohn E Pop  Procedure(s) Performed: Procedure(s): CARDIOVERSION (N/A)  Patient Location: PACU  Anesthesia Type:MAC  Level of Consciousness: awake  Airway and Oxygen Therapy: Patient Spontanous Breathing and Patient connected to nasal cannula oxygen  Post-op Pain: none  Post-op Assessment: Post-op Vital signs reviewed and Patient's Cardiovascular Status Stable  Post-op Vital Signs: Reviewed and stable  Last Vitals:  Filed Vitals:   02/10/14 1057  BP: 97/49  Pulse:   Resp: 26    Complications: No apparent anesthesia complications

## 2014-02-10 NOTE — Transfer of Care (Signed)
Immediate Anesthesia Transfer of Care Note  Patient: [name] BoyerJohn E [name]  Procedure(s) Performed: Procedure(s): CARDIOVERSION (N/A)  Patient Location: Endoscopy Unit  Anesthesia Type:MAC  Level of Consciousness: awake, alert , oriented and patient cooperative  Airway & Oxygen Therapy: Patient Spontanous Breathing and Patient connected to nasal cannula oxygen  Post-op Assessment: Report given to PACU RN, Post -op Vital signs reviewed and stable and Patient moving all extremities  Post vital signs: Reviewed and stable  Complications: No apparent anesthesia complications

## 2014-02-10 NOTE — Interval H&P Note (Signed)
History and Physical Interval Note:  02/10/2014 10:31 AM  [name] BoyerJohn E [name]  has presented today for surgery, with the diagnosis of AFIB  The various methods of treatment have been discussed with the patient and family. After consideration of risks, benefits and other options for treatment, the patient has consented to  Procedure(s): CARDIOVERSION (N/A) as a surgical intervention .  The patient's history has been reviewed, patient examined, no change in status, stable for surgery.  I have reviewed the patient's chart and labs.  Questions were answered to the patient's satisfaction.     Skie Vitrano R

## 2014-02-10 NOTE — Interval H&P Note (Signed)
History and Physical Interval Note:  02/10/2014 10:31 AM  [name]  has presented today for surgery, with the diagnosis of AFIB  The various methods of treatment have been discussed with the patient and family. After consideration of risks, benefits and other options for treatment, the patient has consented to  Procedure(s): CARDIOVERSION (N/A) as a surgical intervention .  The patient's history has been reviewed, patient examined, no change in status, stable for surgery.  I have reviewed the patient's chart and labs.  Questions were answered to the patient's satisfaction.     TURNER,TRACI R   

## 2014-02-10 NOTE — Interval H&P Note (Signed)
History and Physical Interval Note:  02/10/2014 10:05 AM  [name] BoyerJohn E Fichter  has presented today for surgery, with the diagnosis of AFIB  The various methods of treatment have been discussed with the patient and family. After consideration of risks, benefits and other options for treatment, the patient has consented to  Procedure(s): CARDIOVERSION (N/A) as a surgical intervention .  The patient's history has been reviewed, patient examined, no change in status, stable for surgery.  I have reviewed the patient's chart and labs.  Questions were answered to the patient's satisfaction.     Zyrion Coey R

## 2014-02-10 NOTE — H&P (View-Only) (Signed)
Patient Care Team: Tresa GarterAleksei Plotnikov V, MD as PCP - General (Internal Medicine) Duke SalviaSteven C Loranda Mastel, MD (Cardiology) Lamar Benesharles R Epes, MD (Ophthalmology) Suszanne FinchMark A Farber, MD (Vascular Surgery)   HPI  [name] is a 80 y.o. male Seen in followup for ischemic cardiomyopathy And status post CABG in 1991;  he is status post ICD implantation  .  Catheterization 2005 demonstrated patent vein grafts. Echo cardiogram 2010 demonstrated stable left ventricular function at 25%.    He was hospitalized last week in atrial fibrillation with a rapid rate. This emerged in the context of a cough, presumed cellulitis of his left leg/knee for which he is currently on antibiotics.  He underwent TEE where smoke and organized thrombus was identified and cardioversion was deferred.  He was transitioned to a NOAC because of subtherapeutic INR and underwent cardioversion November 2015.   He is now sleeping at home on oxygen. The asymmetric edema is improving in his left leg. He still remains very weak. Renal function labs were reviewed. His creatinine ranges from 1.4--1.5. He is caring for his wife with her ever increasing needs   Past Medical History  Diagnosis Date  . Atrial fibrillation     AFIB  . Ischemic cardiomyopathy     CABG in 1991; last catheter 2005 with patent vein graft to PD, diagonal, marginal/echo 2010 EF 25%.  . 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 Abdominal aneurysm without mention of rupture     s/p stent graft with endoleak requiring restenting-[facility]  . Chronic systolic heart failure     SYSTOLIC .Marland Kitchen.ACUTE ON CHRONIC  . Hypertension   . Peripheral vascular disease, unspecified   . Hemoptysis   . Acute prostatitis   . Labyrinthitis, unspecified   . Unspecified hearing loss   . Cervicalgia   . Stroke   . CKD (chronic kidney disease), stage III   . Chronic anticoagulation     Coumadin    Past Surgical History  Procedure  Laterality Date  . Insert / replace / remove pacemaker  2006    AICD GUIDANT VITALITY..IN SITU  . Colectomy  07/15/96  . Colostomy  07/15/96    COLOSTOMY TAKEN DOWN 01/14/97  . Coronary artery bypass graft  03/22/89  . Cardiac catheterization  2001    ANGIOPLASTY W/STENT RCA  . Abdominal aortic aneurysm repair  05/1999    STENT, ENDOVASCULAR RELINING OF GRAFT 3/11  . Cardioversion    . Tee without cardioversion N/A 12/12/2013    Procedure: TRANSESOPHAGEAL ECHOCARDIOGRAM (TEE);  Surgeon: [name], MD;  [facility];  Service: Cardiovascular;  Laterality: N/A;  . Cardioversion N/A 01/06/2014    Procedure: CARDIOVERSION;  Surgeon: Laurey Moralealton S McLean, MD;  [facility];  Service: Cardiovascular;  Laterality: N/A;  . Implantable cardioverter defibrillator (icd) generator change N/A 01/18/2011    Procedure: ICD GENERATOR CHANGE;  Surgeon: Duke SalviaSteven C Draden Cottingham, MD;  [facility];  Service: Cardiovascular;  Laterality: N/A;    Current Outpatient Prescriptions  Medication Sig Dispense Refill  . acetaminophen (TYLENOL) 500 MG tablet Take 1,000 mg by mouth every morning.     Marland Kitchen. apixaban (ELIQUIS) 2.5 MG TABS tablet Take 1 tablet (2.5 mg total) by mouth 2 (two) times daily. 60 tablet 0  . benazepril (LOTENSIN) 20 MG tablet Take 10 mg by mouth daily.     . Carboxymethylcellulose Sodium (REFRESH TEARS OP) Place 1-2 drops into both eyes daily as needed (  dry eyes).    . carvedilol (COREG) 6.25 MG tablet Take 1 tablet (6.25 mg total) by mouth 2 (two) times daily with a meal. 60 tablet 11  . Copper Gluconate 2 MG TABS Take 2 mg by mouth daily as needed (joint pain).     Marland Kitchen diclofenac sodium (VOLTAREN) 1 % GEL Apply 4 g topically 2 (two) times daily as needed (pain).    Marland Kitchen digoxin (LANOXIN) 0.0625 mg TABS tablet Take 0.5 tablets (0.0625 mg total) by mouth daily. 15 tablet 3  . dofetilide (TIKOSYN) 125 MCG capsule Take 125 mcg by mouth 2 (two) times daily.    . furosemide (LASIX) 40 MG  tablet Take 1 tablet (40 mg total) by mouth daily. 60 tablet 9  . Magnesium 250 MG TABS Take 250 mg by mouth every evening.     . Multiple Vitamin (MULTIVITAMIN) tablet Take 1 tablet by mouth daily.     . nitroGLYCERIN (NITROSTAT) 0.4 MG SL tablet Place 0.4 mg under the tongue every 5 (five) minutes as needed for chest pain.     Bertram Gala Glycol-Propyl Glycol (SYSTANE OP) Place 1-2 drops into both eyes daily as needed (dry eyes).     . simvastatin (ZOCOR) 40 MG tablet Take 20 mg by mouth every evening.     . sodium chloride (OCEAN) 0.65 % SOLN nasal spray Place 1 spray into both nostrils daily.     No current facility-administered medications for this visit.    Allergies  Allergen Reactions  . Promethazine Hcl Other (See Comments)    REACTION: world spins..    Review of Systems negative except from HPI and PMH  Physical Exam BP 110/64 mmHg  Pulse 77  Ht 6\' 1"  (1.854 m)  Wt 182 lb (82.555 kg)  BMI 24.02 kg/m2 Well developed and well nourished in no acute distress HENT normal E scleral and icterus clear Neck Supple JVP  8-9  carotids brisk and full  +HJR Clear to ausculation Device pocket well healed; without hematoma or erythema.  There is no tethering  Irregularly irregular rate and rhythm with a 2/6 systolic murmur Soft with active bowel sounds No clubbing cyanosis  Left greater than right 1-2+Edema Alert and oriented, grossly normal motor and sensory function Skin Warm and Dry  ECG demonstrates atrial fibrillation at 77 Exline intervals-/14/43 Axis -34  Assessment and  Plan  Ischemic Cardiomyopathy  Implantable Defib [employer] The patient's device was interrogated.  The information was reviewed. No changes were made in the programming.     congestive heart failure-systolic-acute on chronic  Atrial fibrillation-persistent  Management of high-risk medications  Left atrial clot  He has reverted to atrial fibrillation and with that his congestive heart  failure has decompensated again. He is volume overloaded.  We discussed treatment options for atrial fibrillation. They are few. 1-add Ranexa and repeat cardioversion; 2-use amiodarone, 3-catheter ablation, 4-nothing. He is reluctant to consider amiodarone because of its side effects. His current situation is not very pleasing. We will plan to add Ranexa and repeat cardioversion. In parallel, we will ask him to see Dr. Johney Frame for consideration of ablation not withstanding his age. I am not sanguine however, that this would be undertaken. I have shared this with him.  His device is a single chamber device; hence, I don't think the augmented benefit of regularization in the context of a non-CRT device would justify AV junction ablation. Furthermore, I don't anticipate that the augmented benefit of regularization would justify CRT upgrade. His  heart rates are well controlled currently.  With his congestive heart failure, we will change his diuretics, increasing him from 40--80 for 3 days and then having alternate 80/40.  His nausea is concerning in the context of digoxin therapy; we will discontinue it  Renal function 2 weeks ago had a GFR of 43. This limits dofetilide dosing.  We will undertake a Myoview scan. It is possible that some of his symptoms of heart failure are caused by ischemia.

## 2014-02-10 NOTE — CV Procedure (Signed)
   Electrical Cardioversion Procedure Note [name] [id number] 1926-11-16  Procedure: Electrical Cardioversion Indications:  Atrial Fibrillation  Time Out: Verified patient identification, verified procedure,medications/allergies/relevent history reviewed, required imaging and test results available.  Performed  Procedure Details  The patient was NPO after midnight. Anesthesia was [facility] at the beside  [facility] with 40mg  of propofol and Lidocaine 50mg .  Cardioversion was done with synchronized biphasic defibrillation with AP pads with 150watts.  The patient converted to normal sinus rhythm. The patient tolerated the procedure well   IMPRESSION:  Successful cardioversion of atrial fibrillation    [name] 02/10/2014, 10:06 AM

## 2014-02-11 ENCOUNTER — Encounter (HOSPITAL_COMMUNITY): Payer: Self-pay | Admitting: Cardiology

## 2014-02-11 ENCOUNTER — Ambulatory Visit (INDEPENDENT_AMBULATORY_CARE_PROVIDER_SITE_OTHER): Payer: Medicare Other | Admitting: Internal Medicine

## 2014-02-11 ENCOUNTER — Telehealth: Payer: Self-pay | Admitting: Internal Medicine

## 2014-02-11 VITALS — BP 120/82 | HR 84 | Temp 97.6°F | Wt 181.0 lb

## 2014-02-11 DIAGNOSIS — R05 Cough: Secondary | ICD-10-CM

## 2014-02-11 DIAGNOSIS — N183 Chronic kidney disease, stage 3 unspecified: Secondary | ICD-10-CM

## 2014-02-11 DIAGNOSIS — M7042 Prepatellar bursitis, left knee: Secondary | ICD-10-CM

## 2014-02-11 DIAGNOSIS — M71162 Other infective bursitis, left knee: Secondary | ICD-10-CM

## 2014-02-11 DIAGNOSIS — R059 Cough, unspecified: Secondary | ICD-10-CM

## 2014-02-11 DIAGNOSIS — I482 Chronic atrial fibrillation, unspecified: Secondary | ICD-10-CM

## 2014-02-11 DIAGNOSIS — I5023 Acute on chronic systolic (congestive) heart failure: Secondary | ICD-10-CM

## 2014-02-11 DIAGNOSIS — Z7901 Long term (current) use of anticoagulants: Secondary | ICD-10-CM

## 2014-02-11 DIAGNOSIS — I2510 Atherosclerotic heart disease of native coronary artery without angina pectoris: Secondary | ICD-10-CM

## 2014-02-11 DIAGNOSIS — I5022 Chronic systolic (congestive) heart failure: Secondary | ICD-10-CM

## 2014-02-11 MED ORDER — LOSARTAN POTASSIUM 50 MG PO TABS: 50.0000 mg | ORAL_TABLET | Freq: Every day | ORAL | Status: DC

## 2014-02-11 NOTE — Patient Instructions (Signed)
Stop Benazepril Start Losartan

## 2014-02-11 NOTE — Telephone Encounter (Signed)
Patient was schedule for test on 02-12-14 - Patient (will not be able to make it but does not see the purpose of this test   will call back to rs)/ will defer x 2 months.

## 2014-02-11 NOTE — Assessment & Plan Note (Signed)
In NSR now S/p Electrical Cardioversion x2 Nov-Dec 2015 Continue with current prescription therapy as reflected on the Med list.

## 2014-02-11 NOTE — Progress Notes (Signed)
Pre visit review using our clinic review tool, if applicable. No additional management support is needed unless otherwise documented below in the visit note. 

## 2014-02-11 NOTE — Assessment & Plan Note (Signed)
Continue with current prescription therapy as reflected on the Med list.  

## 2014-02-11 NOTE — Assessment & Plan Note (Signed)
2015  x5 months ?ACE related D/c Benazepril and start Losartan 50 mg/d

## 2014-02-11 NOTE — Assessment & Plan Note (Signed)
Labs

## 2014-02-11 NOTE — Progress Notes (Signed)
   Subjective:   HPI  F/u fatigue. C/o dry cough since summer - worse...  Med changes reviewed.  02/10/14 - Successful cardioversion of atrial fibrillation - feeling better...  F/u vertigo - resolved, fatigue - better  The patient presents for a follow-up of CHF, A fib, pacemaker  chronic hypertension, chronic dyslipidemia controlled with medicines  F/u  AAA - Dr Bennie PieriniMark Farber - a complex long hx - STENT and STENT repair in 2011    Review of Systems  Constitutional: Negative for appetite change, fatigue and unexpected weight change.  HENT: Positive for hearing loss. Negative for congestion, nosebleeds, sneezing, sore throat and trouble swallowing.   Eyes: Negative for itching and visual disturbance.  Respiratory: Negative for cough.   Cardiovascular: Negative for chest pain, palpitations and leg swelling.  Gastrointestinal: Negative for nausea, diarrhea, blood in stool and abdominal distention.  Genitourinary: Negative for frequency and hematuria.  Musculoskeletal: Negative for back pain, joint swelling, gait problem and neck pain.  Skin: Negative for rash.  Neurological: Negative for dizziness, tremors, speech difficulty and weakness.  Psychiatric/Behavioral: Negative for suicidal ideas, sleep disturbance, dysphoric mood and agitation. The patient is not nervous/anxious.        Objective:   Physical Exam  Constitutional: He is oriented to person, place, and time. He appears well-developed. No distress.  NAD  HENT:  Mouth/Throat: Oropharynx is clear and moist.  Eyes: Conjunctivae are normal. Pupils are equal, round, and reactive to light.  Neck: Normal range of motion. No JVD present. No thyromegaly present.  Cardiovascular: Normal rate, regular rhythm, normal heart sounds and intact distal pulses.  Exam reveals no gallop and no friction rub.   No murmur heard. Pulmonary/Chest: Effort normal and breath sounds normal. No respiratory distress. He has no wheezes. He has no  rales. He exhibits no tenderness.  Abdominal: Soft. Bowel sounds are normal. He exhibits no distension and no mass. There is no tenderness. There is no rebound and no guarding.  Musculoskeletal: Normal range of motion. He exhibits no edema or tenderness.  Lymphadenopathy:    He has no cervical adenopathy.  Neurological: He is alert and oriented to person, place, and time. He has normal reflexes. No cranial nerve deficit. He exhibits normal muscle tone. He displays a negative Romberg sign. Coordination and gait normal.  Skin: Skin is warm and dry. No rash noted.  Psychiatric: He has a normal mood and affect. His behavior is normal. Judgment and thought content normal.  Looks tired L knee with no prepatellar redness   Abd CT angio 01/12/09  IMPRESSION:  1. No definite endovascular leak is currently seen.  2. However, the native aneurysm sac has increased slightly in  size, now measuring 56 x 60 mm compared to 55 x 55 mm previously.  3. Incidental gallstones.  CTA PELVIS  Findings: The iliac limbs of the AAA stent opacify with no  significant abnormality noted. Both the internal and external  iliac arteries opacify. The urinary bladder is slightly thick-  walled. The prostate is moderately prominent. Postop changes from  prior left hemicolectomy are again noted.  Review of the MIP images confirms the above findings.  IMPRESSION:  1. Stable iliac limbs of AAA stent.  2. Probable some degree of bladder outlet obstruction with  moderately enlarged prostate.     Recent procedures reviewed    Assessment & Plan:

## 2014-02-11 NOTE — Assessment & Plan Note (Signed)
Resolving

## 2014-02-11 NOTE — Assessment & Plan Note (Signed)
Continue with current prescription therapy as reflected on the Med list. Sleep test is pending to r/o OSA

## 2014-02-11 NOTE — Assessment & Plan Note (Signed)
S/p Electrical Cardioversion x2 Nov-Dec 2015 In NSR now

## 2014-02-12 ENCOUNTER — Encounter (HOSPITAL_COMMUNITY): Payer: Medicare Other

## 2014-02-13 ENCOUNTER — Other Ambulatory Visit: Payer: Self-pay | Admitting: Internal Medicine

## 2014-02-17 NOTE — Telephone Encounter (Signed)
See 12/28 telephone note

## 2014-02-17 NOTE — Telephone Encounter (Signed)
Advised patient we still need stress testing. He complains that he is very weak right now and would like time to regain some strength before testing. Informed him it was ok to have testing after he feels better. He will schedule it for several weeks from now to be safe and aware office will call him to arrange.

## 2014-02-25 ENCOUNTER — Ambulatory Visit (HOSPITAL_COMMUNITY): Payer: Medicare Other | Attending: Cardiology | Admitting: Radiology

## 2014-02-25 DIAGNOSIS — I2510 Atherosclerotic heart disease of native coronary artery without angina pectoris: Secondary | ICD-10-CM | POA: Insufficient documentation

## 2014-02-25 DIAGNOSIS — I10 Essential (primary) hypertension: Secondary | ICD-10-CM | POA: Diagnosis not present

## 2014-02-25 DIAGNOSIS — R0602 Shortness of breath: Secondary | ICD-10-CM | POA: Insufficient documentation

## 2014-02-25 DIAGNOSIS — I481 Persistent atrial fibrillation: Secondary | ICD-10-CM

## 2014-02-25 DIAGNOSIS — Z951 Presence of aortocoronary bypass graft: Secondary | ICD-10-CM | POA: Diagnosis not present

## 2014-02-25 DIAGNOSIS — I5023 Acute on chronic systolic (congestive) heart failure: Secondary | ICD-10-CM

## 2014-02-25 DIAGNOSIS — R9431 Abnormal electrocardiogram [ECG] [EKG]: Secondary | ICD-10-CM | POA: Insufficient documentation

## 2014-02-25 MED ORDER — TECHNETIUM TC 99M SESTAMIBI GENERIC - CARDIOLITE
33.0000 | Freq: Once | INTRAVENOUS | Status: AC | PRN
  Administered 2014-02-25: 33 via INTRAVENOUS

## 2014-02-25 MED ORDER — TECHNETIUM TC 99M SESTAMIBI GENERIC - CARDIOLITE
11.0000 | Freq: Once | INTRAVENOUS | Status: AC | PRN
  Administered 2014-02-25: 11 via INTRAVENOUS

## 2014-02-25 MED ORDER — ADENOSINE (DIAGNOSTIC) 3 MG/ML IV SOLN
0.5600 mg/kg | Freq: Once | INTRAVENOUS | Status: AC
  Administered 2014-02-25: 46.2 mg via INTRAVENOUS

## 2014-02-25 NOTE — Progress Notes (Signed)
[facility] SITE 3 NUCLEAR MED [address] , [place] [address] [phone]    Cardiology Nuclear Med Study  [name] is a 81 y.o. male     MRN : [id number]     DOB: 1926/07/04  Procedure Date: 02/25/2014  Nuclear Med Background Indication for Stress Test:  Evaluation for Ischemia, Abnormal EKG and Follow up CAD History:  CABG, hx AAA repair, 2006 Normal MPI, AICD, Atrial Fibrillation Cardiac Risk Factors: Hypertension  Symptoms:  SOB   Nuclear Pre-Procedure Caffeine/Decaff Intake:  None NPO After: 7:00pm   Lungs:  clear O2 Sat: 94% on room air. IV 0.9% NS with Angio Cath:  22g  IV Site: R Hand  IV Started by:  Bonnita LevanJackie Smith, RN  Chest Size (in):  44 Cup Size: n/a  Height: 6\' 1"  (1.854 m)  Weight:  182 lb (82.555 kg)  BMI:  Body mass index is 24.02 kg/(m^2). Tech Comments:  N/A    Nuclear Med Study 1 or 2 day study: 1 day  Stress Test Type:  Adenosine  Reading MD: N/A  Order Authorizing Provider:  Berton MountSteve Klein, MD  Resting Radionuclide: Technetium 3753m Sestamibi  Resting Radionuclide Dose: 11.0 mCi   Stress Radionuclide:  Technetium 6953m Sestamibi  Stress Radionuclide Dose: 33.0 mCi           Stress Protocol Rest HR: 80 Stress HR: 86  Rest BP: 104/65 Stress BP: 110/72  Exercise Time (min): n/a METS: n/a   Predicted Max HR: 135 bpm % Max HR: 63.7 bpm Rate Pressure Product: 9460   Dose of Adenosine (mg):  n/a Dose of Lexiscan: 0.4 mg  Dose of Atropine (mg): n/a Dose of Dobutamine: n/a mcg/kg/min (at max HR)  Stress Test Technologist: Bonnita LevanJackie Smith, RN  Nuclear Technologist:  Kerby NoraElzbieta Kubak, CNMT     Rest Procedure:  Myocardial perfusion imaging was performed at rest 45 minutes following the intravenous administration of Technetium 4953m Sestamibi. Rest ECG: Atrial Fibrilliation, negative T waves in the inferolateral leads  Stress Procedure:  The patient received IV adenosine at 140 mcg/kg/min for 4 minutes.  Technetium 5853m Sestamibi was injected at  the 2 minute mark and quantitative spect images were obtained after a 45 minute delay. Stress ECG: No significant change from baseline ECG  QPS Raw Data Images:  No significant extracardiac activity.  Stress Images:  There is decreased uptake in the basal and mid anteroseptal, inferoseptal, inferior, inferolateral, anterolateral walls, all of the apical segments and in the true apex. Rest Images:  There is decreased uptake in the basal and mid anteroseptal, inferoseptal, inferior, inferolateral, anterolateral walls, all of the apical segments and in the true apex. Subtraction (SDS):  Small area, mild severity reversible defect in the mid inferolateral wall. (SDS 2). Transient Ischemic Dilatation (Normal <1.22):  1.03 Lung/Heart Ratio (Normal <0.45):  0.41  Quantitative Gated Spect Images QGS EDV:  238 ml QGS ESV:  203 ml  Impression Exercise Capacity:  Adenosine study with no exercise. BP Response:  Normal blood pressure response. Clinical Symptoms:  No significant symptoms noted. ECG Impression:  No significant ST segment change suggestive of ischemia. Comparison with Prior Nuclear Study: No significant change from previous study  Overall Impression:  High risk stress nuclear study with severely dilated let ventricle with severely impaired systolic function and a large scar in the entire RCA, LCX and distal LAD territory. Minimal ischemia in the mid anterolateral wall. (SDS 2).  LV Ejection Fraction: 15%.  LV Wall Motion:  Hypokinesis of the basal and mid anterior, anterolateral walls, the rest of the ventricle is akinetic.    [name] 02/25/2014

## 2014-02-27 ENCOUNTER — Telehealth: Payer: Self-pay | Admitting: Internal Medicine

## 2014-02-27 MED ORDER — ALPRAZOLAM 0.25 MG PO TABS: 0.2500 mg | ORAL_TABLET | Freq: Two times a day (BID) | ORAL | Status: DC | PRN

## 2014-02-27 NOTE — Telephone Encounter (Signed)
Pt called in said that he is recovering from a bad illness and cant sleep.  Wanted to see of Dr Macario Goldsplot could call in something for sleeping

## 2014-02-27 NOTE — Telephone Encounter (Signed)
Pls call in Xanax prn - see meds Thx

## 2014-02-27 NOTE — Telephone Encounter (Signed)
Called pharmacy spoke with Rosanne AshingJim gave md authorization for xanax. Caled pt no answer LMOM medication sent to [facility],,,/lmb

## 2014-02-28 ENCOUNTER — Encounter: Payer: Self-pay | Admitting: Pulmonary Disease

## 2014-02-28 ENCOUNTER — Ambulatory Visit (INDEPENDENT_AMBULATORY_CARE_PROVIDER_SITE_OTHER): Payer: Medicare Other | Admitting: Pulmonary Disease

## 2014-02-28 VITALS — BP 120/82 | HR 80 | Ht 73.0 in | Wt 182.8 lb

## 2014-02-28 DIAGNOSIS — G4730 Sleep apnea, unspecified: Secondary | ICD-10-CM | POA: Insufficient documentation

## 2014-02-28 NOTE — Progress Notes (Signed)
Subjective:    Patient ID: [name], male    DOB: 1926-08-31, 81 y.o.   MRN: [id number]  HPI  81 year old , retired Forensic scientist ,with severe ischemic cardiomyopathy presents for evaluation of sleep-disordered breathing. He is accompanied by his daughter. He reports 2 pillow orthopnea for many years, systolic CHF since 2010, he is maintained on anticoagulation for atrial fibrillation and left atrial clot, TEE and 11/2013 showed EF of 15% with smoke in the left atrium. He reports choking and gagging episodes in his sleep that wake him up, he also reports occasional apneas-although no bed partner history is available-his wife is apparently ill. This was also observed in the hospital, he did not qualify for oxygen, but has obtained his own oxygen tank-and he feels slightly better rested using this during sleep. He is afraid of getting addicted to oxygen.  Bedtime is around 11 PM, sleep latency is variable, he sleeps on his back or his side with 2 pillows, reports 2-3 nocturnal awakenings including bathroom visits, is out of bed by 6 AM feeling tired with occasional dryness of mouth. Epworth sleepiness score is 2-he denies daytime naps.   Past Medical History  Diagnosis Date  . Atrial fibrillation     AFIB  . Ischemic cardiomyopathy     CABG in 1991; last catheter 2005 with patent vein graft to PD, diagonal, marginal/echo 2010 EF 25%.  . implantable cardiac defibrillator -single chamber[V45.02]     Environmental manager  . Abdominal aneurysm without mention of rupture     s/p stent graft with endoleak requiring restenting-[facility]  . Chronic systolic heart failure     SYSTOLIC .Marland KitchenACUTE ON CHRONIC  . Hypertension   . Peripheral vascular disease, unspecified   . Hemoptysis   . Acute prostatitis   . Labyrinthitis, unspecified   . Unspecified hearing loss   . Cervicalgia   . Stroke   . CKD (chronic kidney disease), stage III   . Chronic anticoagulation     Coumadin   Past Surgical  History  Procedure Laterality Date  . Insert / replace / remove pacemaker  2006    AICD GUIDANT VITALITY..IN SITU  . Colectomy  07/15/96  . Colostomy  07/15/96    COLOSTOMY TAKEN DOWN 01/14/97  . Coronary artery bypass graft  03/22/89  . Cardiac catheterization  2001    ANGIOPLASTY W/STENT RCA  . Abdominal aortic aneurysm repair  05/1999    STENT, ENDOVASCULAR RELINING OF GRAFT 3/11  . Cardioversion    . Tee without cardioversion N/A 12/12/2013    Procedure: TRANSESOPHAGEAL ECHOCARDIOGRAM (TEE);  Surgeon: [name], MD;  Location: [facility] END[facility]COPY;  Service: Cardiovascular;  Laterality: N/A;  . Cardioversion N/A 01/06/2014    Procedure: CARDIOVERSION;  Surgeon: Laurey Morale, MD;  Location: [facility] ENDOSCOPY;  Service: Cardiovascular;  Laterality: N/A;  . Implantable cardioverter defibrillator (icd) generator change N/A 01/18/2011    Procedure: ICD GENERATOR CHANGE;  Surgeon: Duke Salvia, MD;  Location: [facility] CATH LAB;  Service: Cardiovascular;  Laterality: N/A;  . Cardioversion N/A 02/10/2014    Procedure: CARDIOVERSION;  Surgeon: Quintella Reichert, MD;  Location: [facility] ENDOSCOPY;  Service: Cardiovascular;  Laterality: N/A;    Allergies  Allergen Reactions  . Benazepril     cough  . Promethazine Hcl Other (See Comments)    REACTION: world spins..    History   Social History  . Marital Status: Married    Spouse Name: N/A    Number of Children: 3  .  Years of Education: 16   Occupational History  . CHEMICAL ENGINEERING     [employer]   Social History Main Topics  . Smoking status: Former Smoker -- 22 years    Quit date: 02/15/1971  . Smokeless tobacco: Not on file  . Alcohol Use: No  . Drug Use: No  . Sexual Activity: Not on file   Other Topics Concern  . 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 MARRIED IN [address] FOR 7248 YRS WIDOWED THEN REMARRIED IN 4702. 1 SON , 2 DAUGHTERS. 8  GRANDCHILDREN. ACP: HCPOA - Lezlie OctaveValerie T. Smith (daughter) (c) . ACP - With an ICD In the event of electrical dissociation and cessation of heart function no further resuscitation. patient does not want to be maintained in a vegative state; does not want to be maintained with prolonged artificial hydration or nutrition; would not want HD; would not want prolonged mechanical ventilation. Referred to [email or web address].            ICD[employer]      DESIGNATED PARTY RELEASE ON FILE: LATOYA BATTLE September 01, 2009 3:26 PM    Family History  Problem Relation Age of Onset  . Stroke Mother   . Heart attack Father     X 6  . Heart disease Father   . Arthritis Sister     RHEUMATOID  . Heart disease Brother       Review of Systems  Constitutional: Negative for fever and unexpected weight change.  HENT: Negative for congestion, dental problem, ear pain, nosebleeds, postnasal drip, rhinorrhea, sinus pressure, sneezing, sore throat and trouble swallowing.   Eyes: Negative for redness and itching.  Respiratory: Positive for apnea. Negative for cough, chest tightness, shortness of breath and wheezing.   Cardiovascular: Negative for palpitations and leg swelling.  Gastrointestinal: Negative for nausea and vomiting.  Genitourinary: Negative for dysuria.  Musculoskeletal: Negative for joint swelling.  Skin: Negative for rash.  Neurological: Negative for headaches.  Hematological: Does not bruise/bleed easily.  Psychiatric/Behavioral: Negative for dysphoric mood. The patient is not nervous/anxious.        Objective:   Physical Exam  Gen. Pleasant, well-nourished, in no distress, normal affect ENT - no lesions, no post nasal drip Neck: No JVD, no thyromegaly, no carotid bruits Lungs: no use of accessory muscles, no dullness to percussion, decreased without rales or rhonchi  Cardiovascular: Rhythm regular, heart sounds  normal, no murmurs or gallops, no peripheral edema Abdomen:  soft and non-tender, no hepatosplenomegaly, BS normal. Musculoskeletal: No deformities, no cyanosis or clubbing Neuro:  alert, non focal       Assessment & Plan:

## 2014-02-28 NOTE — Patient Instructions (Signed)
Schedule sleep study - obstructive vs central apnea

## 2014-02-28 NOTE — Assessment & Plan Note (Signed)
The pathophysiology of obstructive sleep apnea , it's cardiovascular consequences & modes of treatment including CPAP were discused with the patient in detail & they evidenced understanding. We discussed the possibility of central sleep apnea, given his severe LV dysfunction. As such, it would be better to proceed with an attended polysomnogram. I reassured him that he would not get addicted to oxygen if he continued to use it during sleep

## 2014-03-06 ENCOUNTER — Encounter: Payer: Self-pay | Admitting: *Deleted

## 2014-03-07 ENCOUNTER — Encounter: Payer: Medicare Other | Admitting: Internal Medicine

## 2014-03-13 ENCOUNTER — Encounter: Payer: Self-pay | Admitting: Internal Medicine

## 2014-03-13 ENCOUNTER — Other Ambulatory Visit (INDEPENDENT_AMBULATORY_CARE_PROVIDER_SITE_OTHER): Payer: Medicare Other

## 2014-03-13 ENCOUNTER — Ambulatory Visit (INDEPENDENT_AMBULATORY_CARE_PROVIDER_SITE_OTHER): Payer: Medicare Other | Admitting: Internal Medicine

## 2014-03-13 ENCOUNTER — Telehealth: Payer: Self-pay | Admitting: Internal Medicine

## 2014-03-13 VITALS — BP 108/62 | HR 76 | Resp 16 | Ht 73.0 in | Wt 187.0 lb

## 2014-03-13 DIAGNOSIS — I5020 Unspecified systolic (congestive) heart failure: Secondary | ICD-10-CM

## 2014-03-13 DIAGNOSIS — F329 Major depressive disorder, single episode, unspecified: Secondary | ICD-10-CM | POA: Insufficient documentation

## 2014-03-13 DIAGNOSIS — I255 Ischemic cardiomyopathy: Secondary | ICD-10-CM

## 2014-03-13 DIAGNOSIS — I482 Chronic atrial fibrillation: Secondary | ICD-10-CM

## 2014-03-13 DIAGNOSIS — N183 Chronic kidney disease, stage 3 unspecified: Secondary | ICD-10-CM

## 2014-03-13 DIAGNOSIS — E785 Hyperlipidemia, unspecified: Secondary | ICD-10-CM

## 2014-03-13 DIAGNOSIS — F32A Depression, unspecified: Secondary | ICD-10-CM | POA: Insufficient documentation

## 2014-03-13 DIAGNOSIS — I509 Heart failure, unspecified: Secondary | ICD-10-CM | POA: Insufficient documentation

## 2014-03-13 LAB — CBC WITH DIFFERENTIAL/PLATELET
Basophils Absolute: 0 10*3/uL (ref 0.0–0.1)
Basophils Relative: 0.3 % (ref 0.0–3.0)
Eosinophils Absolute: 0 10*3/uL (ref 0.0–0.7)
Eosinophils Relative: 0.6 % (ref 0.0–5.0)
HCT: 38.2 % — ABNORMAL LOW (ref 39.0–52.0)
Hemoglobin: 13 g/dL (ref 13.0–17.0)
LYMPHS ABS: 0.6 10*3/uL — AB (ref 0.7–4.0)
LYMPHS PCT: 8.5 % — AB (ref 12.0–46.0)
MCHC: 34.1 g/dL (ref 30.0–36.0)
MCV: 99.1 fl (ref 78.0–100.0)
MONOS PCT: 8.2 % (ref 3.0–12.0)
Monocytes Absolute: 0.6 10*3/uL (ref 0.1–1.0)
NEUTROS ABS: 6 10*3/uL (ref 1.4–7.7)
Neutrophils Relative %: 82.4 % — ABNORMAL HIGH (ref 43.0–77.0)
Platelets: 89 10*3/uL — ABNORMAL LOW (ref 150.0–400.0)
RBC: 3.86 Mil/uL — ABNORMAL LOW (ref 4.22–5.81)
RDW: 17.2 % — ABNORMAL HIGH (ref 11.5–15.5)
WBC: 7.3 10*3/uL (ref 4.0–10.5)

## 2014-03-13 LAB — HEPATIC FUNCTION PANEL
ALT: 29 U/L (ref 0–53)
AST: 30 U/L (ref 0–37)
Albumin: 3.5 g/dL (ref 3.5–5.2)
Alkaline Phosphatase: 107 U/L (ref 39–117)
Bilirubin, Direct: 0.7 mg/dL — ABNORMAL HIGH (ref 0.0–0.3)
TOTAL PROTEIN: 7 g/dL (ref 6.0–8.3)
Total Bilirubin: 1.4 mg/dL — ABNORMAL HIGH (ref 0.2–1.2)

## 2014-03-13 LAB — BASIC METABOLIC PANEL
BUN: 38 mg/dL — AB (ref 6–23)
CHLORIDE: 98 meq/L (ref 96–112)
CO2: 26 meq/L (ref 19–32)
Calcium: 9.3 mg/dL (ref 8.4–10.5)
Creatinine, Ser: 1.54 mg/dL — ABNORMAL HIGH (ref 0.40–1.50)
GFR: 45.77 mL/min — ABNORMAL LOW (ref >60.00)
Glucose, Bld: 118 mg/dL — ABNORMAL HIGH (ref 70–99)
POTASSIUM: 4.8 meq/L (ref 3.5–5.1)
Sodium: 132 mEq/L — ABNORMAL LOW (ref 135–145)

## 2014-03-13 LAB — MAGNESIUM: Magnesium: 2.3 mg/dL (ref 1.5–2.5)

## 2014-03-13 LAB — TSH: TSH: 1.02 u[IU]/mL (ref 0.35–4.50)

## 2014-03-13 LAB — BRAIN NATRIURETIC PEPTIDE: Pro B Natriuretic peptide (BNP): 1237 pg/mL — ABNORMAL HIGH (ref 0.0–100.0)

## 2014-03-13 MED ORDER — TORSEMIDE 20 MG PO TABS: 20.0000 mg | ORAL_TABLET | Freq: Every day | ORAL | Status: DC

## 2014-03-13 NOTE — Progress Notes (Signed)
Subjective:   HPI  C/o leg swelling. He is here w/dtr-in-law, a flight attendant [place]. [place].  C/o unsteady gait.  Using O2 at night.  He stopped Digoxin due to ?upset stomach in 12/15  F/u fatigue - not better. F/u depression, insomnia, apathy. C/o dry cough since summer - worse...  Med changes reviewed.  02/10/14 - Successful cardioversion of atrial fibrillation - feeling better...  F/u vertigo - resolved, fatigue - better  The patient presents for a follow-up of CHF, A fib, pacemaker  chronic hypertension, chronic dyslipidemia controlled with medicines  F/u  AAA - Dr Bennie PieriniMark Farber - a complex long hx - STENT and STENT repair in 2011  Wt Readings from Last 3 Encounters:  03/13/14 187 lb (84.823 kg)  02/28/14 182 lb 12.8 oz (82.918 kg)  02/25/14 182 lb (82.555 kg)   BP Readings from Last 3 Encounters:  03/13/14 108/62  02/28/14 120/82  02/25/14 104/65       Review of Systems  Constitutional: Negative for appetite change, fatigue and unexpected weight change.  HENT: Positive for hearing loss. Negative for congestion, nosebleeds, sneezing, sore throat and trouble swallowing.   Eyes: Negative for itching and visual disturbance.  Respiratory: Negative for cough.   Cardiovascular: Negative for chest pain, palpitations and leg swelling.  Gastrointestinal: Negative for nausea, diarrhea, blood in stool and abdominal distention.  Genitourinary: Negative for frequency and hematuria.  Musculoskeletal: Negative for back pain, joint swelling, gait problem and neck pain.  Skin: Negative for rash.  Neurological: Negative for dizziness, tremors, speech difficulty and weakness.  Psychiatric/Behavioral: Negative for suicidal ideas, sleep disturbance, dysphoric mood and agitation. The patient is not nervous/anxious.        Objective:   Physical Exam  Constitutional: He is oriented to person, place, and time. He appears well-developed. No  distress.  NAD  HENT:  Mouth/Throat: Oropharynx is clear and moist.  Eyes: Conjunctivae are normal. Pupils are equal, round, and reactive to light.  Neck: Normal range of motion. No JVD present. No thyromegaly present.  Cardiovascular: Normal rate, regular rhythm, normal heart sounds and intact distal pulses.  Exam reveals no gallop and no friction rub.   No murmur heard. Pulmonary/Chest: Effort normal and breath sounds normal. No respiratory distress. He has no wheezes. He has no rales. He exhibits no tenderness.  Abdominal: Soft. Bowel sounds are normal. He exhibits no distension and no mass. There is no tenderness. There is no rebound and no guarding.  Musculoskeletal: Normal range of motion. He exhibits no edema or tenderness.  Lymphadenopathy:    He has no cervical adenopathy.  Neurological: He is alert and oriented to person, place, and time. He has normal reflexes. No cranial nerve deficit. He exhibits normal muscle tone. He displays a negative Romberg sign. Coordination and gait normal.  Skin: Skin is warm and dry. No rash noted.  Psychiatric: He has a normal mood and affect. His behavior is normal. Judgment and thought content normal.  Looks tired L knee with no prepatellar redness Weak legs, unsteady gait 2+ edema B  Abd CT angio 01/12/09  IMPRESSION:  1. No definite endovascular leak is currently seen.  2. However, the native aneurysm sac has increased slightly in  size, now measuring 56 x 60 mm compared to 55 x 55 mm previously.  3. Incidental gallstones.  CTA PELVIS  Findings: The iliac limbs of the AAA stent opacify with no  significant abnormality noted. Both the internal and  external  iliac arteries opacify. The urinary bladder is slightly thick-  walled. The prostate is moderately prominent. Postop changes from  prior left hemicolectomy are again noted.  Review of the MIP images confirms the above findings.  IMPRESSION:  1. Stable iliac limbs of AAA stent.  2.  Probable some degree of bladder outlet obstruction with  moderately enlarged prostate.     Recent procedures reviewed    Assessment & Plan:

## 2014-03-13 NOTE — Assessment & Plan Note (Addendum)
Worse Start O2 Change to Demadex May need to re-start Digoxin Labs RTC 1-2 wks [facility]

## 2014-03-13 NOTE — Assessment & Plan Note (Addendum)
Counseling offered Discussed

## 2014-03-13 NOTE — Assessment & Plan Note (Signed)
[facility] May need to re-start Digoxin Labs RTC 1-2 wks [facility]

## 2014-03-13 NOTE — Telephone Encounter (Signed)
Spoke to son and he is concerned that pt is not on any type of blood thinner and has a dx of CHF.   Last cardiology note: His nausea is concerning in the context of digoxin therapy; we will discontinue it  Is there another rx that can be used and/or an antiemetic for the sx of nausea.

## 2014-03-13 NOTE — Telephone Encounter (Signed)
He is on Eliquis - it is a blood thinner Thx

## 2014-03-13 NOTE — Assessment & Plan Note (Signed)
Labs

## 2014-03-13 NOTE — Telephone Encounter (Signed)
Pt son called in requesting call back from nurse   Francene BoyersJack Gatley Son [phone]

## 2014-03-14 ENCOUNTER — Telehealth: Payer: Self-pay | Admitting: Internal Medicine

## 2014-03-14 NOTE — Telephone Encounter (Signed)
Pt son informed.  Cardiology appt next Friday. Pt son satisfied.

## 2014-03-14 NOTE — Telephone Encounter (Signed)
Signed order faxed. Pt informed

## 2014-03-14 NOTE — Telephone Encounter (Signed)
Pt daughter called in asked if we could fax over a order to [facility] for a 3 in 1 bedside potty chair?

## 2014-03-17 ENCOUNTER — Telehealth: Payer: Self-pay | Admitting: Internal Medicine

## 2014-03-17 NOTE — Telephone Encounter (Signed)
Reporting level 1 potential sever drug interaction w/ zocor and ranexa. She states that it causes myopathy. He does not report any muscle pain. Admitted to home health yesterday

## 2014-03-17 NOTE — Telephone Encounter (Signed)
Noted. F/u w/Card I'll f/u Thx

## 2014-03-21 ENCOUNTER — Ambulatory Visit (INDEPENDENT_AMBULATORY_CARE_PROVIDER_SITE_OTHER): Payer: Medicare Other | Admitting: Internal Medicine

## 2014-03-21 ENCOUNTER — Encounter: Payer: Self-pay | Admitting: Internal Medicine

## 2014-03-21 VITALS — BP 110/70 | HR 69 | Temp 98.3°F | Wt 184.0 lb

## 2014-03-21 DIAGNOSIS — I4891 Unspecified atrial fibrillation: Secondary | ICD-10-CM

## 2014-03-21 DIAGNOSIS — I255 Ischemic cardiomyopathy: Secondary | ICD-10-CM

## 2014-03-21 DIAGNOSIS — I5022 Chronic systolic (congestive) heart failure: Secondary | ICD-10-CM

## 2014-03-21 NOTE — Progress Notes (Signed)
Pre visit review using our clinic review tool, if applicable. No additional management support is needed unless otherwise documented below in the visit note. 

## 2014-03-21 NOTE — Assessment & Plan Note (Addendum)
Continue with current prescription therapy as reflected on the Med list. Use portable O2

## 2014-03-21 NOTE — Progress Notes (Signed)
Subjective:   HPI  F/u leg swelling. 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 His wife was moved to [place].  F/u unsteady gait.  Using O2 at night.  He stopped Digoxin due to ?upset stomach in 12/15  F/u fatigue - not better. F/u depression, insomnia, apathy. C/o dry cough since summer - worse...  Med changes reviewed.  02/10/14 - Successful cardioversion of atrial fibrillation - feeling better...  F/u vertigo - resolved, fatigue - better  The patient presents for a follow-up of CHF, A fib, pacemaker  chronic hypertension, chronic dyslipidemia controlled with medicines  F/u  AAA - Dr Bennie Pierini - a complex long hx - STENT and STENT repair in 2011  Wt Readings from Last 3 Encounters:  03/21/14 184 lb (83.462 kg)  03/13/14 187 lb (84.823 kg)  02/28/14 182 lb 12.8 oz (82.918 kg)   BP Readings from Last 3 Encounters:  03/21/14 110/70  03/13/14 108/62  02/28/14 120/82       Review of Systems  Constitutional: Negative for appetite change, fatigue and unexpected weight change.  HENT: Positive for hearing loss. Negative for congestion, nosebleeds, sneezing, sore throat and trouble swallowing.   Eyes: Negative for itching and visual disturbance.  Respiratory: Negative for cough.   Cardiovascular: Negative for chest pain, palpitations and leg swelling.  Gastrointestinal: Negative for nausea, diarrhea, blood in stool and abdominal distention.  Genitourinary: Negative for frequency and hematuria.  Musculoskeletal: Negative for back pain, joint swelling, gait problem and neck pain.  Skin: Negative for rash.  Neurological: Negative for dizziness, tremors, speech difficulty and weakness.  Psychiatric/Behavioral: Negative for suicidal ideas, sleep disturbance, dysphoric mood and agitation. The patient is not nervous/anxious.        Objective:   Physical Exam  Constitutional: He is oriented to person, place, and time. He appears well-developed. No  distress.  NAD  HENT:  Mouth/Throat: Oropharynx is clear and moist.  Eyes: Conjunctivae are normal. Pupils are equal, round, and reactive to light.  Neck: Normal range of motion. No JVD present. No thyromegaly present.  Cardiovascular: Normal rate, regular rhythm, normal heart sounds and intact distal pulses.  Exam reveals no gallop and no friction rub.   No murmur heard. Pulmonary/Chest: Effort normal and breath sounds normal. No respiratory distress. He has no wheezes. He has no rales. He exhibits no tenderness.  Abdominal: Soft. Bowel sounds are normal. He exhibits no distension and no mass. There is no tenderness. There is no rebound and no guarding.  Musculoskeletal: Normal range of motion. He exhibits no edema or tenderness.  Lymphadenopathy:    He has no cervical adenopathy.  Neurological: He is alert and oriented to person, place, and time. He has normal reflexes. No cranial nerve deficit. He exhibits normal muscle tone. He displays a negative Romberg sign. Coordination and gait normal.  Skin: Skin is warm and dry. No rash noted.  Psychiatric: He has a normal mood and affect. His behavior is normal. Judgment and thought content normal.  Looks less tired L knee with no prepatellar redness 1+ edema B  Abd CT angio 01/12/09  IMPRESSION:  1. No definite endovascular leak is currently seen.  2. However, the native aneurysm sac has increased slightly in  size, now measuring 56 x 60 mm compared to 55 x 55 mm previously.  3. Incidental gallstones.  CTA PELVIS  Findings: The iliac limbs of the AAA stent opacify with no  significant abnormality noted. Both the internal and external  iliac  arteries opacify. The urinary bladder is slightly thick-  walled. The prostate is moderately prominent. Postop changes from  prior left hemicolectomy are again noted.  Review of the MIP images confirms the above findings.  IMPRESSION:  1. Stable iliac limbs of AAA stent.  2. Probable some degree of  bladder outlet obstruction with  moderately enlarged prostate.      Assessment & Plan:  Patient ID: [name], male   DOB: 05-04-1926, 81 y.o.   MRN: [id number]

## 2014-03-21 NOTE — Assessment & Plan Note (Signed)
In NSR Continue with current prescription therapy as reflected on the Med list.  

## 2014-03-22 ENCOUNTER — Encounter: Payer: Self-pay | Admitting: Internal Medicine

## 2014-03-26 DIAGNOSIS — R627 Adult failure to thrive: Secondary | ICD-10-CM | POA: Diagnosis not present

## 2014-03-26 DIAGNOSIS — N183 Chronic kidney disease, stage 3 (moderate): Secondary | ICD-10-CM

## 2014-03-26 DIAGNOSIS — I4891 Unspecified atrial fibrillation: Secondary | ICD-10-CM | POA: Diagnosis not present

## 2014-03-26 DIAGNOSIS — I509 Heart failure, unspecified: Secondary | ICD-10-CM | POA: Diagnosis not present

## 2014-03-27 ENCOUNTER — Telehealth: Payer: Self-pay | Admitting: Internal Medicine

## 2014-03-27 NOTE — Telephone Encounter (Signed)
New Msg       [name] nurse from [facility] calling, states [name] would like to know if he has definitely been approved to see Dr. Johney FrameAllred for his defib chk moving forward.  [name] had consult with Dr. Johney FrameAllred that was cancelled due to snow and wants to know if he has been switched from Dr. Graciela HusbandsKlein to Dr. Johney FrameAllred?   Please return call to [name] and advise.

## 2014-03-27 NOTE — Telephone Encounter (Signed)
error 

## 2014-03-27 NOTE — Telephone Encounter (Signed)
Patient was down to see Dr Johney FrameAllred per Dr Odessa FlemingKlein's request to discuss possible afib ablation.  We will reschedule that and he will continue to have his regular device follow ups with Dr Osie BondKlein  Melissa will call to reschedule

## 2014-04-01 ENCOUNTER — Other Ambulatory Visit: Payer: Self-pay | Admitting: Internal Medicine

## 2014-04-01 ENCOUNTER — Telehealth: Payer: Self-pay | Admitting: Internal Medicine

## 2014-04-01 NOTE — Telephone Encounter (Signed)
Pt called in and wanted to know if he is suppose to still be taking the simvastatin (ZOCOR) 40 MG tablet [[id number]][[id number]].  I  If so can a refill be sent to [facility] on file?

## 2014-04-02 ENCOUNTER — Other Ambulatory Visit: Payer: Self-pay | Admitting: Geriatric Medicine

## 2014-04-02 MED ORDER — SIMVASTATIN 40 MG PO TABS: 20.0000 mg | ORAL_TABLET | Freq: Every evening | ORAL | Status: DC

## 2014-04-02 NOTE — Telephone Encounter (Signed)
Do not take it yet - will discuss at nthe next OV. Simvastatin may cause leg weakness and upset stomach. Thx

## 2014-04-03 NOTE — Telephone Encounter (Signed)
Called pt no answer LMOM with md response.../lmb 

## 2014-04-08 ENCOUNTER — Telehealth: Payer: Self-pay | Admitting: Internal Medicine

## 2014-04-08 NOTE — Telephone Encounter (Signed)
[facility] if [facility] is suppose to be taking Zocar, please advise, and also he has had the stomach bug and vertigo.

## 2014-04-09 ENCOUNTER — Encounter: Payer: Self-pay | Admitting: Internal Medicine

## 2014-04-09 ENCOUNTER — Ambulatory Visit (INDEPENDENT_AMBULATORY_CARE_PROVIDER_SITE_OTHER): Payer: Medicare Other | Admitting: Internal Medicine

## 2014-04-09 VITALS — BP 110/68 | HR 70 | Ht 73.0 in | Wt 182.2 lb

## 2014-04-09 DIAGNOSIS — I481 Persistent atrial fibrillation: Secondary | ICD-10-CM

## 2014-04-09 DIAGNOSIS — I5022 Chronic systolic (congestive) heart failure: Secondary | ICD-10-CM

## 2014-04-09 DIAGNOSIS — I255 Ischemic cardiomyopathy: Secondary | ICD-10-CM

## 2014-04-09 DIAGNOSIS — I4819 Other persistent atrial fibrillation: Secondary | ICD-10-CM

## 2014-04-09 NOTE — Telephone Encounter (Signed)
Pt informed

## 2014-04-09 NOTE — Patient Instructions (Signed)
Your physician recommends that you schedule a follow-up appointment in: 4 weeks with Dr Allred.  

## 2014-04-09 NOTE — Telephone Encounter (Signed)
Do not take Zocor yet.  Thx

## 2014-04-10 ENCOUNTER — Telehealth: Payer: Self-pay | Admitting: Internal Medicine

## 2014-04-10 DIAGNOSIS — R319 Hematuria, unspecified: Secondary | ICD-10-CM

## 2014-04-10 NOTE — Telephone Encounter (Signed)
States he is having blood in his urine.  He is coming in the morning for labs.  He wants to know if there will be a urine culture done.

## 2014-04-10 NOTE — Progress Notes (Signed)
Electrophysiology Office Note   Date:  04/10/2014   ID:  [name], DOB Sep 25, 1926, MRN [id number]  PCP:  Sonda Primes, MD  Primary Electrophysiologist:  Dr Graciela Husbands  Chief Complaint  Patient presents with  . Follow-up    Persistent AFIB     History of Present Illness: [name] is a 81 y.o. male who presents today for electrophysiology evaluation.   He has multiple comorbidities including vascular disease, CAD, ischemic cm, and renal failure.  He is followed by Dr Graciela Husbands.  He presents today for further afib evaluation.  He has had afib for quite some time.  He has failed medical therapy with tikosyn and ranexa.  He has symptoms of fatigue and decreased exercise tolerance.  He has required prior cardioversion.  In October he was found to have a large LA appendage thrombus in the setting of subtherapeutic INR.  He was therefore placed on eliquis.  He was successfully cardioverted in November but has since returned to afib.   Today, he denies symptoms of palpitations, chest pain, rthopnea, PND, lower extremity edema, claudication, dizziness, presyncope, syncope, bleeding, or neurologic sequela. The patient is tolerating medications without difficulties and is otherwise without complaint today.    Past Medical History  Diagnosis Date  . Atrial fibrillation     AFIB  . Ischemic cardiomyopathy     CABG in 1991; last catheter 2005 with patent vein graft to PD, diagonal, marginal/echo 2010 EF 25%.  . implantable cardiac defibrillator -single chamber[V45.02]     Environmental manager  . Abdominal aneurysm without mention of rupture     s/p stent graft with endoleak requiring restenting-[facility]  . Chronic systolic heart failure     SYSTOLIC .Marland KitchenACUTE ON CHRONIC  . Hypertension   . Peripheral vascular disease, unspecified   . Hemoptysis   . Acute prostatitis   . Labyrinthitis, unspecified   . Unspecified hearing loss   . Cervicalgia   . Stroke   . CKD (chronic kidney disease), stage III   .  Chronic anticoagulation     Coumadin   Past Surgical History  Procedure Laterality Date  . Insert / replace / remove pacemaker  2006    AICD GUIDANT VITALITY..IN SITU  . Colectomy  07/15/96  . Colostomy  07/15/96    COLOSTOMY TAKEN DOWN 01/14/97  . Coronary artery bypass graft  03/22/89  . Cardiac catheterization  2001    ANGIOPLASTY W/STENT RCA  . Abdominal aortic aneurysm repair  05/1999    STENT, ENDOVASCULAR RELINING OF GRAFT 3/11  . Cardioversion    . Tee without cardioversion N/A 12/12/2013    Procedure: TRANSESOPHAGEAL ECHOCARDIOGRAM (TEE);  Surgeon: [name], MD;  Location: [facility] ENDOSCOPY;  Service: Cardiovascular;  Laterality: N/A;  . Cardioversion N/A 01/06/2014    Procedure: CARDIOVERSION;  Surgeon: Laurey Morale, MD;  Location: [facility] ENDOSCOPY;  Service: Cardiovascular;  Laterality: N/A;  . Implantable cardioverter defibrillator (icd) generator change N/A 01/18/2011    Procedure: ICD GENERATOR CHANGE;  Surgeon: Duke Salvia, MD;  Location: [facility] CATH LAB;  Service: Cardiovascular;  Laterality: N/A;  . Cardioversion N/A 02/10/2014    Procedure: CARDIOVERSION;  Surgeon: Quintella Reichert, MD;  Location: [facility] ENDOSCOPY;  Service: Cardiovascular;  Laterality: N/A;     Current Outpatient Prescriptions  Medication Sig Dispense Refill  . acetaminophen (TYLENOL) 500 MG tablet Take 1,000 mg by mouth every morning.     Marland Kitchen apixaban (ELIQUIS) 2.5 MG TABS tablet Take 1 tablet (2.5 mg total)  by mouth 2 (two) times daily. 60 tablet 0  . Carboxymethylcellulose Sodium (REFRESH TEARS OP) Place 1-2 drops into both eyes daily as needed (dry eyes).    . carvedilol (COREG) 6.25 MG tablet Take 1 tablet (6.25 mg total) by mouth 2 (two) times daily with a meal. 60 tablet 11  . Copper Gluconate 2 MG TABS Take 2 mg by mouth daily as needed (joint pain).     Marland Kitchen. diclofenac sodium (VOLTAREN) 1 % GEL Apply 4 g topically 2 (two) times daily as needed (pain).    Marland Kitchen. digoxin (LANOXIN) 0.125 MG tablet TAKE 1/2 TABLET  ONCE DAILY (Patient taking differently: TAKE 1/2 TABLET BY MOUTH ONCE DAILY) 15 tablet 2  . dofetilide (TIKOSYN) 125 MCG capsule Take 125 mcg by mouth 2 (two) times daily.    Marland Kitchen. losartan (COZAAR) 50 MG tablet Take 1 tablet (50 mg total) by mouth daily. 30 tablet 5  . Magnesium 250 MG TABS Take 250 mg by mouth every evening.     . Multiple Vitamin (MULTIVITAMIN) tablet Take 1 tablet by mouth daily.     . nitroGLYCERIN (NITROSTAT) 0.4 MG SL tablet Place 0.4 mg under the tongue every 5 (five) minutes as needed for chest pain.     Bertram Gala. Polyethyl Glycol-Propyl Glycol (SYSTANE OP) Place 1-2 drops into both eyes daily as needed (dry eyes).     . ranolazine (RANEXA) 500 MG 12 hr tablet Take 1 tablet (500 mg total) by mouth 2 (two) times daily. 60 tablet 2  . sodium chloride (OCEAN) 0.65 % SOLN nasal spray Place 1 spray into both nostrils daily.    Marland Kitchen. torsemide (DEMADEX) 20 MG tablet Take 1-2 tablets (20-40 mg total) by mouth daily. 60 tablet 5   No current facility-administered medications for this visit.    Allergies:   Benazepril and Promethazine hcl   Social History:  The patient  reports that he quit smoking about 43 years ago. He does not have any smokeless tobacco history on file. He reports that he does not drink alcohol or use illicit drugs.   Family History:  The patient's family history includes Heart attack in his father; Heart disease in his brother and father; Rheum arthritis in his sister; Stroke in his mother.    ROS:  Please see the history of present illness.   All other systems are reviewed and negative.    PHYSICAL EXAM: VS:  BP 110/68 mmHg  Pulse 70  Ht 6\' 1"  (1.854 m)  Wt 182 lb 3.2 oz (82.645 kg)  BMI 24.04 kg/m2 , BMI Body mass index is 24.04 kg/(m^2). GEN: elderly and frail appearing, in no acute distress HEENT: normal Neck: no JVD, carotid bruits, or masses Cardiac: iRRR; no murmurs, rubs, or gallops,no edema  Respiratory:  clear to auscultation bilaterally, normal work  of breathing GI: soft, nontender, nondistended, + BS MS: diffuse muscle atrophy Skin: warm and dry, device pocket is well healed Neuro:  Strength and sensation are intact Psych: euthymic mood, full affect  EKG:  EKG is ordered today. The ekg ordered today shows rate controlled afib  Device interrogation is reviewed today in detail.  See PaceArt for details.   Recent Labs: 03/13/2014: ALT 29; BUN 38*; Creatinine 1.54*; Hemoglobin 13.0; Magnesium 2.3; Platelets 89.0*; Potassium 4.8; Pro B Natriuretic peptide (BNP) 1237.0*; Sodium 132*; TSH 1.02    Lipid Panel     Component Value Date/Time   CHOL 129 09/16/2013 0719   TRIG 85.0 09/16/2013 0719   TRIG  111 01/04/2006 0721   HDL 37.00* 09/16/2013 0719   CHOLHDL 3 09/16/2013 0719   CHOLHDL 4.3 CALC 01/04/2006 0721   VLDL 17.0 09/16/2013 0719   LDLCALC 75 09/16/2013 0719   LDLDIRECT 80.7 01/31/2012 1547     Wt Readings from Last 3 Encounters:  04/09/14 182 lb 3.2 oz (82.645 kg)  03/21/14 184 lb (83.462 kg)  03/13/14 187 lb (84.823 kg)      Other studies Reviewed: Additional studies/ records that were reviewed today include: Dr Koren Bound notes/ prior ekgs/ echos    ASSESSMENT AND PLAN:  1.  Persistent atrial fibrillation The patient has failed medical therapy with tikosyn and ranexa.  He has significant structural heart disease and likely atriopathy as well.  Given his advanced age and comorbidities, he is a very poor candidate for catheter ablation and success rates and low.  I would therefore advise either rate control long term or additional consideration of amiodarone.  We discussed this at length.  He had a friend who did not do well with amiodarone and is very much concerned about taking this medicine. I did offer him GENETIC AF as an alternative.  He and his son are quite interested in this option. I have therefore spoken with Phyllis Ginger with the [facility] who will proceed with screening for Genetic AF.  IF  he is not a candidate for genetic AF then he may be more willing to consider amiodarone long term. His chads2vasc score is at least 7.  He should therefore continue anticoagulation long term.  Continue eliquis which he is presently tolerating.  2. Ischemic CM/ CAD euvolemic today No ischemic symptoms No changes at this time  3. HTN Stable No change required today   The patient is chronically ill with multiple comorbidities.  Today, I have spent 40  minutes with the patient discussing afib management.  More than 50% of the visit time today was spent on this issue.  Hopefully once our long term afib plan is in place I can return his care in its entirety back to Dr Graciela Husbands.  Return for follow-up in several weeks.   Current medicines are reviewed at length with the patient today.   The patient does not have concerns regarding his medicines.  The following changes were made today:  none  Signed, Hillis Range, MD  04/10/2014 9:47 PM     [facility] [address] [place] [address] [phone] (office) [phone] (fax)

## 2014-04-11 ENCOUNTER — Other Ambulatory Visit (INDEPENDENT_AMBULATORY_CARE_PROVIDER_SITE_OTHER): Payer: PRIVATE HEALTH INSURANCE

## 2014-04-11 ENCOUNTER — Encounter (HOSPITAL_BASED_OUTPATIENT_CLINIC_OR_DEPARTMENT_OTHER): Payer: Medicare Other

## 2014-04-11 DIAGNOSIS — I4891 Unspecified atrial fibrillation: Secondary | ICD-10-CM

## 2014-04-11 DIAGNOSIS — R319 Hematuria, unspecified: Secondary | ICD-10-CM

## 2014-04-11 DIAGNOSIS — I5022 Chronic systolic (congestive) heart failure: Secondary | ICD-10-CM

## 2014-04-11 LAB — URINALYSIS, ROUTINE W REFLEX MICROSCOPIC
Hgb urine dipstick: NEGATIVE
KETONES UR: NEGATIVE
Leukocytes, UA: NEGATIVE
Nitrite: NEGATIVE
Specific Gravity, Urine: 1.01 (ref 1.000–1.030)
UROBILINOGEN UA: 1 (ref 0.0–1.0)
Urine Glucose: NEGATIVE
pH: 6 (ref 5.0–8.0)

## 2014-04-11 LAB — BASIC METABOLIC PANEL
BUN: 45 mg/dL — AB (ref 6–23)
CHLORIDE: 98 meq/L (ref 96–112)
CO2: 36 mEq/L — ABNORMAL HIGH (ref 19–32)
Calcium: 9.7 mg/dL (ref 8.4–10.5)
Creatinine, Ser: 1.91 mg/dL — ABNORMAL HIGH (ref 0.40–1.50)
GFR: 35.69 mL/min — AB (ref >60.00)
Glucose, Bld: 112 mg/dL — ABNORMAL HIGH (ref 70–99)
POTASSIUM: 4.6 meq/L (ref 3.5–5.1)
Sodium: 137 mEq/L (ref 135–145)

## 2014-04-11 LAB — BRAIN NATRIURETIC PEPTIDE: Pro B Natriuretic peptide (BNP): 1764 pg/mL — ABNORMAL HIGH (ref 0.0–100.0)

## 2014-04-11 NOTE — Telephone Encounter (Signed)
Labs entered. Pt informed  

## 2014-04-12 LAB — DIGOXIN LEVEL: Digoxin Level: 1 ng/mL (ref 0.8–2.0)

## 2014-04-13 LAB — CULTURE, URINE COMPREHENSIVE
COLONY COUNT: NO GROWTH
Organism ID, Bacteria: NO GROWTH

## 2014-04-14 ENCOUNTER — Emergency Department (HOSPITAL_COMMUNITY)
Admission: EM | Admit: 2014-04-14 | Discharge: 2014-04-15 | Disposition: A | Discharge status: Home / Self Care | Payer: Medicare Other | Attending: Emergency Medicine | Admitting: Emergency Medicine

## 2014-04-14 ENCOUNTER — Encounter (HOSPITAL_COMMUNITY): Payer: Self-pay | Admitting: Emergency Medicine

## 2014-04-14 ENCOUNTER — Emergency Department (HOSPITAL_COMMUNITY): Payer: Medicare Other

## 2014-04-14 DIAGNOSIS — Y939 Activity, unspecified: Secondary | ICD-10-CM | POA: Diagnosis not present

## 2014-04-14 DIAGNOSIS — Z79899 Other long term (current) drug therapy: Secondary | ICD-10-CM | POA: Insufficient documentation

## 2014-04-14 DIAGNOSIS — S80211A Abrasion, right knee, initial encounter: Secondary | ICD-10-CM | POA: Diagnosis not present

## 2014-04-14 DIAGNOSIS — S0083XA Contusion of other part of head, initial encounter: Secondary | ICD-10-CM

## 2014-04-14 DIAGNOSIS — N183 Chronic kidney disease, stage 3 (moderate): Secondary | ICD-10-CM | POA: Insufficient documentation

## 2014-04-14 DIAGNOSIS — Z95810 Presence of automatic (implantable) cardiac defibrillator: Secondary | ICD-10-CM | POA: Diagnosis not present

## 2014-04-14 DIAGNOSIS — Z951 Presence of aortocoronary bypass graft: Secondary | ICD-10-CM | POA: Diagnosis not present

## 2014-04-14 DIAGNOSIS — I4891 Unspecified atrial fibrillation: Secondary | ICD-10-CM | POA: Insufficient documentation

## 2014-04-14 DIAGNOSIS — Z8673 Personal history of transient ischemic attack (TIA), and cerebral infarction without residual deficits: Secondary | ICD-10-CM | POA: Diagnosis not present

## 2014-04-14 DIAGNOSIS — Z7901 Long term (current) use of anticoagulants: Secondary | ICD-10-CM | POA: Insufficient documentation

## 2014-04-14 DIAGNOSIS — I129 Hypertensive chronic kidney disease with stage 1 through stage 4 chronic kidney disease, or unspecified chronic kidney disease: Secondary | ICD-10-CM | POA: Diagnosis not present

## 2014-04-14 DIAGNOSIS — H9190 Unspecified hearing loss, unspecified ear: Secondary | ICD-10-CM | POA: Diagnosis not present

## 2014-04-14 DIAGNOSIS — W01198A Fall on same level from slipping, tripping and stumbling with subsequent striking against other object, initial encounter: Secondary | ICD-10-CM | POA: Diagnosis not present

## 2014-04-14 DIAGNOSIS — I5022 Chronic systolic (congestive) heart failure: Secondary | ICD-10-CM | POA: Diagnosis not present

## 2014-04-14 DIAGNOSIS — Y998 Other external cause status: Secondary | ICD-10-CM | POA: Diagnosis not present

## 2014-04-14 DIAGNOSIS — W19XXXA Unspecified fall, initial encounter: Secondary | ICD-10-CM

## 2014-04-14 DIAGNOSIS — S60511A Abrasion of right hand, initial encounter: Secondary | ICD-10-CM | POA: Diagnosis not present

## 2014-04-14 DIAGNOSIS — Z87891 Personal history of nicotine dependence: Secondary | ICD-10-CM | POA: Diagnosis not present

## 2014-04-14 DIAGNOSIS — Y9289 Other specified places as the place of occurrence of the external cause: Secondary | ICD-10-CM | POA: Diagnosis not present

## 2014-04-14 DIAGNOSIS — Z23 Encounter for immunization: Secondary | ICD-10-CM | POA: Insufficient documentation

## 2014-04-14 DIAGNOSIS — S0990XA Unspecified injury of head, initial encounter: Secondary | ICD-10-CM

## 2014-04-14 MED ORDER — TETANUS-DIPHTH-ACELL PERTUSSIS 5-2.5-18.5 LF-MCG/0.5 IM SUSP
0.5000 mL | Freq: Once | INTRAMUSCULAR | Status: AC
  Administered 2014-04-14: 0.5 mL via INTRAMUSCULAR
  Filled 2014-04-14: qty 0.5

## 2014-04-14 NOTE — Discharge Instructions (Signed)
Return to the ED with any concerns including vomiting, seizure activity, increased, not able to bear weight, increased redness around wound, pus draining, decreased level of alertness/lethargy, or any other alarming symptoms

## 2014-04-14 NOTE — ED Notes (Signed)
From home via EMS, mechanical fall with no LOC, hematoma above right eye, no other injuries, taking blood thinners, A/O and in NAD

## 2014-04-14 NOTE — ED Provider Notes (Signed)
CSN: [id number]     Arrival date & time 04/14/14  2042 History   First MD Initiated Contact with Patient 04/14/14 2052     Chief Complaint  Patient presents with  . Fall     (Consider location/radiation/quality/duration/timing/severity/associated sxs/prior Treatment) HPI  Pt presenting with c/o fall.  He has hx of afib and takes eliquis.  He states he was taking out the garbage can tonight- he tripped and fell on his driveway, hitting his head.  No LOC, no vomiting or seizure activity.  He states he scraped his right hand and knee but has minimal pain there- 1/10.  Denies neck or back pain.  Fall happened just prior to arrival.  He has had no treatment prior to arrival. Denies palpitations, lightheadedness, vertigo prior to the fall.  There are no other associated systemic symptoms, there are no other alleviating or modifying factors.   Past Medical History  Diagnosis Date  . Atrial fibrillation     AFIB  . Ischemic cardiomyopathy     CABG in 1991; last catheter 2005 with patent vein graft to PD, diagonal, marginal/echo 2010 EF 25%.  . implantable cardiac defibrillator -single chamber[V45.02]     Environmental manager  . Abdominal aneurysm without mention of rupture     s/p stent graft with endoleak requiring restenting-[facility]  . Chronic systolic heart failure     SYSTOLIC .Marland KitchenACUTE ON CHRONIC  . Hypertension   . Peripheral vascular disease, unspecified   . Hemoptysis   . Acute prostatitis   . Labyrinthitis, unspecified   . Unspecified hearing loss   . Cervicalgia   . Stroke   . CKD (chronic kidney disease), stage III   . Chronic anticoagulation     Coumadin   Past Surgical History  Procedure Laterality Date  . Insert / replace / remove pacemaker  2006    AICD GUIDANT VITALITY..IN SITU  . Colectomy  07/15/96  . Colostomy  07/15/96    COLOSTOMY TAKEN DOWN 01/14/97  . Coronary artery bypass graft  03/22/89  . Cardiac catheterization  2001    ANGIOPLASTY W/STENT RCA  . Abdominal aortic  aneurysm repair  05/1999    STENT, ENDOVASCULAR RELINING OF GRAFT 3/11  . Cardioversion    . Tee without cardioversion N/A 12/12/2013    Procedure: TRANSESOPHAGEAL ECHOCARDIOGRAM (TEE);  Surgeon: [name], MD;  Location: [facility] ENDOSCOPY;  Service: Cardiovascular;  Laterality: N/A;  . Cardioversion N/A 01/06/2014    Procedure: CARDIOVERSION;  Surgeon: Laurey Morale, MD;  Location: [facility] ENDOSCOPY;  Service: Cardiovascular;  Laterality: N/A;  . Implantable cardioverter defibrillator (icd) generator change N/A 01/18/2011    Procedure: ICD GENERATOR CHANGE;  Surgeon: Duke Salvia, MD;  Location: [facility] CATH LAB;  Service: Cardiovascular;  Laterality: N/A;  . Cardioversion N/A 02/10/2014    Procedure: CARDIOVERSION;  Surgeon: Quintella Reichert, MD;  Location: [facility] ENDOSCOPY;  Service: Cardiovascular;  Laterality: N/A;   Family History  Problem Relation Age of Onset  . Stroke Mother   . Heart attack Father     X 6  . Heart disease Father   . Rheum arthritis Sister   . Heart disease Brother    History  Substance Use Topics  . Smoking status: Former Smoker -- 22 years    Quit date: 02/15/1971  . Smokeless tobacco: Not on file  . Alcohol Use: No    Review of Systems  ROS reviewed and all otherwise negative except for mentioned in HPI    Allergies  Benazepril and Promethazine hcl  Home Medications   Prior to Admission medications   Medication Sig Start Date End Date Taking? Authorizing Provider  acetaminophen (TYLENOL) 500 MG tablet Take 1,000 mg by mouth every morning.     Historical Provider, MD  apixaban (ELIQUIS) 2.5 MG TABS tablet Take 1 tablet (2.5 mg total) by mouth 2 (two) times daily. 12/16/13   Duke SalviaSteven C Klein, MD  Carboxymethylcellulose Sodium (REFRESH TEARS OP) Place 1-2 drops into both eyes daily as needed (dry eyes).    Historical Provider, MD  carvedilol (COREG) 6.25 MG tablet Take 1 tablet (6.25 mg total) by mouth 2 (two) times daily with a meal. 12/13/13   Rhonda G  Barrett, PA-C  Copper Gluconate 2 MG TABS Take 2 mg by mouth daily as needed (joint pain).     Historical Provider, MD  diclofenac sodium (VOLTAREN) 1 % GEL Apply 4 g topically 2 (two) times daily as needed (pain).    Historical Provider, MD  digoxin (LANOXIN) 0.125 MG tablet TAKE 1/2 TABLET ONCE DAILY Patient taking differently: TAKE 1/2 TABLET BY MOUTH ONCE DAILY 04/01/14   Aleksei Plotnikov V, MD  dofetilide (TIKOSYN) 125 MCG capsule Take 125 mcg by mouth 2 (two) times daily.    Historical Provider, MD  losartan (COZAAR) 50 MG tablet Take 1 tablet (50 mg total) by mouth daily. 02/11/14   Aleksei Plotnikov V, MD  Magnesium 250 MG TABS Take 250 mg by mouth every evening.     Historical Provider, MD  Multiple Vitamin (MULTIVITAMIN) tablet Take 1 tablet by mouth daily.     Historical Provider, MD  nitroGLYCERIN (NITROSTAT) 0.4 MG SL tablet Place 0.4 mg under the tongue every 5 (five) minutes as needed for chest pain.     Historical Provider, MD  Polyethyl Glycol-Propyl Glycol (SYSTANE OP) Place 1-2 drops into both eyes daily as needed (dry eyes).     Historical Provider, MD  ranolazine (RANEXA) 500 MG 12 hr tablet Take 1 tablet (500 mg total) by mouth 2 (two) times daily. 02/04/14   Duke SalviaSteven C Klein, MD  sodium chloride (OCEAN) 0.65 % SOLN nasal spray Place 1 spray into both nostrils daily.    Historical Provider, MD  torsemide (DEMADEX) 20 MG tablet Take 1-2 tablets (20-40 mg total) by mouth daily. 03/13/14   Aleksei Plotnikov V, MD   BP 104/85 mmHg  Pulse 78  Temp(Src) 97.4 F (36.3 C) (Oral)  Resp 22  SpO2 100%  Vitals reviewed Physical Exam  Physical Examination: General appearance - alert, well appearing, and in no distress Mental status - alert, oriented to person, place, and time Eyes - pupils equal and reactive, extraocular eye movements intact Head- hematoma and abrasion overlying right forehead/brow Mouth - mucous membranes moist, pharynx normal without lesions Neck - supple, no  significant adenopathy, no midline tenderness to palpation Chest - clear to auscultation, no wheezes, rales or rhonchi, symmetric air entry Heart - normal rate, regular rhythm, normal S1, S2, no murmurs, rubs, clicks or gallops Abdomen - soft, nontender, nondistended, no masses or organomegaly Neurological - alert, oriented x 3, no cranial nerve deficit, strength 5/5 in extremities x 4, sensation intact Musculoskeletal - FROM of joints, no bony point tenderness, no joint tenderness, deformity or swelling Extremities - peripheral pulses normal, no pedal edema, no clubbing or cyanosis Skin - normal coloration and turgor, no rashes- superficial abrasion overlying right knee and right palm of hand  ED Course  Procedures (including critical care time) Labs Review Labs  Reviewed - No data to display  Imaging Review Ct Head Wo Contrast  04/14/2014   CLINICAL DATA:  Status post fall; hit right superior orbital rim. Patient on blood thinners. Initial encounter.  EXAM: CT HEAD WITHOUT CONTRAST  TECHNIQUE: Contiguous axial images were obtained from the base of the skull through the vertex without intravenous contrast.  COMPARISON:  CT of the head performed 01/07/2005, and CTA of the head performed 04/10/2007  FINDINGS: There is no evidence of acute infarction, mass lesion, or intra- or extra-axial hemorrhage on CT.  Prominence of the ventricles and sulci reflects mild cortical volume loss. Cerebellar atrophy is noted. Mild periventricular and subcortical white matter change likely reflects small vessel ischemic microangiopathy.  The brainstem and fourth ventricle are within normal limits. The basal ganglia are unremarkable in appearance. The cerebral hemispheres demonstrate grossly normal gray-white differentiation. No mass effect or midline shift is seen.  There is no evidence of fracture; visualized osseous structures are unremarkable in appearance. The visualized portions of the orbits are within normal  limits. The paranasal sinuses and mastoid air cells are well-aerated. Soft tissue swelling is noted overlying the right frontal calvarium, just superior to the right orbit.  IMPRESSION: 1. No evidence of traumatic intracranial injury or fracture. 2. Mild cortical volume loss and scattered small vessel ischemic microangiopathy. 3. Soft tissue swelling overlying the right frontal calvarium, just superior to the right orbit.   Electronically Signed   By: Roanna Raider M.D.   On: 04/14/2014 22:22     EKG Interpretation None      MDM   Final diagnoses:  Fall, initial encounter  Traumatic hematoma of forehead, initial encounter  Minor head injury, initial encounter  Abrasion of hand, right, initial encounter  Abrasion of knee, right, initial encounter    Pt presenting after mechanical fall.  Pt is on eliquis for afib, has hematoma on forehead.  For this reason, head CT obtained.  This was reassuring.  Superficial abrasion of hand and knee- no bony point tenderness or deformity- pt declines taking anything for pain.  Discharged with strict return precautions.  Pt agreeable with plan.    Ethelda Chick, MD 04/15/14 [phone]

## 2014-04-15 ENCOUNTER — Encounter: Payer: Self-pay | Admitting: Internal Medicine

## 2014-04-15 ENCOUNTER — Ambulatory Visit (INDEPENDENT_AMBULATORY_CARE_PROVIDER_SITE_OTHER): Payer: Medicare Other | Admitting: Internal Medicine

## 2014-04-15 VITALS — BP 110/68 | HR 72 | Temp 98.3°F | Wt 178.8 lb

## 2014-04-15 DIAGNOSIS — N183 Chronic kidney disease, stage 3 unspecified: Secondary | ICD-10-CM

## 2014-04-15 DIAGNOSIS — Z7901 Long term (current) use of anticoagulants: Secondary | ICD-10-CM

## 2014-04-15 DIAGNOSIS — S0093XD Contusion of unspecified part of head, subsequent encounter: Secondary | ICD-10-CM

## 2014-04-15 DIAGNOSIS — I480 Paroxysmal atrial fibrillation: Secondary | ICD-10-CM

## 2014-04-15 DIAGNOSIS — I5043 Acute on chronic combined systolic (congestive) and diastolic (congestive) heart failure: Secondary | ICD-10-CM

## 2014-04-15 DIAGNOSIS — I255 Ischemic cardiomyopathy: Secondary | ICD-10-CM

## 2014-04-15 DIAGNOSIS — S0093XA Contusion of unspecified part of head, initial encounter: Secondary | ICD-10-CM | POA: Insufficient documentation

## 2014-04-15 MED ORDER — TORSEMIDE 100 MG PO TABS: 100.0000 mg | ORAL_TABLET | Freq: Every day | ORAL | Status: DC

## 2014-04-15 NOTE — Assessment & Plan Note (Signed)
Doing well on Eliquis. 

## 2014-04-15 NOTE — Assessment & Plan Note (Signed)
Monitoring labs 

## 2014-04-15 NOTE — Assessment & Plan Note (Signed)
04/14/14 R forehead S/p fall Head CT was ok

## 2014-04-15 NOTE — Progress Notes (Signed)
Subjective:   HPI  F/u leg swelling - better. 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  F/u unsteady gait - he tripped and fell on 2/29; had a neg head CT (ER visit).  Using O2 at night.  He stopped Digoxin due to ?upset stomach in 12/15  F/u fatigue - not better. F/u depression, insomnia, apathy. C/o dry cough since summer - worse...  Med changes reviewed.  02/10/14 - Successful cardioversion of atrial fibrillation - feeling better.Marland Kitchen.Marland Kitchen.He is seeing Dr Johney FrameAllred.  F/u vertigo - relapsing, fatigue - better  The patient presents for a follow-up of CHF, A fib, pacemaker  chronic hypertension, chronic dyslipidemia controlled with medicines  F/u  [facility] - Dr Bennie PieriniMark Farber - a complex long hx - STENT and STENT repair in 2011  Wt Readings from Last 3 Encounters:  04/15/14 178 lb 12 oz (81.08 kg)  04/09/14 182 lb 3.2 oz (82.645 kg)  03/21/14 184 lb (83.462 kg)   BP Readings from Last 3 Encounters:  04/15/14 110/68  04/09/14 110/68  03/21/14 110/70       Review of Systems  Constitutional: Negative for appetite change, fatigue and unexpected weight change.  HENT: Positive for hearing loss. Negative for congestion, nosebleeds, sneezing, sore throat and trouble swallowing.   Eyes: Negative for itching and visual disturbance.  Respiratory: Negative for cough.   Cardiovascular: Negative for chest pain, palpitations and leg swelling.  Gastrointestinal: Negative for nausea, diarrhea, blood in stool and abdominal distention.  Genitourinary: Negative for frequency and hematuria.  Musculoskeletal: Negative for back pain, joint swelling, gait problem and neck pain.  Skin: Negative for rash.  Neurological: Negative for dizziness, tremors, speech difficulty and weakness.  Psychiatric/Behavioral: Negative for suicidal ideas, sleep disturbance, dysphoric mood and agitation. The patient is not nervous/anxious.        Objective:   Physical Exam   Constitutional: He is oriented to person, place, and time. He appears well-developed. No distress.  NAD  HENT:  Mouth/Throat: Oropharynx is clear and moist.  Eyes: Conjunctivae are normal. Pupils are equal, round, and reactive to light.  Neck: Normal range of motion. No JVD present. No thyromegaly present.  Cardiovascular: Normal rate, regular rhythm, normal heart sounds and intact distal pulses.  Exam reveals no gallop and no friction rub.   No murmur heard. Pulmonary/Chest: Effort normal and breath sounds normal. No respiratory distress. He has no wheezes. He has no rales. He exhibits no tenderness.  Abdominal: Soft. Bowel sounds are normal. He exhibits no distension and no mass. There is no tenderness. There is no rebound and no guarding.  Musculoskeletal: Normal range of motion. He exhibits no edema or tenderness.  Lymphadenopathy:    He has no cervical adenopathy.  Neurological: He is alert and oriented to person, place, and time. He has normal reflexes. No cranial nerve deficit. He exhibits normal muscle tone. He displays a negative Romberg sign. Coordination and gait normal.  Skin: Skin is warm and dry. No rash noted.  Psychiatric: He has a normal mood and affect. His behavior is normal. Judgment and thought content normal.  Looks less tired L knee with no prepatellar redness 1-2+ edema B A large hematoma and abrasion over R eye Abd CT angio 01/12/09  IMPRESSION:  1. No definite endovascular leak is currently seen.  2. However, the native aneurysm sac has increased slightly in  size, now measuring 56 x 60 mm compared to 55 x 55 mm previously.  3. Incidental gallstones.  CTA PELVIS  Findings: The iliac limbs of the AAA stent opacify with no  significant abnormality noted. Both the internal and external  iliac arteries opacify. The urinary bladder is slightly thick-  walled. The prostate is moderately prominent. Postop changes from  prior left hemicolectomy are again noted.    Review of the MIP images confirms the above findings.  IMPRESSION:  1. Stable iliac limbs of AAA stent.  2. Probable some degree of bladder outlet obstruction with  moderately enlarged prostate.      Assessment & Plan:

## 2014-04-15 NOTE — Assessment & Plan Note (Signed)
On Ticosyn

## 2014-04-15 NOTE — Progress Notes (Signed)
Pre visit review using our clinic review tool, if applicable. No additional management support is needed unless otherwise documented below in the visit note. 

## 2014-04-15 NOTE — Patient Instructions (Signed)
Take Demadex (Torsemide 60-80 or 100 mg a day) to get rid of the leg swelling

## 2014-04-15 NOTE — Assessment & Plan Note (Signed)
Take Demadex (Torsemide 60-80 or 100 mg a day) to get rid of the leg swelling Consider adding [facility]

## 2014-04-16 NOTE — Progress Notes (Signed)
Genetic AF Informed Consent  Subject Name: [name]  Subject met inclusion and exclusion criteria. The informed consent form, study requirements and expectations were reviewed with the subject and questions and concerns were addressed prior to the signing of the consent form. The subject verbalized understanding of the trail requirements. The subject agreed to participate in the Genetic AF trial and signed the informed consent. The informed consent was obtained prior to performance of any protocol-specific procedures for the subject. A copy of the signed informed consent was given to the subject and a copy was placed in the subject's medical record.   Raquel Sarna [name]  04/16/2014 @ 15:15

## 2014-04-17 ENCOUNTER — Telehealth: Payer: Self-pay | Admitting: Internal Medicine

## 2014-04-17 NOTE — Telephone Encounter (Signed)
Patient has been doubling lasix and has reached a weight of 169.4. He was told to get to 170. However, his feet are still tight. Please call patient to advise if he should continue with double dose of lasix  He also had another fall this morning around 2AM in the bathroom. This makes the second fall this week.

## 2014-04-18 NOTE — Telephone Encounter (Signed)
Notified pt with md response. Pt states he hasn't been taking lasix he ais taking Torsemide 20 mg 2 pills twice a day. He states he can go up to 100 mg but he is concern about getting dehydrated. He stated this morning when he woke up he was dry. Want to know what can he do from getting dehydrated...Raechel Chute/lmb

## 2014-04-18 NOTE — Telephone Encounter (Signed)
He is supposed to take Torsemide (Demadex), not Lasix. He can go up to 100 mg Demadex a day. Keep legs elevated. Thx

## 2014-04-18 NOTE — Telephone Encounter (Signed)
Notified pt with md response.../lmb 

## 2014-04-18 NOTE — Telephone Encounter (Signed)
No need to take bid: try Torsemide 60 or 80 mg once a day in am Thx

## 2014-04-21 ENCOUNTER — Other Ambulatory Visit: Payer: Self-pay | Admitting: *Deleted

## 2014-04-21 ENCOUNTER — Telehealth: Payer: Self-pay | Admitting: *Deleted

## 2014-04-21 ENCOUNTER — Other Ambulatory Visit (INDEPENDENT_AMBULATORY_CARE_PROVIDER_SITE_OTHER): Payer: Medicare Other | Admitting: *Deleted

## 2014-04-21 DIAGNOSIS — E875 Hyperkalemia: Secondary | ICD-10-CM

## 2014-04-21 DIAGNOSIS — I481 Persistent atrial fibrillation: Secondary | ICD-10-CM

## 2014-04-21 DIAGNOSIS — E785 Hyperlipidemia, unspecified: Secondary | ICD-10-CM | POA: Diagnosis not present

## 2014-04-21 DIAGNOSIS — I4819 Other persistent atrial fibrillation: Secondary | ICD-10-CM

## 2014-04-21 LAB — HEPATIC FUNCTION PANEL
ALBUMIN: 3.4 g/dL — AB (ref 3.5–5.2)
ALT: 17 U/L (ref 0–53)
AST: 21 U/L (ref 0–37)
Alkaline Phosphatase: 111 U/L (ref 39–117)
BILIRUBIN DIRECT: 0.8 mg/dL — AB (ref 0.0–0.3)
TOTAL PROTEIN: 7.3 g/dL (ref 6.0–8.3)
Total Bilirubin: 1.8 mg/dL — ABNORMAL HIGH (ref 0.2–1.2)

## 2014-04-21 LAB — BASIC METABOLIC PANEL
BUN: 40 mg/dL — AB (ref 6–23)
CO2: 35 mEq/L — ABNORMAL HIGH (ref 19–32)
Calcium: 9.4 mg/dL (ref 8.4–10.5)
Chloride: 96 mEq/L (ref 96–112)
Creatinine, Ser: 1.76 mg/dL — ABNORMAL HIGH (ref 0.40–1.50)
GFR: 39.22 mL/min — ABNORMAL LOW (ref >60.00)
GLUCOSE: 164 mg/dL — AB (ref 70–99)
Potassium: 4.3 mEq/L (ref 3.5–5.1)
Sodium: 134 mEq/L — ABNORMAL LOW (ref 135–145)

## 2014-04-21 LAB — PHOSPHORUS: Phosphorus: 3.4 mg/dL (ref 2.3–4.6)

## 2014-04-21 LAB — CALCIUM: CALCIUM: 9.4 mg/dL (ref 8.4–10.5)

## 2014-04-21 NOTE — Telephone Encounter (Signed)
Abnormal Genetic AF lab results received-Potassium 7.2, Calcium 10.7, Magnesium 2.6, Creatinine 1.82. Dr. Johney FrameAllred reviewed results-[facility]. Pt verbalizes understanding and states he will arrive between 1-2pm for repeat labs.

## 2014-04-22 ENCOUNTER — Telehealth: Payer: Self-pay | Admitting: Internal Medicine

## 2014-04-22 NOTE — Telephone Encounter (Signed)
Caitlin w/[facility] would like to increase the home visits to once a week for a couple of weeks. She would also like to know what to do with the bruise over his right eye. You can leave a verbal order on her confidential voicemail at [phone].

## 2014-04-23 ENCOUNTER — Telehealth: Payer: Self-pay | Admitting: Internal Medicine

## 2014-04-23 ENCOUNTER — Other Ambulatory Visit: Payer: Self-pay | Admitting: Internal Medicine

## 2014-04-23 NOTE — Progress Notes (Signed)
Subject screened and consented to Genetic AF 04/16/2014. Genetic AF lab work received from sponsor and pt does not possess genetic marker to continue in study. Pt advised and verbalized understanding. Pt will f/u with Dr. Johney FrameAllred as scheduled.

## 2014-04-23 NOTE — Telephone Encounter (Signed)
New problem   Pt wants a sooner appt because he want to start the Amiodarone trial. Please advise pt.

## 2014-04-23 NOTE — Telephone Encounter (Signed)
OK visits Gentle heat to bruise qid Thx

## 2014-04-23 NOTE — Telephone Encounter (Signed)
Tried to call patient.  Line busy

## 2014-04-24 NOTE — Telephone Encounter (Signed)
Spoke with patient and let him know that we do not have another appointment before his on 05/05/14.  But if we have someone cancel could let him know

## 2014-04-25 ENCOUNTER — Other Ambulatory Visit: Payer: Self-pay | Admitting: Internal Medicine

## 2014-04-28 ENCOUNTER — Ambulatory Visit: Payer: Self-pay | Admitting: Internal Medicine

## 2014-04-28 DIAGNOSIS — I4819 Other persistent atrial fibrillation: Secondary | ICD-10-CM

## 2014-04-29 ENCOUNTER — Encounter: Payer: Self-pay | Admitting: Internal Medicine

## 2014-04-29 ENCOUNTER — Other Ambulatory Visit (INDEPENDENT_AMBULATORY_CARE_PROVIDER_SITE_OTHER): Payer: Medicare Other

## 2014-04-29 ENCOUNTER — Telehealth: Payer: Self-pay | Admitting: *Deleted

## 2014-04-29 ENCOUNTER — Ambulatory Visit (INDEPENDENT_AMBULATORY_CARE_PROVIDER_SITE_OTHER): Payer: Medicare Other | Admitting: Internal Medicine

## 2014-04-29 VITALS — BP 100/54 | HR 79 | Temp 97.4°F | Wt 174.0 lb

## 2014-04-29 DIAGNOSIS — I5023 Acute on chronic systolic (congestive) heart failure: Secondary | ICD-10-CM | POA: Diagnosis not present

## 2014-04-29 DIAGNOSIS — I481 Persistent atrial fibrillation: Secondary | ICD-10-CM | POA: Diagnosis not present

## 2014-04-29 DIAGNOSIS — I255 Ischemic cardiomyopathy: Secondary | ICD-10-CM

## 2014-04-29 DIAGNOSIS — I5022 Chronic systolic (congestive) heart failure: Secondary | ICD-10-CM

## 2014-04-29 DIAGNOSIS — I4819 Other persistent atrial fibrillation: Secondary | ICD-10-CM

## 2014-04-29 LAB — BASIC METABOLIC PANEL
BUN: 44 mg/dL — ABNORMAL HIGH (ref 6–23)
CO2: 35 mEq/L — ABNORMAL HIGH (ref 19–32)
Calcium: 9.9 mg/dL (ref 8.4–10.5)
Chloride: 96 mEq/L (ref 96–112)
Creatinine, Ser: 1.82 mg/dL — ABNORMAL HIGH (ref 0.40–1.50)
GFR: 37.73 mL/min — ABNORMAL LOW (ref >60.00)
GLUCOSE: 110 mg/dL — AB (ref 70–99)
Potassium: 4.4 mEq/L (ref 3.5–5.1)
SODIUM: 135 meq/L (ref 135–145)

## 2014-04-29 MED ORDER — TORSEMIDE 20 MG PO TABS: 20.0000 mg | ORAL_TABLET | Freq: Every day | ORAL | Status: AC

## 2014-04-29 MED ORDER — LOSARTAN POTASSIUM 50 MG PO TABS: 50.0000 mg | ORAL_TABLET | Freq: Every day | ORAL | Status: AC

## 2014-04-29 NOTE — Telephone Encounter (Signed)
Left detailed mess informing Caitlyn of below.  

## 2014-04-29 NOTE — Assessment & Plan Note (Signed)
On Demadex 60 mg qd Labs Re-start Losartan if tolerated 25 mg

## 2014-04-29 NOTE — Assessment & Plan Note (Signed)
Rate controlled Check Digoxin level

## 2014-04-29 NOTE — Telephone Encounter (Signed)
rec fax stating per pt- you told him to take Torsemide 20 mg at least tid.   Last Rx sent was for 1 qd. Please advise.

## 2014-04-29 NOTE — Patient Instructions (Addendum)
Re-start Losartan at 25 mg a day. Hold Losartan if systolic BP<100

## 2014-04-29 NOTE — Assessment & Plan Note (Signed)
On Demadex 60 mg qd Labs 

## 2014-04-29 NOTE — Assessment & Plan Note (Signed)
On Demadex 60 mg qd Labs

## 2014-04-29 NOTE — Progress Notes (Signed)
Pre visit review using our clinic review tool, if applicable. No additional management support is needed unless otherwise documented below in the visit note. 

## 2014-04-29 NOTE — Progress Notes (Signed)
Subjective:   HPI  C/o a stomach virus last week - n/v  Not using Losartan for ?reason  F/u leg swelling - a little better. 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  F/u unsteady gait - he tripped and fell on 2/29; had a neg head CT (ER visit).  Using O2 at night.  He stopped Digoxin due to ?upset stomach in 12/15  F/u fatigue - not better. F/u depression, insomnia, apathy. C/o dry cough since summer - worse...  Med changes reviewed.  02/10/14 - Successful cardioversion of atrial fibrillation - feeling better.Marland Kitchen.Marland Kitchen.He is seeing Dr Johney FrameAllred.  F/u vertigo - relapsing, fatigue - better  The patient presents for a follow-up of CHF, A fib, pacemaker  chronic hypertension, chronic dyslipidemia controlled with medicines  F/u  [facility] - Dr Bennie PieriniMark Farber - a complex long hx - STENT and STENT repair in 2011  Wt Readings from Last 3 Encounters:  04/29/14 174 lb (78.926 kg)  04/15/14 178 lb 12 oz (81.08 kg)  04/09/14 182 lb 3.2 oz (82.645 kg)   BP Readings from Last 3 Encounters:  04/29/14 100/54  04/15/14 110/68  04/09/14 110/68       Review of Systems  Constitutional: Negative for appetite change, fatigue and unexpected weight change.  HENT: Positive for hearing loss. Negative for congestion, nosebleeds, sneezing, sore throat and trouble swallowing.   Eyes: Negative for itching and visual disturbance.  Respiratory: Negative for cough.   Cardiovascular: Negative for chest pain, palpitations and leg swelling.  Gastrointestinal: Negative for nausea, diarrhea, blood in stool and abdominal distention.  Genitourinary: Negative for frequency and hematuria.  Musculoskeletal: Negative for back pain, joint swelling, gait problem and neck pain.  Skin: Negative for rash.  Neurological: Negative for dizziness, tremors, speech difficulty and weakness.  Psychiatric/Behavioral: Negative for suicidal ideas, sleep disturbance, dysphoric mood and  agitation. The patient is not nervous/anxious.        Objective:   Physical Exam  Constitutional: He is oriented to person, place, and time. He appears well-developed. No distress.  NAD  HENT:  Mouth/Throat: Oropharynx is clear and moist.  Eyes: Conjunctivae are normal. Pupils are equal, round, and reactive to light.  Neck: Normal range of motion. No JVD present. No thyromegaly present.  Cardiovascular: Normal rate, regular rhythm, normal heart sounds and intact distal pulses.  Exam reveals no gallop and no friction rub.   No murmur heard. Pulmonary/Chest: Effort normal and breath sounds normal. No respiratory distress. He has no wheezes. He has no rales. He exhibits no tenderness.  Abdominal: Soft. Bowel sounds are normal. He exhibits no distension and no mass. There is no tenderness. There is no rebound and no guarding.  Musculoskeletal: Normal range of motion. He exhibits no edema or tenderness.  Lymphadenopathy:    He has no cervical adenopathy.  Neurological: He is alert and oriented to person, place, and time. He has normal reflexes. No cranial nerve deficit. He exhibits normal muscle tone. He displays a negative Romberg sign. Coordination and gait normal.  Skin: Skin is warm and dry. No rash noted.  Psychiatric: He has a normal mood and affect. His behavior is normal. Judgment and thought content normal.  Looks less tired L knee with no prepatellar redness 1-2+ edema B A large hematoma and abrasion over R eye Abd CT angio 01/12/09  IMPRESSION:  1. No definite endovascular leak is currently seen.  2. However, the native aneurysm sac has increased slightly  in  size, now measuring 56 x 60 mm compared to 55 x 55 mm previously.  3. Incidental gallstones.  CTA PELVIS  Findings: The iliac limbs of the AAA stent opacify with no  significant abnormality noted. Both the internal and external  iliac arteries opacify. The urinary bladder is slightly thick-  walled. The prostate is  moderately prominent. Postop changes from  prior left hemicolectomy are again noted.  Review of the MIP images confirms the above findings.  IMPRESSION:  1. Stable iliac limbs of AAA stent.  2. Probable some degree of bladder outlet obstruction with  moderately enlarged prostate.      Assessment & Plan:

## 2014-04-29 NOTE — Telephone Encounter (Signed)
See note

## 2014-04-30 LAB — DIGOXIN LEVEL: Digoxin Level: 1.3 ng/mL (ref 0.8–2.0)

## 2014-05-01 ENCOUNTER — Telehealth: Payer: Self-pay | Admitting: Internal Medicine

## 2014-05-01 ENCOUNTER — Emergency Department (HOSPITAL_COMMUNITY): Payer: Medicare Other

## 2014-05-01 ENCOUNTER — Inpatient Hospital Stay (HOSPITAL_COMMUNITY): Payer: Medicare Other

## 2014-05-01 ENCOUNTER — Encounter (HOSPITAL_COMMUNITY): Payer: Self-pay | Admitting: Emergency Medicine

## 2014-05-01 ENCOUNTER — Inpatient Hospital Stay (HOSPITAL_COMMUNITY)
Admission: EM | Admit: 2014-05-01 | Discharge: 2014-05-16 | DRG: 291 | Disposition: E | Payer: Medicare Other | Attending: Cardiology | Admitting: Cardiology

## 2014-05-01 DIAGNOSIS — D6959 Other secondary thrombocytopenia: Secondary | ICD-10-CM | POA: Diagnosis present

## 2014-05-01 DIAGNOSIS — I350 Nonrheumatic aortic (valve) stenosis: Secondary | ICD-10-CM

## 2014-05-01 DIAGNOSIS — I509 Heart failure, unspecified: Secondary | ICD-10-CM | POA: Diagnosis not present

## 2014-05-01 DIAGNOSIS — J690 Pneumonitis due to inhalation of food and vomit: Secondary | ICD-10-CM | POA: Diagnosis present

## 2014-05-01 DIAGNOSIS — R531 Weakness: Secondary | ICD-10-CM | POA: Diagnosis present

## 2014-05-01 DIAGNOSIS — K567 Ileus, unspecified: Secondary | ICD-10-CM | POA: Diagnosis not present

## 2014-05-01 DIAGNOSIS — Z8673 Personal history of transient ischemic attack (TIA), and cerebral infarction without residual deficits: Secondary | ICD-10-CM | POA: Diagnosis not present

## 2014-05-01 DIAGNOSIS — Z955 Presence of coronary angioplasty implant and graft: Secondary | ICD-10-CM | POA: Diagnosis not present

## 2014-05-01 DIAGNOSIS — E873 Alkalosis: Secondary | ICD-10-CM | POA: Diagnosis not present

## 2014-05-01 DIAGNOSIS — Z9181 History of falling: Secondary | ICD-10-CM

## 2014-05-01 DIAGNOSIS — I472 Ventricular tachycardia: Secondary | ICD-10-CM | POA: Diagnosis not present

## 2014-05-01 DIAGNOSIS — Z4502 Encounter for adjustment and management of automatic implantable cardiac defibrillator: Secondary | ICD-10-CM

## 2014-05-01 DIAGNOSIS — R112 Nausea with vomiting, unspecified: Secondary | ICD-10-CM | POA: Diagnosis present

## 2014-05-01 DIAGNOSIS — R319 Hematuria, unspecified: Secondary | ICD-10-CM | POA: Diagnosis not present

## 2014-05-01 DIAGNOSIS — J9690 Respiratory failure, unspecified, unspecified whether with hypoxia or hypercapnia: Secondary | ICD-10-CM

## 2014-05-01 DIAGNOSIS — I481 Persistent atrial fibrillation: Secondary | ICD-10-CM | POA: Diagnosis present

## 2014-05-01 DIAGNOSIS — Z87891 Personal history of nicotine dependence: Secondary | ICD-10-CM | POA: Diagnosis not present

## 2014-05-01 DIAGNOSIS — N289 Disorder of kidney and ureter, unspecified: Secondary | ICD-10-CM

## 2014-05-01 DIAGNOSIS — I248 Other forms of acute ischemic heart disease: Secondary | ICD-10-CM | POA: Diagnosis present

## 2014-05-01 DIAGNOSIS — N183 Chronic kidney disease, stage 3 unspecified: Secondary | ICD-10-CM | POA: Clinically undetermined

## 2014-05-01 DIAGNOSIS — J811 Chronic pulmonary edema: Secondary | ICD-10-CM | POA: Diagnosis not present

## 2014-05-01 DIAGNOSIS — I5023 Acute on chronic systolic (congestive) heart failure: Secondary | ICD-10-CM | POA: Diagnosis present

## 2014-05-01 DIAGNOSIS — I4819 Other persistent atrial fibrillation: Secondary | ICD-10-CM

## 2014-05-01 DIAGNOSIS — E43 Unspecified severe protein-calorie malnutrition: Secondary | ICD-10-CM | POA: Diagnosis present

## 2014-05-01 DIAGNOSIS — R5381 Other malaise: Secondary | ICD-10-CM | POA: Diagnosis present

## 2014-05-01 DIAGNOSIS — Z66 Do not resuscitate: Secondary | ICD-10-CM | POA: Diagnosis not present

## 2014-05-01 DIAGNOSIS — I255 Ischemic cardiomyopathy: Secondary | ICD-10-CM | POA: Diagnosis present

## 2014-05-01 DIAGNOSIS — I130 Hypertensive heart and chronic kidney disease with heart failure and stage 1 through stage 4 chronic kidney disease, or unspecified chronic kidney disease: Principal | ICD-10-CM | POA: Diagnosis present

## 2014-05-01 DIAGNOSIS — R570 Cardiogenic shock: Secondary | ICD-10-CM | POA: Diagnosis not present

## 2014-05-01 DIAGNOSIS — J9601 Acute respiratory failure with hypoxia: Secondary | ICD-10-CM | POA: Diagnosis not present

## 2014-05-01 DIAGNOSIS — I2510 Atherosclerotic heart disease of native coronary artery without angina pectoris: Secondary | ICD-10-CM | POA: Diagnosis present

## 2014-05-01 DIAGNOSIS — R739 Hyperglycemia, unspecified: Secondary | ICD-10-CM | POA: Diagnosis present

## 2014-05-01 DIAGNOSIS — Z9229 Personal history of other drug therapy: Secondary | ICD-10-CM

## 2014-05-01 DIAGNOSIS — N189 Chronic kidney disease, unspecified: Secondary | ICD-10-CM

## 2014-05-01 DIAGNOSIS — Z7901 Long term (current) use of anticoagulants: Secondary | ICD-10-CM | POA: Diagnosis not present

## 2014-05-01 DIAGNOSIS — D696 Thrombocytopenia, unspecified: Secondary | ICD-10-CM | POA: Diagnosis present

## 2014-05-01 DIAGNOSIS — Z951 Presence of aortocoronary bypass graft: Secondary | ICD-10-CM | POA: Diagnosis not present

## 2014-05-01 DIAGNOSIS — Z79899 Other long term (current) drug therapy: Secondary | ICD-10-CM | POA: Diagnosis not present

## 2014-05-01 DIAGNOSIS — J810 Acute pulmonary edema: Secondary | ICD-10-CM | POA: Insufficient documentation

## 2014-05-01 DIAGNOSIS — E876 Hypokalemia: Secondary | ICD-10-CM | POA: Diagnosis not present

## 2014-05-01 DIAGNOSIS — Z515 Encounter for palliative care: Secondary | ICD-10-CM

## 2014-05-01 DIAGNOSIS — N179 Acute kidney failure, unspecified: Secondary | ICD-10-CM | POA: Clinically undetermined

## 2014-05-01 DIAGNOSIS — R1110 Vomiting, unspecified: Secondary | ICD-10-CM

## 2014-05-01 DIAGNOSIS — Z95810 Presence of automatic (implantable) cardiac defibrillator: Secondary | ICD-10-CM | POA: Diagnosis not present

## 2014-05-01 DIAGNOSIS — K761 Chronic passive congestion of liver: Secondary | ICD-10-CM | POA: Diagnosis present

## 2014-05-01 DIAGNOSIS — H9190 Unspecified hearing loss, unspecified ear: Secondary | ICD-10-CM | POA: Diagnosis present

## 2014-05-01 LAB — CBC WITH DIFFERENTIAL/PLATELET
BASOS ABS: 0 10*3/uL (ref 0.0–0.1)
Basophils Relative: 0 % (ref 0–1)
EOS ABS: 0 10*3/uL (ref 0.0–0.7)
Eosinophils Relative: 0 % (ref 0–5)
HCT: 36.5 % — ABNORMAL LOW (ref 39.0–52.0)
HEMOGLOBIN: 11.9 g/dL — AB (ref 13.0–17.0)
Lymphocytes Relative: 9 % — ABNORMAL LOW (ref 12–46)
Lymphs Abs: 0.8 10*3/uL (ref 0.7–4.0)
MCH: 34.5 pg — AB (ref 26.0–34.0)
MCHC: 32.6 g/dL (ref 30.0–36.0)
MCV: 105.8 fL — AB (ref 78.0–100.0)
MONO ABS: 0.8 10*3/uL (ref 0.1–1.0)
Monocytes Relative: 9 % (ref 3–12)
NEUTROS ABS: 7 10*3/uL (ref 1.7–7.7)
NEUTROS PCT: 81 % — AB (ref 43–77)
PLATELETS: 113 10*3/uL — AB (ref 150–400)
RBC: 3.45 MIL/uL — ABNORMAL LOW (ref 4.22–5.81)
RDW: 17.6 % — AB (ref 11.5–15.5)
WBC: 8.6 10*3/uL (ref 4.0–10.5)

## 2014-05-01 LAB — URINALYSIS, ROUTINE W REFLEX MICROSCOPIC
Bilirubin Urine: NEGATIVE
Glucose, UA: NEGATIVE mg/dL
Ketones, ur: NEGATIVE mg/dL
Nitrite: NEGATIVE
PH: 5.5 (ref 5.0–8.0)
PROTEIN: 30 mg/dL — AB
SPECIFIC GRAVITY, URINE: 1.016 (ref 1.005–1.030)
Urobilinogen, UA: 1 mg/dL (ref 0.0–1.0)

## 2014-05-01 LAB — COMPREHENSIVE METABOLIC PANEL
ALT: 57 U/L — AB (ref 0–53)
ANION GAP: 16 — AB (ref 5–15)
AST: 84 U/L — ABNORMAL HIGH (ref 0–37)
Albumin: 3.7 g/dL (ref 3.5–5.2)
Alkaline Phosphatase: 124 U/L — ABNORMAL HIGH (ref 39–117)
BUN: 53 mg/dL — AB (ref 6–23)
CO2: 24 mmol/L (ref 19–32)
CREATININE: 1.89 mg/dL — AB (ref 0.50–1.35)
Calcium: 9.6 mg/dL (ref 8.4–10.5)
Chloride: 94 mmol/L — ABNORMAL LOW (ref 96–112)
GFR calc non Af Amer: 31 mL/min — ABNORMAL LOW (ref >90)
GFR, EST AFRICAN AMERICAN: 36 mL/min — AB (ref >90)
Glucose, Bld: 158 mg/dL — ABNORMAL HIGH (ref 70–99)
Potassium: 4.6 mmol/L (ref 3.5–5.1)
Sodium: 134 mmol/L — ABNORMAL LOW (ref 135–145)
TOTAL PROTEIN: 8 g/dL (ref 6.0–8.3)
Total Bilirubin: 3.7 mg/dL — ABNORMAL HIGH (ref 0.3–1.2)

## 2014-05-01 LAB — MAGNESIUM: MAGNESIUM: 2.6 mg/dL — AB (ref 1.5–2.5)

## 2014-05-01 LAB — I-STAT TROPONIN, ED: TROPONIN I, POC: 0.11 ng/mL — AB (ref 0.00–0.08)

## 2014-05-01 LAB — VITAMIN B12: Vitamin B-12: 858 pg/mL (ref 211–911)

## 2014-05-01 LAB — URINE MICROSCOPIC-ADD ON

## 2014-05-01 LAB — TROPONIN I
Troponin I: 0.12 ng/mL — ABNORMAL HIGH (ref <0.031)
Troponin I: 0.12 ng/mL — ABNORMAL HIGH (ref <0.031)

## 2014-05-01 LAB — DIGOXIN LEVEL: Digoxin Level: 1 ng/mL (ref 0.8–2.0)

## 2014-05-01 LAB — BRAIN NATRIURETIC PEPTIDE: B Natriuretic Peptide: 3623.9 pg/mL — ABNORMAL HIGH (ref 0.0–100.0)

## 2014-05-01 LAB — TSH: TSH: 0.711 u[IU]/mL (ref 0.350–4.500)

## 2014-05-01 MED ORDER — SODIUM CHLORIDE 0.9 % IJ SOLN
3.0000 mL | Freq: Two times a day (BID) | INTRAMUSCULAR | Status: DC
  Administered 2014-05-01 – 2014-05-04 (×6): 3 mL via INTRAVENOUS

## 2014-05-01 MED ORDER — ADULT MULTIVITAMIN W/MINERALS CH
1.0000 | ORAL_TABLET | Freq: Every day | ORAL | Status: DC
  Administered 2014-05-02 – 2014-05-06 (×5): 1 via ORAL
  Filled 2014-05-01 (×6): qty 1

## 2014-05-01 MED ORDER — SALINE SPRAY 0.65 % NA SOLN
1.0000 | Freq: Every day | NASAL | Status: DC
  Administered 2014-05-02 – 2014-05-05 (×3): 1 via NASAL
  Filled 2014-05-01 (×2): qty 44

## 2014-05-01 MED ORDER — ONDANSETRON HCL 4 MG/2ML IJ SOLN
4.0000 mg | Freq: Four times a day (QID) | INTRAMUSCULAR | Status: DC | PRN
  Administered 2014-05-06: 4 mg via INTRAVENOUS
  Filled 2014-05-01 (×2): qty 2

## 2014-05-01 MED ORDER — SODIUM CHLORIDE 0.9 % IJ SOLN: 3.0000 mL | INTRAMUSCULAR | Status: DC | PRN

## 2014-05-01 MED ORDER — ACETAMINOPHEN 500 MG PO TABS
1000.0000 mg | ORAL_TABLET | Freq: Every morning | ORAL | Status: DC
  Administered 2014-05-02 – 2014-05-04 (×3): 1000 mg via ORAL
  Filled 2014-05-01 (×3): qty 2

## 2014-05-01 MED ORDER — SODIUM CHLORIDE 0.9 % IV SOLN: 250.0000 mL | INTRAVENOUS | Status: DC | PRN

## 2014-05-01 MED ORDER — CARVEDILOL 6.25 MG PO TABS
6.2500 mg | ORAL_TABLET | Freq: Two times a day (BID) | ORAL | Status: DC
  Administered 2014-05-01 – 2014-05-05 (×8): 6.25 mg via ORAL
  Filled 2014-05-01 (×10): qty 1

## 2014-05-01 MED ORDER — FUROSEMIDE 10 MG/ML IJ SOLN
80.0000 mg | Freq: Every day | INTRAMUSCULAR | Status: DC
  Administered 2014-05-01 – 2014-05-02 (×2): 80 mg via INTRAVENOUS
  Filled 2014-05-01 (×2): qty 8

## 2014-05-01 MED ORDER — DIGOXIN 0.0625 MG HALF TABLET
62.5000 ug | ORAL_TABLET | Freq: Every day | ORAL | Status: DC
Start: 2014-05-01 — End: 2014-05-05
  Administered 2014-05-01 – 2014-05-05 (×5): 62.5 ug via ORAL
  Filled 2014-05-01 (×5): qty 1

## 2014-05-01 MED ORDER — APIXABAN 2.5 MG PO TABS
2.5000 mg | ORAL_TABLET | Freq: Two times a day (BID) | ORAL | Status: DC
  Administered 2014-05-01 – 2014-05-05 (×8): 2.5 mg via ORAL
  Filled 2014-05-01 (×14): qty 1

## 2014-05-01 MED ORDER — ACETAMINOPHEN 325 MG PO TABS
650.0000 mg | ORAL_TABLET | ORAL | Status: DC | PRN
  Administered 2014-05-02: 650 mg via ORAL
  Filled 2014-05-01 (×2): qty 2

## 2014-05-01 MED ORDER — RANOLAZINE ER 500 MG PO TB12
500.0000 mg | ORAL_TABLET | Freq: Two times a day (BID) | ORAL | Status: DC
  Administered 2014-05-01 – 2014-05-02 (×3): 500 mg via ORAL
  Filled 2014-05-01 (×4): qty 1

## 2014-05-01 MED ORDER — DOFETILIDE 125 MCG PO CAPS
125.0000 ug | ORAL_CAPSULE | Freq: Two times a day (BID) | ORAL | Status: DC
  Administered 2014-05-01 – 2014-05-02 (×2): 125 ug via ORAL
  Filled 2014-05-01 (×2): qty 1

## 2014-05-01 MED ORDER — POLYVINYL ALCOHOL 1.4 % OP SOLN
2.0000 [drp] | Freq: Two times a day (BID) | OPHTHALMIC | Status: DC | PRN
  Filled 2014-05-01: qty 15

## 2014-05-01 NOTE — ED Notes (Signed)
Notified Dr. Madilyn Hookees about Istat.

## 2014-05-01 NOTE — ED Notes (Addendum)
Per EMS: Pt from home.  Pt c/o generalized weakness x 2 days.  Pt appears pale.  Afib on the monitor which is normal for pt at a rate of 80-110.  Has an ICD.  Lives at home alone but daughter comes to check on him.  A&O x 4.

## 2014-05-01 NOTE — Progress Notes (Signed)
Pt admitted from ER. Pt noted at admission assessment to have laceration above right eye and moderate sized hematoma. Bruising around area noted. . Large Bruise also noted to right hip area. Pt states "I fell outside and hit my and head and then a couple of days later fell in the bathroom and hit my hip." Lower Ext with 3+edema bilat and bilat lower ext very cool to touch. Pedal pulses 1+ bilat

## 2014-05-01 NOTE — Telephone Encounter (Signed)
Patient Name: [name] RuizJOHN Wirsing DOB: 10/04/1926 Initial Comment caller states he is weak all over Nurse Assessment Nurse: Elijah Birkaldwell, RN, Stark BrayLynda Date/Time (Eastern Time): April 28, 2014 8:15:32 AM Confirm and document reason for call. If symptomatic, describe symptoms. ---Caller states he is weak all over, has progressed over the last 3 days. He had swelling in his legs, has had to re-hydrate. Had a caregiver at first, then trying to care for himself has been difficult. Has been in A fib for about 6 months. He needs to have someone come see him at his house. Seen on Tuesday, his doctor felt they had achieved most of the goals. Has the patient traveled out of the country within the last 30 days? ---Not Applicable Does the patient require triage? ---Yes Related visit to physician within the last 2 weeks? ---Yes Does the PT have any chronic conditions? (i.e. diabetes, asthma, etc.) ---Yes List chronic conditions. ---A fib Guidelines Guideline Title Affirmed Question Affirmed Notes Weakness (Generalized) and Fatigue [1] SEVERE weakness (i.e., unable to walk or barely able to walk, requires support) AND [2] new onset or worsening Final Disposition User Call EMS 911 Now Benoitaldwell, RN, Lynda Comments Caller states he is on oxygen all the time & to just adjust the covers of his bed, he begins to pant.

## 2014-05-01 NOTE — Telephone Encounter (Signed)
Pt is schedule to see Dr. Johney FrameAllred 05/05/14.Marland Kitchen.Raechel Chute/lmb

## 2014-05-01 NOTE — ED Notes (Signed)
Bed: WA19 Expected date:  Expected time:  Means of arrival:  Comments: EMS 

## 2014-05-01 NOTE — ED Notes (Signed)
MD Rees at bedside. 

## 2014-05-01 NOTE — ED Provider Notes (Signed)
CSN: [id number]     Arrival date & time 03/28/2014  [id number] History   First MD Initiated Contact with Patient 002/01/2015 [phone]     Chief Complaint  Patient presents with  . Weakness     The history is provided by the patient. No language interpreter was used.   Mr. [name] presents for evaluation of weakness. He reports 2-3 days of generalized weakness. He feels very fatigued and short of breath and has difficulty with minimal activity in his home. He had a fall at home two weeks ago and was evaluated in emergency department at that time with negative head imaging. He is on oxygen at home continuously 2 L. He has been treated for congestive heart failure and has an improvement in his lower extremity edema from his baseline. He states that he feels as if he is dehydrated from all the fluid pills. Symptoms are moderate, constant, worsening. Takes Eliquis for atrial fibrillation.  Past Medical History  Diagnosis Date  . Atrial fibrillation     AFIB  . Ischemic cardiomyopathy     CABG in 1991; last catheter 2005 with patent vein graft to PD, diagonal, marginal/echo 2010 EF 25%.  . 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 Abdominal aneurysm without mention of rupture     s/p stent graft with endoleak requiring restenting-[facility]  . Chronic systolic heart failure     SYSTOLIC .Marland Kitchen.ACUTE ON CHRONIC  . Hypertension   . Peripheral vascular disease, unspecified   . Hemoptysis   . Acute prostatitis   . Labyrinthitis, unspecified   . Unspecified hearing loss   . Cervicalgia   . Stroke   . CKD (chronic kidney disease), stage III   . Chronic anticoagulation     Coumadin   Past Surgical History  Procedure Laterality Date  . Insert / replace / remove pacemaker  2006    AICD GUIDANT VITALITY..IN SITU  . Colectomy  07/15/96  . Colostomy  07/15/96    COLOSTOMY TAKEN DOWN 01/14/97  . Coronary artery bypass graft  03/22/89  . Cardiac catheterization  2001    ANGIOPLASTY  W/STENT RCA  . Abdominal aortic aneurysm repair  05/1999    STENT, ENDOVASCULAR RELINING OF GRAFT 3/11  . Cardioversion    . Tee without cardioversion N/A 12/12/2013    Procedure: TRANSESOPHAGEAL ECHOCARDIOGRAM (TEE);  Surgeon: [name], MD;  [facility];  Service: Cardiovascular;  Laterality: N/A;  . Cardioversion N/A 01/06/2014    Procedure: CARDIOVERSION;  Surgeon: Laurey Moralealton S McLean, MD;  [facility];  Service: Cardiovascular;  Laterality: N/A;  . Implantable cardioverter defibrillator (icd) generator change N/A 01/18/2011    Procedure: ICD GENERATOR CHANGE;  Surgeon: Duke SalviaSteven C Klein, MD;  [facility];  Service: Cardiovascular;  Laterality: N/A;  . Cardioversion N/A 02/10/2014    Procedure: CARDIOVERSION;  Surgeon: Quintella Reichertraci R Turner, MD;  Location: [facility] ENDOSCOPY;  Service: Cardiovascular;  Laterality: N/A;   Family History  Problem Relation Age of Onset  . Stroke Mother   . Heart attack Father     X 6  . Heart disease Father   . Rheum arthritis Sister   . Heart disease Brother    History  Substance Use Topics  . Smoking status: Former Smoker -- 22 years    Quit date: 02/15/1971  . Smokeless tobacco: Not on file  . Alcohol Use: No    Review of Systems  All other systems reviewed and are negative.  Allergies  Benazepril and Promethazine hcl  Home Medications   Prior to Admission medications   Medication Sig Start Date End Date Taking? Authorizing Provider  acetaminophen (TYLENOL) 500 MG tablet Take 1,000 mg by mouth every morning.     Historical Provider, MD  apixaban (ELIQUIS) 2.5 MG TABS tablet Take 1 tablet (2.5 mg total) by mouth 2 (two) times daily. 12/16/13   Duke Salvia, MD  Carboxymethylcellulose Sodium (REFRESH TEARS OP) Place 1-2 drops into both eyes daily as needed (dry eyes).    Historical Provider, MD  carvedilol (COREG) 6.25 MG tablet Take 1 tablet (6.25 mg total) by mouth 2 (two) times daily with a meal. 12/13/13    Rhonda G Barrett, PA-C  Copper Gluconate 2 MG TABS Take 2 mg by mouth daily as needed (joint pain).     Historical Provider, MD  diclofenac sodium (VOLTAREN) 1 % GEL Apply 4 g topically 2 (two) times daily as needed (pain).    Historical Provider, MD  digoxin (LANOXIN) 0.125 MG tablet TAKE 1/2 TABLET ONCE DAILY Patient taking differently: TAKE 1/2 TABLET BY MOUTH ONCE DAILY 04/01/14   Aleksei Plotnikov V, MD  dofetilide (TIKOSYN) 125 MCG capsule Take 125 mcg by mouth 2 (two) times daily.    Historical Provider, MD  ELIQUIS 2.5 MG TABS tablet TAKE 1 TABLET BY MOUTH TWICE DAILY 04/23/14   Duke Salvia, MD  losartan (COZAAR) 50 MG tablet Take 1 tablet (50 mg total) by mouth daily. 04/29/14   Aleksei Plotnikov V, MD  Magnesium 250 MG TABS Take 250 mg by mouth every evening.     Historical Provider, MD  Multiple Vitamin (MULTIVITAMIN) tablet Take 1 tablet by mouth daily.     Historical Provider, MD  nitroGLYCERIN (NITROSTAT) 0.4 MG SL tablet Place 0.4 mg under the tongue every 5 (five) minutes as needed for chest pain.     Historical Provider, MD  Polyethyl Glycol-Propyl Glycol (SYSTANE OP) Place 1-2 drops into both eyes daily as needed (dry eyes).     Historical Provider, MD  RANEXA 500 MG 12 hr tablet TAKE 1 TABLET BY MOUTH TWICE A DAY 04/28/14   Duke Salvia, MD  sodium chloride (OCEAN) 0.65 % SOLN nasal spray Place 1 spray into both nostrils daily.    Historical Provider, MD  torsemide (DEMADEX) 20 MG tablet Take 1-3 tablets (20-60 mg total) by mouth daily. 04/29/14   Aleksei Plotnikov V, MD   BP 134/73 mmHg  Pulse 86  Temp(Src) 98 F (36.7 C) (Oral)  Resp 32  SpO2 95% Physical Exam  Constitutional: He is oriented to person, place, and time. He appears well-developed and well-nourished.  HENT:  Head: Normocephalic.  Healing ecchymosis to the right periorbital region  Eyes: Pupils are equal, round, and reactive to light.  Cardiovascular:  No murmur heard. Irregular rhythm  Pulmonary/Chest:   Tachypnea with decreased air movement in the left lung fields, fine crackles in the left lung fields  Abdominal: Soft. There is no tenderness. There is no rebound and no guarding.  Musculoskeletal: He exhibits no tenderness.  2-3+ pitting edema in bilateral lower extremities  Neurological: He is alert and oriented to person, place, and time.  Skin: Skin is warm and dry.  Psychiatric: He has a normal mood and affect. His behavior is normal.  Nursing note and vitals reviewed.   ED Course  Procedures (including critical care time) Labs Review Labs Reviewed  COMPREHENSIVE METABOLIC PANEL - Abnormal; Notable for the following:  Sodium 134 (*)    Chloride 94 (*)    Glucose, Bld 158 (*)    BUN 53 (*)    Creatinine, Ser 1.89 (*)    AST 84 (*)    ALT 57 (*)    Alkaline Phosphatase 124 (*)    Total Bilirubin 3.7 (*)    GFR calc non Af Amer 31 (*)    GFR calc Af Amer 36 (*)    Anion gap 16 (*)    All other components within normal limits  CBC WITH DIFFERENTIAL/PLATELET - Abnormal; Notable for the following:    RBC 3.45 (*)    Hemoglobin 11.9 (*)    HCT 36.5 (*)    MCV 105.8 (*)    MCH 34.5 (*)    RDW 17.6 (*)    Platelets 113 (*)    Neutrophils Relative % 81 (*)    Lymphocytes Relative 9 (*)    All other components within normal limits  I-STAT TROPOININ, ED - Abnormal; Notable for the following:    Troponin i, poc 0.11 (*)    All other components within normal limits  URINALYSIS, ROUTINE W REFLEX MICROSCOPIC  BRAIN NATRIURETIC PEPTIDE  TROPONIN I    Imaging Review Dg Chest 2 View  04/30/2014   CLINICAL DATA:  Initial evaluation for weakness, shortness of breath, history of atrial fibrillation  EXAM: CHEST  2 VIEW  COMPARISON:  12/09/2013  FINDINGS: Moderate enlargement of cardiac silhouette. Moderate vascular congestion. Peribronchial wall thickening and mild interstitial prominence with mild hazy density right perihilar area. Chronic pleural parenchymal thickening left lung  base. Cardiac defibrillator again noted.  IMPRESSION: Congestive heart failure with mild interstitial pulmonary edema   Electronically Signed   By: Esperanza Heir M.D.   On: 04/20/2014 10:43     EKG Interpretation   Date/Time:  Thursday May 01 2014 09:33:07 EDT Ventricular Rate:  84 PR Interval:    QRS Duration: 150 QT Interval:  380 QTC Calculation: 449 R Axis:   -33 Text Interpretation:  Atrial fibrillation Left bundle branch block  Confirmed by Lincoln Brigham [phone]) on 05/07/2014 9:56:01 AM      MDM   Final diagnoses:  Weakness generalized  Acute on chronic congestive heart failure, unspecified congestive heart failure type  Renal insufficiency    Patient with history of congestive heart failure as well as atrial fibrillation here for progressive generalized weakness. Patient has a nonfocal neurologic exam. Troponin is elevated. Patient has no history of chest pain there are no significant changes on this EKG compared to prior. Unclear the significance of this elevated troponin it is related to heart failure versus kidney disease, cardiology consultation for recommendations for treatment of his congestive heart failure and elevated troponin. Discussed medicine regarding admission for generalized weakness and progressive congestive heart failure.    Tilden Fossa, MD 04/22/2014 1447

## 2014-05-01 NOTE — Telephone Encounter (Signed)
Please move up [name]'s cardiology appt Thx

## 2014-05-01 NOTE — ED Notes (Signed)
Cardiology PA at bedside. 

## 2014-05-01 NOTE — H&P (Signed)
History and Physical  [name] WUJ:[id number] DOB: [birth year] DOA: 04/28/2014   PCP: [name] PrimesAlex Plotnikov, MD   Chief Complaint: generalized weakness, dyspnea on exertion  HPI:  81 year old male with a history of persistent atrial fibrillation, ischemic cardiomyopathy, CKD stage III presents with 3 days history of dyspnea on exertion and worsening generalized weakness. The patient is a fair historian, but not very accurate when elucidating details. The patient has seen his primary care physician numerous times in the past 3 months. According to the medical record, the patient was taking torsemide up to 100mg  daily starting on 04/15/2014. This was decreased to 60 mg daily as noted on office visit on 04/29/2014 because the patient was feeling dehydrated and worsening renal function. In addition, losartan was restarted on his office visit 04/29/2014. When I spoke with the patient, he told me that he was taking furosemide 40 mg daily prior to being switched to torsemide 60 mg daily. He denies any chest pain, nausea, vomiting, diarrhea, abdominal pain, dysuria, hematuria. He complains of increasing weakness and having difficulty getting up out of bed. He is being having some dyspnea on exertion. At home, the patient usually wears 2 L nasal cannula at nighttime. The patient lives by himself, but has a caretaker that helps him with his medications. He has been using a walker to ambulate, but in the past 2 weeks he has had at least 2 falls. He states that he normally sleeps on 2 pillows which has not changed. He continues to complain of leg edema although this is somewhat improved since starting torsemide 60 mg daily on 04/29/2014. Apparently the patient had an adenosine stress test on 02/26/2014. Unfortunately, I'm unable to access those results In the emergency department, the patient was afebrile and hemodynamically stable. Oxygen saturation was 98% on 2 L. Serum creatinine was 1.89. AST 84, ALT 57, alkaline  phosphatase is 124, total bilirubin 3.7 point-of-care troponin was 0.11. EKG showed atrial fibrillation with LBBB Assessment/Plan: Acute on chronic systolic CHF -Patient appears clinically volume overloaded -Start furosemide 80 mg IV daily -Monitor renal function -Daily weights -Notably, patient had a weight of 182 pounds on 04/09/2014. Weight was 174 pounds on 04/29/2014 -12/12/2013 echocardiogram EF 20-25%, LA thrombus -Continue carvedilol Elevated troponin -Doubt ACS -Likely elevated in the setting of worsening renal failure and decompensated CHF -cardiology consulted and will see patient Acute on chronic renal failure (CKD stage III) -Baseline creatinine 1.5-1.8 -Difficult situation as the patient has an EF 20%--likely has cardiorenal syndrome -Monitor renal function with diuresis -Hold losartan -Abdominal ultrasound to assess for hydronephrosis Ischemic cardiomyopathy  -As discussed above  -continue Ranexa -Continue digoxin -s/p AICD Persistent atrial fibrillation  -Rate controlled  -Continue Tikosyn -Continue apixiban--difficult situation as the patient has a left atrial thrombus, recent history of falls Tranaminasemia -Likely due to chronic hepatic congestion from the patient's ischemic cardiomyopathy -Right upper quadrant ultrasound Thrombocytopenia -Suspect this is due to chronic hepatic congestion -This has been chronic -Monitor for signs of bleeding -serum B12 -Abdominal ultrasound to evaluate for cirrhosis, splenomegaly Generalized weakness -Multifactorial including decompensated CHF, deconditioning, renal failure -UA and urine culture     Past Medical History  Diagnosis Date  . Atrial fibrillation     AFIB  . Ischemic cardiomyopathy     CABG in 1991; last catheter 2005 with patent vein graft to PD, diagonal, marginal/echo 2010 EF 25%.  . 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 Abdominal aneurysm without  mention of rupture     s/p stent graft with endoleak requiring restenting-[facility]  . Chronic systolic heart failure     SYSTOLIC .Marland KitchenACUTE ON CHRONIC  . Hypertension   . Peripheral vascular disease, unspecified   . Hemoptysis   . Acute prostatitis   . Labyrinthitis, unspecified   . Unspecified hearing loss   . Cervicalgia   . Stroke   . CKD (chronic kidney disease), stage III   . Chronic anticoagulation     Coumadin   Past Surgical History  Procedure Laterality Date  . Insert / replace / remove pacemaker  2006    AICD GUIDANT VITALITY..IN SITU  . Colectomy  07/15/96  . Colostomy  07/15/96    COLOSTOMY TAKEN DOWN 01/14/97  . Coronary artery bypass graft  03/22/89  . Cardiac catheterization  2001    ANGIOPLASTY W/STENT RCA  . Abdominal aortic aneurysm repair  05/1999    STENT, ENDOVASCULAR RELINING OF GRAFT 3/11  . Cardioversion    . Tee without cardioversion N/A 12/12/2013    Procedure: TRANSESOPHAGEAL ECHOCARDIOGRAM (TEE);  Surgeon: [name], MD;  Location: [facility] ENDOSCOPY;  Service: Cardiovascular;  Laterality: N/A;  . Cardioversion N/A 01/06/2014    Procedure: CARDIOVERSION;  Surgeon: Laurey Morale, MD;  Location: [facility] ENDOSCOPY;  Service: Cardiovascular;  Laterality: N/A;  . Implantable cardioverter defibrillator (icd) generator change N/A 01/18/2011    Procedure: ICD GENERATOR CHANGE;  Surgeon: Duke Salvia, MD;  Location: [facility] CATH LAB;  Service: Cardiovascular;  Laterality: N/A;  . Cardioversion N/A 02/10/2014    Procedure: CARDIOVERSION;  Surgeon: Quintella Reichert, MD;  Location: [facility] ENDOSCOPY;  Service: Cardiovascular;  Laterality: N/A;   Social History:  reports that he quit smoking about 43 years ago. He does not have any smokeless tobacco history on file. He reports that he does not drink alcohol or use illicit drugs.   Family History  Problem Relation Age of Onset  . Stroke Mother   . Heart attack Father     X 6  . Heart disease Father   . Rheum arthritis Sister   .  Heart disease Brother      Allergies  Allergen Reactions  . Benazepril Cough  . Promethazine Hcl Other (See Comments)    REACTION: world spins..      Prior to Admission medications   Medication Sig Start Date End Date Taking? Authorizing Provider  acetaminophen (TYLENOL) 500 MG tablet Take 1,000 mg by mouth every morning.    Yes Historical Provider, MD  apixaban (ELIQUIS) 2.5 MG TABS tablet Take 1 tablet (2.5 mg total) by mouth 2 (two) times daily. 12/16/13  Yes Duke Salvia, MD  Carboxymethylcellulose Sodium (REFRESH TEARS OP) Place 1-2 drops into both eyes daily as needed (dry eyes).   Yes Historical Provider, MD  carvedilol (COREG) 6.25 MG tablet Take 1 tablet (6.25 mg total) by mouth 2 (two) times daily with a meal. 12/13/13  Yes Rhonda G Barrett, PA-C  Copper Gluconate 2 MG TABS Take 2 mg by mouth daily as needed (joint pain).    Yes Historical Provider, MD  diclofenac sodium (VOLTAREN) 1 % GEL Apply 4 g topically 2 (two) times daily as needed (pain).   Yes Historical Provider, MD  digoxin (LANOXIN) 0.125 MG tablet TAKE 1/2 TABLET ONCE DAILY 04/01/14  Yes Aleksei [name] V, MD  dofetilide (TIKOSYN) 125 MCG capsule Take 125 mcg by mouth 2 (two) times daily.   Yes Historical Provider, MD  losartan (COZAAR) 50 MG  tablet Take 1 tablet (50 mg total) by mouth daily. 04/29/14  Yes Aleksei [name] V, MD  Magnesium 250 MG TABS Take 250 mg by mouth every evening.    Yes Historical Provider, MD  Multiple Vitamin (MULTIVITAMIN) tablet Take 1 tablet by mouth daily.    Yes Historical Provider, MD  nitroGLYCERIN (NITROSTAT) 0.4 MG SL tablet Place 0.4 mg under the tongue every 5 (five) minutes as needed for chest pain.    Yes Historical Provider, MD  Polyethyl Glycol-Propyl Glycol (SYSTANE OP) Place 1-2 drops into both eyes daily as needed (dry eyes).    Yes Historical Provider, MD  RANEXA 500 MG 12 hr tablet TAKE 1 TABLET BY MOUTH TWICE A DAY 04/28/14  Yes Duke Salvia, MD  sodium chloride  (OCEAN) 0.65 % SOLN nasal spray Place 1 spray into both nostrils daily.   Yes Historical Provider, MD  torsemide (DEMADEX) 20 MG tablet Take 1-3 tablets (20-60 mg total) by mouth daily. 04/29/14  Yes Aleksei [name] V, MD  ELIQUIS 2.5 MG TABS tablet TAKE 1 TABLET BY MOUTH TWICE DAILY Patient not taking: Reported on 05/07/2014 04/23/14   Duke Salvia, MD    Review of Systems:  Constitutional:  No weight loss, night sweats, Fevers, chills Head&Eyes: No headache.  No vision loss.  No eye pain or scotoma ENT:  No Difficulty swallowing,Tooth/dental problems,Sore throat,    Cardio-vascular:  No chest pain, PND,  dizziness, palpitations  GI:  No  abdominal pain, nausea, vomiting, diarrhea, loss of appetite, hematochezia, melena, heartburn, indigestion, Resp:  No cough. No coughing up of blood .No wheezing.No chest wall deformity  Skin:  no rash or lesions.  GU:  no dysuria, change in color of urine, no urgency or frequency. No flank pain.  Musculoskeletal:  No joint pain or swelling. No decreased range of motion. No back pain.  Psych:  No change in mood or affect. No depression or anxiety. Neurologic: No headache, no dysesthesia, no focal weakness, no vision loss. No syncope  Physical Exam: Filed Vitals:   04/24/2014 0928 05/15/2014 1133  BP: 134/73 131/78  Pulse: 86 89  Temp: 98 F (36.7 C)   TempSrc: Oral   Resp: 32 24  SpO2: 95% 98%   General:  A&O x 3, NAD, nontoxic, pleasant/cooperative Head/Eye: No conjunctival hemorrhage, no icterus, /AT, No nystagmus ENT:  No icterus,  No thrush, good dentition, no pharyngeal exudate Neck:  No masses, no lymphadenpathy, no bruits CV:  IRRR, no rub, no gallop, no S3 Lung:  Bilateral crackles. No wheeze Abdomen: soft/NT, +BS, nondistended, no peritoneal signs Ext: No cyanosis, No rashes, No petechiae, No lymphangitis, 2+ edema   Labs on Admission:  Basic Metabolic Panel:  Recent Labs Lab 04/29/14 1037 05/13/2014 0959  NA 135 134*   K 4.4 4.6  CL 96 94*  CO2 35* 24  GLUCOSE 110* 158*  BUN 44* 53*  CREATININE 1.82* 1.89*  CALCIUM 9.9 9.6   Liver Function Tests:  Recent Labs Lab 04/18/2014 0959  AST 84*  ALT 57*  ALKPHOS 124*  BILITOT 3.7*  PROT 8.0  ALBUMIN 3.7   No results for input(s): LIPASE, AMYLASE in the last 168 hours. No results for input(s): AMMONIA in the last 168 hours. CBC:  Recent Labs Lab 05/12/2014 0959  WBC 8.6  NEUTROABS 7.0  HGB 11.9*  HCT 36.5*  MCV 105.8*  PLT 113*   Cardiac Enzymes: No results for input(s): CKTOTAL, CKMB, CKMBINDEX, TROPONINI in the last 168 hours. BNP:  Invalid input(s): POCBNP CBG: No results for input(s): GLUCAP in the last 168 hours.  Radiological Exams on Admission: Dg Chest 2 View  2014-05-17   CLINICAL DATA:  Initial evaluation for weakness, shortness of breath, history of atrial fibrillation  EXAM: CHEST  2 VIEW  COMPARISON:  12/09/2013  FINDINGS: Moderate enlargement of cardiac silhouette. Moderate vascular congestion. Peribronchial wall thickening and mild interstitial prominence with mild hazy density right perihilar area. Chronic pleural parenchymal thickening left lung base. Cardiac defibrillator again noted.  IMPRESSION: Congestive heart failure with mild interstitial pulmonary edema   Electronically Signed   By: Esperanza Heir M.D.   On: 05/17/2014 10:43    EKG: Independently reviewed. Afib with LBBB    Time spent:60 minutes Code Status:   FULL Family Communication:   Caretaker at bedside   [name] Rosner, DO  [facility] Pager [phone]  If 7PM-7AM, please contact night-coverage [email or web address] Password [email or web address] May 17, 2014, 1:51 PM

## 2014-05-01 NOTE — ED Notes (Signed)
pts daughter Telford NabValerie Smith, [phone] Pt daughter  Elisabeth CaraJulie Duncan  [phone]

## 2014-05-01 NOTE — Consult Note (Signed)
CARDIOLOGY CONSULT NOTE   Patient ID: [name] MRN: [id number] DOB/AGE: 1926-07-16 81 y.o.  Admit date: 2014/05/09  Primary Physician   Sonda Primes, MD Primary Cardiologist   Dr. Graciela Husbands Reason for Consultation   CHF & elevated troponin  WUX:LKGM [name] is a 81 y.o. year old male with a history of CAD & CABG, Guidant ICD, ICM w/ EF 15-20% by echo 2015, persistent afib, on Eliquis, S-CHF and HTN. He had 2 falls within the last 2 weeks, that the patient states were from not using a walker.  [name] has had problems with volume overload and weakness. Dr. Posey Rea has been following him closely, and managing his diuretic therapy. However, he had an increase in his creatinine as well as an increase in his BUN. Because of this, Dr. Posey Rea cut back on his torsemide from 100 mg daily to 60 mg daily.  [name] has orthopnea and possibly PND. He states his lower extremity edema has improved since working with Dr. Posey Rea but is still present. He has had no chest pain or palpitations. Today, his weakness was worse and he did not feel like he could get out of bed without help. He came to the emergency room and was admitted by internal medicine. [name] is extremely weak but also has significant shortness of breath with exertion and seems to be short of breath at rest although he denies this.  Cardiology was asked to see him for heart failure and because of an elevated troponin.   Past Medical History  Diagnosis Date  . Atrial fibrillation     AFIB  . Ischemic cardiomyopathy     CABG in 1991; last catheter 2005 with patent vein graft to PD, diagonal, marginal/echo 2010 EF 25%.  . implantable cardiac defibrillator -single chamber[V45.02]     Environmental manager  . Abdominal aneurysm without mention of rupture     s/p stent graft with endoleak requiring restenting-[facility]  . Chronic systolic heart failure     SYSTOLIC .Marland KitchenACUTE ON CHRONIC  . Hypertension   . Peripheral vascular  disease, unspecified   . Hemoptysis   . Acute prostatitis   . Labyrinthitis, unspecified   . Unspecified hearing loss   . Cervicalgia   . Stroke   . CKD (chronic kidney disease), stage III   . Chronic anticoagulation     Coumadin     Past Surgical History  Procedure Laterality Date  . Insert / replace / remove pacemaker  2006    AICD GUIDANT VITALITY..IN SITU  . Colectomy  07/15/96  . Colostomy  07/15/96    COLOSTOMY TAKEN DOWN 01/14/97  . Coronary artery bypass graft  03/22/89  . Cardiac catheterization  2001    ANGIOPLASTY W/STENT RCA  . Abdominal aortic aneurysm repair  05/1999    STENT, ENDOVASCULAR RELINING OF GRAFT 3/11  . Cardioversion    . Tee without cardioversion N/A 12/12/2013    Procedure: TRANSESOPHAGEAL ECHOCARDIOGRAM (TEE);  Surgeon: [name], MD;  Location: [facility] ENDOSCOPY;  Service: Cardiovascular;  Laterality: N/A;  . Cardioversion N/A 01/06/2014    Procedure: CARDIOVERSION;  Surgeon: Laurey Morale, MD;  Location: [facility] ENDOSCOPY;  Service: Cardiovascular;  Laterality: N/A;  . Implantable cardioverter defibrillator (icd) generator change N/A 01/18/2011    Procedure: ICD GENERATOR CHANGE;  Surgeon: Duke Salvia, MD;  Location: [facility] CATH LAB;  Service: Cardiovascular;  Laterality: N/A;  . Cardioversion N/A 02/10/2014    Procedure: CARDIOVERSION;  Surgeon: Gloris Manchester  Thornton Dales, MD;  Location: [facility] ENDOSCOPY;  Service: Cardiovascular;  Laterality: N/A;    Allergies  Allergen Reactions  . Benazepril Cough  . Promethazine Hcl Other (See Comments)    REACTION: world spins..    I have reviewed the patient's current medications  Prior to Admission medications   Medication Sig Start Date End Date Taking? Authorizing Provider  acetaminophen (TYLENOL) 500 MG tablet Take 1,000 mg by mouth every morning.    Yes Historical Provider, MD  apixaban (ELIQUIS) 2.5 MG TABS tablet Take 1 tablet (2.5 mg total) by mouth 2 (two) times daily. 12/16/13  Yes Duke Salvia, MD    Carboxymethylcellulose Sodium (REFRESH TEARS OP) Place 1-2 drops into both eyes daily as needed (dry eyes).   Yes Historical Provider, MD  carvedilol (COREG) 6.25 MG tablet Take 1 tablet (6.25 mg total) by mouth 2 (two) times daily with a meal. 12/13/13  Yes Rhonda G Barrett, PA-C  Copper Gluconate 2 MG TABS Take 2 mg by mouth daily as needed (joint pain).    Yes Historical Provider, MD  diclofenac sodium (VOLTAREN) 1 % GEL Apply 4 g topically 2 (two) times daily as needed (pain).   Yes Historical Provider, MD  digoxin (LANOXIN) 0.125 MG tablet TAKE 1/2 TABLET ONCE DAILY 04/01/14  Yes Aleksei Plotnikov V, MD  dofetilide (TIKOSYN) 125 MCG capsule Take 125 mcg by mouth 2 (two) times daily.   Yes Historical Provider, MD  losartan (COZAAR) 50 MG tablet Take 1 tablet (50 mg total) by mouth daily. 04/29/14  Yes Aleksei Plotnikov V, MD  Magnesium 250 MG TABS Take 250 mg by mouth every evening.    Yes Historical Provider, MD  Multiple Vitamin (MULTIVITAMIN) tablet Take 1 tablet by mouth daily.    Yes Historical Provider, MD  nitroGLYCERIN (NITROSTAT) 0.4 MG SL tablet Place 0.4 mg under the tongue every 5 (five) minutes as needed for chest pain.    Yes Historical Provider, MD  Polyethyl Glycol-Propyl Glycol (SYSTANE OP) Place 1-2 drops into both eyes daily as needed (dry eyes).    Yes Historical Provider, MD  RANEXA 500 MG 12 hr tablet TAKE 1 TABLET BY MOUTH TWICE A DAY 04/28/14  Yes Duke Salvia, MD  sodium chloride (OCEAN) 0.65 % SOLN nasal spray Place 1 spray into both nostrils daily.   Yes Historical Provider, MD  torsemide (DEMADEX) 20 MG tablet Take 1-3 tablets (20-60 mg total) by mouth daily. 04/29/14  Yes Aleksei Plotnikov V, MD  ELIQUIS 2.5 MG TABS tablet TAKE 1 TABLET BY MOUTH TWICE DAILY Patient not taking: Reported on 04/28/2014 04/23/14   Duke Salvia, MD     History   Social History  . Marital Status: Married    Spouse Name: N/A  . Number of Children: 3  . Years of Education: 16    Occupational History  . CHEMICAL ENGINEERING     [employer]   Social History Main Topics  . Smoking status: Former Smoker -- 22 years    Quit date: 02/15/1971  . Smokeless tobacco: Not on file  . Alcohol Use: No  . Drug Use: No  . Sexual Activity: Not on file   Other Topics Concern  . 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 MARRIED IN 72 FOR 16 YRS WIDOWED THEN REMARRIED IN 73. 1 SON , 2 DAUGHTERS. 8 GRANDCHILDREN. ACP: HCPOA - Lezlie Octave (daughter) (c) .  ACP - With an ICD In the event of electrical dissociation and cessation of heart function no further resuscitation. patient does not want to be maintained in a vegative state; does not want to be maintained with prolonged artificial hydration or nutrition; would not want HD; would not want prolonged mechanical ventilation. Referred to [email or web address]            ICD-[employer]      DESIGNATED PARTY RELEASE ON FILE: LATOYA BATTLE September 01, 2009 3:26 PM    Family Status  Relation Status Death Age  . Mother Deceased 8  . Father Deceased 27   Family History  Problem Relation Age of Onset  . Stroke Mother   . Heart attack Father     X 6  . Heart disease Father   . Rheum arthritis Sister   . Heart disease Brother      ROS:  Full 14 point review of systems complete and found to be negative unless listed above.  Physical Exam: Blood pressure 131/78, pulse 89, temperature 98 F (36.7 C), temperature source Oral, resp. rate 24, SpO2 98 %.  General: Well developed, well nourished, male in no acute distress Head: Eyes PERRLA, No xanthomas.   Normocephalic and atraumatic, oropharynx without edema or exudate. Dentition: Poor Lungs: Decreased breath sounds bases with rails Heart: Heart irregular rate and rhythm with S1, S2 , no murmur. pulses are 2+ all 4 extrem.   Neck: No carotid bruits. No  lymphadenopathy.  JVD to jaw Abdomen: Bowel sounds present, abdomen soft and non-tender without masses or hernias noted. Msk:  No spine or cva tenderness. No weakness, no joint deformities or effusions. Extremities: No clubbing or cyanosis. 2+ edema.  Neuro: Alert and oriented X 3. No focal deficits noted. Psych:  Good affect, responds appropriately Skin: No rashes or lesions noted.  Labs:   Lab Results  Component Value Date   WBC 8.6 04/30/2014   HGB 11.9* 04/23/2014   HCT 36.5* 04/28/2014   MCV 105.8* 05/13/2014   PLT 113* 04/22/2014   No results for input(s): INR in the last 72 hours.  Recent Labs Lab 05/03/2014 0959  NA 134*  K 4.6  CL 94*  CO2 24  BUN 53*  CREATININE 1.89*  CALCIUM 9.6  PROT 8.0  BILITOT 3.7*  ALKPHOS 124*  ALT 57*  AST 84*  GLUCOSE 158*  ALBUMIN 3.7   MAGNESIUM  Date Value Ref Range Status  03/13/2014 2.3 1.5 - 2.5 mg/dL Final    Recent Labs  16/10/96 1003  TROPIPOC 0.11*   No results found for: BNP PRO B NATRIURETIC PEPTIDE (BNP)  Date/Time Value Ref Range Status  04/11/2014 02:53 PM 1764.0* 0.0 - 100.0 pg/mL Final  03/13/2014 10:45 AM 1237.0* 0.0 - 100.0 pg/mL Final   TSH  Date/Time Value Ref Range Status  03/13/2014 10:45 AM 1.02 0.35 - 4.50 uIU/mL Final   VITAMIN B-12  Date/Time Value Ref Range Status  09/16/2013 07:19 AM 639 211 - 911 pg/mL Final    Echo: 12/12/2013 Study Conclusions - Left ventricle: The cavity size was mildly dilated. Systolic function was severely reduced. The estimated ejection fraction was in the range of 15-20%. Diffuse hypokinesis. - Aortic valve: Bicuspid; severely thickened, severely calcified leaflets. Cusp separation was severely reduced. There was mild regurgitation. - Aorta: Moderate non-mobile atherosclerotic plaque. Peak gradient of 43 mmHg is most probably underestimated on the current study. A transthoracic echocardiogram is recommended for evaluation of aortic stenosis.  Mildly dilated aortic root  measuring 44 mm and normal size ascending thoracic aorta. There was no evidence for dissection. - Descending aorta: The descending aorta was normal in size. - Mitral valve: There was mild to moderate regurgitation. - Left atrium: There is a large left atrial appendage. A heavy smoke is present in the left atrium and an organized thrombus measuring 20 x 11 mm is present in the left atrial appendage. The atrium was severely dilated. No evidence of thrombus in the atrial cavity or appendage. The appendage was morphologically a left appendage, multilobulated, and of normal size. Emptying velocity was moderately reduced. - Right ventricle: Systolic function was mildly reduced. - Right atrium: No evidence of thrombus in the atrial cavity or appendage. - Tricuspid valve: There was mild-moderate regurgitation. Impressions: - There was severe smoke present in the left atrium and an organized thrombus in the left atrial appendage. Cardioversion was not performed.  ECG:  04/25/2014 Atrial fib, controlled rate, LBBB is old  Radiology:  Dg Chest 2 View 05/10/2014   CLINICAL DATA:  Initial evaluation for weakness, shortness of breath, history of atrial fibrillation  EXAM: CHEST  2 VIEW  COMPARISON:  12/09/2013  FINDINGS: Moderate enlargement of cardiac silhouette. Moderate vascular congestion. Peribronchial wall thickening and mild interstitial prominence with mild hazy density right perihilar area. Chronic pleural parenchymal thickening left lung base. Cardiac defibrillator again noted.  IMPRESSION: Congestive heart failure with mild interstitial pulmonary edema   Electronically Signed   By: Esperanza Heiraymond  Rubner M.D.   On: 05/05/2014 10:43    ASSESSMENT AND PLAN:   The patient was seen today by Dr. Patty SermonsBrackbill, the patient evaluated and the data reviewed.  Active Problems:   Acute on chronic systolic CHF (congestive heart failure) - [name] is significantly  volume overloaded but also has worsening renal function with diuresis. - M.D. advise on Lasix 80 mg IV twice a day with close follow-up of his renal function, daily weights, intake and output. - He may need dobutamine    Weakness - This and other issues, per IM  Signed: Theodore Demarkhonda Barrett, PA-C 04/21/2014 2:16 PM Beeper [phone]  Co-Sign MD Patient seen in the emergency room.  History reviewed with patient and with his son-in-law Mr. Katrinka BlazingSmith.  The patient states that he has been out of rhythm for about 6 months.  He had a TEE in October 2015 which showed smoke and therefore cardioversion was not performed at that time.  He had had a subtherapeutic INR with Coumadin.  He was therefore switched to Eliquis and cardioverted successfully in November 2015.  However, he did not maintain normal sinus rhythm but reverted back to atrial fibrillation.  He was seen most recently in the office by Dr. Johney FrameAllred who felt that the patient was a poor candidate for catheter ablation of his atrial fibrillation.  Dr. Johney FrameAllred advised either long-term rate control or additional consideration of amiodarone.  However, the patient did not want to try amiodarone.  He is on Tikosyn.  He has had worsening CHF fasted by weakness, exertional dyspnea, and persistent peripheral edema.  He has not been having any chest discomfort.  I suspect that his mild elevation of troponin is secondary to supply demand mismatch secondary to his congestive heart failure and his atrial fibrillation.  His chest x-ray was personally reviewed and shows cardiomegaly.  His defibrillator is noted. On examination the patient has elevated jugular venous pressure.  He has diffuse inspiratory rales.  The heart reveals a grade 2/6 systolic ejection murmur at the left  sternal edge.  No diastolic murmur heard.  The liver is not palpably enlarged or tender.  Extremities showed 2+ lateral pretibial edema. Plan will be to switch his diuretic to Lasix 80 mg IV.  Hopefully  some of his recent worsening of renal function may be secondary to the heart failure itself and may improve with successful diuresis.  Continue current rate control with carvedilol, digoxin, Ranexa, and dofetilide.  Digoxin level will be checked.  Repeat echo has been ordered.  Follow liver function tests and renal function tests closely

## 2014-05-01 NOTE — ED Notes (Signed)
Bed: WA27 Expected date:  Expected time:  Means of arrival:  Comments: [place]

## 2014-05-02 ENCOUNTER — Inpatient Hospital Stay (HOSPITAL_COMMUNITY): Payer: Medicare Other

## 2014-05-02 DIAGNOSIS — N183 Chronic kidney disease, stage 3 unspecified: Secondary | ICD-10-CM | POA: Clinically undetermined

## 2014-05-02 DIAGNOSIS — N179 Acute kidney failure, unspecified: Secondary | ICD-10-CM | POA: Clinically undetermined

## 2014-05-02 DIAGNOSIS — I509 Heart failure, unspecified: Secondary | ICD-10-CM

## 2014-05-02 DIAGNOSIS — I481 Persistent atrial fibrillation: Secondary | ICD-10-CM

## 2014-05-02 DIAGNOSIS — N189 Chronic kidney disease, unspecified: Secondary | ICD-10-CM

## 2014-05-02 LAB — BASIC METABOLIC PANEL
Anion gap: 12 (ref 5–15)
BUN: 61 mg/dL — AB (ref 6–23)
CO2: 29 mmol/L (ref 19–32)
Calcium: 9.5 mg/dL (ref 8.4–10.5)
Chloride: 96 mmol/L (ref 96–112)
Creatinine, Ser: 2.02 mg/dL — ABNORMAL HIGH (ref 0.50–1.35)
GFR calc Af Amer: 33 mL/min — ABNORMAL LOW (ref >90)
GFR, EST NON AFRICAN AMERICAN: 28 mL/min — AB (ref >90)
Glucose, Bld: 139 mg/dL — ABNORMAL HIGH (ref 70–99)
Potassium: 4.4 mmol/L (ref 3.5–5.1)
Sodium: 137 mmol/L (ref 135–145)

## 2014-05-02 LAB — TROPONIN I
TROPONIN I: 0.16 ng/mL — AB (ref <0.031)
Troponin I: 0.15 ng/mL — ABNORMAL HIGH (ref <0.031)

## 2014-05-02 MED ORDER — SENNOSIDES-DOCUSATE SODIUM 8.6-50 MG PO TABS
1.0000 | ORAL_TABLET | Freq: Every day | ORAL | Status: DC
  Administered 2014-05-02 – 2014-05-05 (×4): 1 via ORAL
  Filled 2014-05-02 (×6): qty 1

## 2014-05-02 NOTE — Progress Notes (Signed)
PROGRESS NOTE  [name] NFA:[id number] DOB: 07-13-1926 DOA: 05/12/2014 PCP: Sonda Primes, MD  Assessment/Plan: Acute on chronic systolic CHF -Patient remains clinically volume overloaded in setting of renal failure -Continue furosemide 80 mg IV daily -Monitor renal function -Notably, patient had a weight of 182 pounds on 04/09/2014. Weight was 174 pounds on 04/29/2014 -05/02/14 weight--169 lbs -12/12/2013 echocardiogram EF 20-25%, LA thrombus -Continue carvedilol -await repeat Echo Elevated troponin -Doubt ACS -Likely elevated in the setting of worsening renal failure and decompensated CHF -appreciate cardiology followup Acute on chronic renal failure (CKD stage III) -Baseline creatinine 1.5-1.8 -Difficult situation as the patient has an EF 20%--likely has cardiorenal syndrome -Monitor renal function with diuresis -Hold losartan -Abdominal ultrasound to assess for hydronephrosis Ischemic cardiomyopathy  -As discussed above  -continue Ranexa -Continue digoxin--dig level 1.0 -s/p AICD Persistent atrial fibrillation  -Rate controlled  -Continue Tikosyn -Continue apixiban--difficult situation as the patient has a left atrial thrombus, recent history of falls Tranaminasemia -Likely due to chronic hepatic congestion from the patient's ischemic cardiomyopathy -Abdominal ultrasound--cholelithiasis without cholecystitis, fatty liver, no hydronephrosis Thrombocytopenia -Suspect this is due to chronic hepatic congestion -This has been chronic -Monitor for signs of bleeding -serum B12--858 -Abdominal ultrasound to evaluate for cirrhosis, splenomegaly Generalized weakness -Multifactorial including decompensated CHF, deconditioning, renal failure -UA and urine culture--shows pyuria, await culture Pyuria -await culture -pt asymptomatic without fever or leukocytosis   Family Communication:   Daughter updated at beside Disposition Plan:   Home when medically  stable      Procedures/Studies: Dg Chest 2 View  05/15/2014   CLINICAL DATA:  Initial evaluation for weakness, shortness of breath, history of atrial fibrillation  EXAM: CHEST  2 VIEW  COMPARISON:  12/09/2013  FINDINGS: Moderate enlargement of cardiac silhouette. Moderate vascular congestion. Peribronchial wall thickening and mild interstitial prominence with mild hazy density right perihilar area. Chronic pleural parenchymal thickening left lung base. Cardiac defibrillator again noted.  IMPRESSION: Congestive heart failure with mild interstitial pulmonary edema   Electronically Signed   By: Esperanza Heir M.D.   On: 04/19/2014 10:43   Ct Head Wo Contrast  04/14/2014   CLINICAL DATA:  Status post fall; hit right superior orbital rim. Patient on blood thinners. Initial encounter.  EXAM: CT HEAD WITHOUT CONTRAST  TECHNIQUE: Contiguous axial images were obtained from the base of the skull through the vertex without intravenous contrast.  COMPARISON:  CT of the head performed 01/07/2005, and CTA of the head performed 04/10/2007  FINDINGS: There is no evidence of acute infarction, mass lesion, or intra- or extra-axial hemorrhage on CT.  Prominence of the ventricles and sulci reflects mild cortical volume loss. Cerebellar atrophy is noted. Mild periventricular and subcortical white matter change likely reflects small vessel ischemic microangiopathy.  The brainstem and fourth ventricle are within normal limits. The basal ganglia are unremarkable in appearance. The cerebral hemispheres demonstrate grossly normal gray-white differentiation. No mass effect or midline shift is seen.  There is no evidence of fracture; visualized osseous structures are unremarkable in appearance. The visualized portions of the orbits are within normal limits. The paranasal sinuses and mastoid air cells are well-aerated. Soft tissue swelling is noted overlying the right frontal calvarium, just superior to the right orbit.  IMPRESSION:  1. No evidence of traumatic intracranial injury or fracture. 2. Mild cortical volume loss and scattered small vessel ischemic microangiopathy. 3. Soft tissue swelling overlying the right frontal calvarium, just superior to the right orbit.  Electronically Signed   By: Roanna RaiderJeffery  Chang M.D.   On: 04/14/2014 22:22   [place] Abdomen Complete  05/02/2014   CLINICAL DATA:  Transaminasemia, hyperbilirubinemia, acute on chronic renal failure  EXAM: ULTRASOUND ABDOMEN COMPLETE  COMPARISON:  CT 01/12/2009  FINDINGS: Gallbladder: There are multiple calculi in the gallbladder lumen measuring up to 11 mm. There is no gallbladder wall thickening or pericholecystic fluid. The patient was not tender over the gallbladder.  Common bile duct: Diameter: 3.6 mm, normal  Liver: There is increased echogenicity of hepatic parenchyma consistent with fatty infiltration. No focal liver lesions are evident.  IVC: No abnormality visualized.  Pancreas: Not visualized  Spleen: Size and appearance within normal limits.  Right Kidney: Length: 10.7 cm. Echogenicity within normal limits. No mass or hydronephrosis visualized.  Left Kidney: Length: 13.1 cm. Echogenicity within normal limits. No mass or hydronephrosis visualized.  Abdominal aorta: Not visible  Other findings: Bilateral pleural effusions  IMPRESSION: 1. Cholelithiasis without evidence of cholecystitis 2. Dense liver, likely fatty infiltration 3. Nonvisualization of the pancreas and aorta 4. Unremarkable kidneys, negative for hydronephrosis 5. Bilateral pleural effusions   Electronically Signed   By: Ellery Plunkaniel R Mitchell M.D.   On: 05/02/2014 06:05         Subjective: Patient still has dyspnea on exertion. Denies any chest pain, nausea, vomiting, diarrhea, abdominal pain, dysuria.  Objective: Filed Vitals:   02-20-14 1743 02-20-14 1918 02-20-14 2033 05/02/14 0928  BP: 94/78  126/79   Pulse: 99 88 103 74  Temp: 98 F (36.7 C)  97.5 F (36.4 C)   TempSrc: Oral  Oral   Resp:  22  22   Height:      Weight:      SpO2: 99%  100%     Intake/Output Summary (Last 24 hours) at 05/02/14 1236 Last data filed at 05/02/14 1031  Gross per 24 hour  Intake    243 ml  Output      0 ml  Net    243 ml   Weight change:  Exam:   General:  Pt is alert, follows commands appropriately, not in acute distress  HEENT: No icterus, No thrush,[place]/AT  Cardiovascular: RRR, S1/S2, no rubs, no gallops  Respiratory: Bilateral scattered rales. No wheeze  Abdomen: Soft/+BS, non tender, non distended, no guarding  Extremities: 2+LE edema, No lymphangitis, No petechiae, No rashes, no synovitis  Data Reviewed: Basic Metabolic Panel:  Recent Labs Lab 04/29/14 1037 02-20-14 0959 02-20-14 1459 05/02/14 0440  NA 135 134*  --  137  K 4.4 4.6  --  4.4  CL 96 94*  --  96  CO2 35* 24  --  29  GLUCOSE 110* 158*  --  139*  BUN 44* 53*  --  61*  CREATININE 1.82* 1.89*  --  2.02*  CALCIUM 9.9 9.6  --  9.5  MG  --   --  2.6*  --    Liver Function Tests:  Recent Labs Lab 02-20-14 0959  AST 84*  ALT 57*  ALKPHOS 124*  BILITOT 3.7*  PROT 8.0  ALBUMIN 3.7   No results for input(s): LIPASE, AMYLASE in the last 168 hours. No results for input(s): AMMONIA in the last 168 hours. CBC:  Recent Labs Lab 02-20-14 0959  WBC 8.6  NEUTROABS 7.0  HGB 11.9*  HCT 36.5*  MCV 105.8*  PLT 113*   Cardiac Enzymes:  Recent Labs Lab 02-20-14 1459 02-20-14 1830 05/02/14 0110 05/02/14 0440  TROPONINI 0.12* 0.12*  0.15* 0.16*   BNP: Invalid input(s): POCBNP CBG: No results for input(s): GLUCAP in the last 168 hours.  No results found for this or any previous visit (from the past 240 hour(s)).   Scheduled Meds: . acetaminophen  1,000 mg Oral q morning - 10a  . apixaban  2.5 mg Oral BID  . carvedilol  6.25 mg Oral BID WC  . digoxin  62.5 mcg Oral Daily  . furosemide  80 mg Intravenous Daily  . multivitamin with minerals  1 tablet Oral Daily  . ranolazine  500 mg Oral BID   . sodium chloride  1 spray Each Nare Daily  . sodium chloride  3 mL Intravenous Q12H   Continuous Infusions:    Taraoluwa Thakur, DO  [facility] Pager [phone]  If 7PM-7AM, please contact night-coverage [email or web address] Password [facility] 05/02/2014, 12:36 PM   LOS: 1 day

## 2014-05-02 NOTE — Progress Notes (Signed)
  Echocardiogram 2D Echocardiogram has been performed.  Leta JunglingCooper, Khalaya Mcgurn M 05/02/2014, 12:51 PM

## 2014-05-02 NOTE — Care Management Note (Addendum)
    Page 1 of 1   05/06/2014     10:06:10 AM CARE MANAGEMENT NOTE 05/06/2014  Patient:  [name],[name]   Account Number:  [id number]  Date Initiated:  05/02/2014  Documentation initiated by:  Lanier ClamMAHABIR,KATHY  Subjective/Objective Assessment:   81 y/o m admitted w/CHF.     Action/Plan:   From home.   Anticipated DC Date:  05/09/2014   Anticipated DC Plan:  HOME W HOME HEALTH SERVICES  In-house referral  Clinical Social Worker      DC Planning Services  CM consult      Choice offered to / List presented to:             Status of service:  Completed, signed off Medicare Important Message given?  YES (If response is "NO", the following Medicare IM given date fields will be blank) Date Medicare IM given:  05/06/2014 Medicare IM given by:  Junius CreamerWELL,DEBBIE Date Additional Medicare IM given:   Additional Medicare IM given by:    Discharge Disposition:    Per UR Regulation:  Reviewed for med. necessity/level of care/duration of stay  If discussed at Long Length of Stay Meetings, dates discussed:    Comments:  3/22  1003 debbie Japheth Diekman rn,bsn spoke w da. pt lives at home and has help hired several hrs per week. hx of adv homecare for hhc. da feeling like may need rehab and md felt may need rehab also. da to speak w pt about snf and req sw consult. son in law will be here on 3-23 and da req sw speak w her on thurs or can call her about rehab placemnt before they speak w pt. just want to be sure fam has talked w pt before sw does.  05/02/14 Lanier ClamKathy Mahabir RN [facility]Sn NCM [phone] Await PT/OT recommendations.

## 2014-05-02 NOTE — Progress Notes (Signed)
SUBJECTIVE:  No complaints  OBJECTIVE:   Vitals:   Filed Vitals:   04/15/2014 1742 04/17/2014 1743 04/15/2014 1918 05/10/2014 2033  BP:  94/78  126/79  Pulse:  99 88 103  Temp:  98 F (36.7 C)  97.5 F (36.4 C)  TempSrc:  Oral  Oral  Resp:  22  22  Height: 6\' 1"  (1.854 m)     Weight: 169 lb 8.5 oz (76.9 kg)     SpO2:  99%  100%   I&O's:   Intake/Output Summary (Last 24 hours) at 05/02/14 0755 Last data filed at 05/06/2014 2041  Gross per 24 hour  Intake      3 ml  Output      0 ml  Net      3 ml   TELEMETRY: Reviewed telemetry pt in atrial fibrillation:     PHYSICAL EXAM General: Well developed, well nourished, in no acute distress Head: hematoma over right eye  Lungs:   Crackles at bases bilaterally Heart:   Irregularly irregular S1 S2 Pulses are 2+ & equal. Abdomen: Bowel sounds are positive, abdomen soft and non-tender without masses  Extremities:   1+ edema Neuro: Alert and oriented X 3. Psych:  Good affect, responds appropriately   LABS: Basic Metabolic Panel:  Recent Labs  29/56/2103/16/2016 0959 05/13/2014 1459 05/02/14 0440  NA 134*  --  137  K 4.6  --  4.4  CL 94*  --  96  CO2 24  --  29  GLUCOSE 158*  --  139*  BUN 53*  --  61*  CREATININE 1.89*  --  2.02*  CALCIUM 9.6  --  9.5  MG  --  2.6*  --    Liver Function Tests:  Recent Labs  04/30/2014 0959  AST 84*  ALT 57*  ALKPHOS 124*  BILITOT 3.7*  PROT 8.0  ALBUMIN 3.7   No results for input(s): LIPASE, AMYLASE in the last 72 hours. CBC:  Recent Labs  04/20/2014 0959  WBC 8.6  NEUTROABS 7.0  HGB 11.9*  HCT 36.5*  MCV 105.8*  PLT 113*   Cardiac Enzymes:  Recent Labs  05/07/2014 1830 05/02/14 0110 05/02/14 0440  TROPONINI 0.12* 0.15* 0.16*   BNP: Invalid input(s): POCBNP D-Dimer: No results for input(s): DDIMER in the last 72 hours. Hemoglobin A1C: No results for input(s): HGBA1C in the last 72 hours. Fasting Lipid Panel: No results for input(s): CHOL, HDL, LDLCALC, TRIG, CHOLHDL,  LDLDIRECT in the last 72 hours. Thyroid Function Tests:  Recent Labs  04/30/2014 1537  TSH 0.711   Anemia Panel:  Recent Labs  05/12/2014 1536  VITAMINB12 858   Coag Panel:   Lab Results  Component Value Date   INR 1.98* 12/13/2013   INR 1.70* 12/12/2013   INR 1.8 12/11/2013   PROTIME 17.3 07/31/2008    RADIOLOGY: Dg Chest 2 View  04/24/2014   CLINICAL DATA:  Initial evaluation for weakness, shortness of breath, history of atrial fibrillation  EXAM: CHEST  2 VIEW  COMPARISON:  12/09/2013  FINDINGS: Moderate enlargement of cardiac silhouette. Moderate vascular congestion. Peribronchial wall thickening and mild interstitial prominence with mild hazy density right perihilar area. Chronic pleural parenchymal thickening left lung base. Cardiac defibrillator again noted.  IMPRESSION: Congestive heart failure with mild interstitial pulmonary edema   Electronically Signed   By: Esperanza Heiraymond  Rubner M.D.   On: 05/07/2014 10:43   Ct Head Wo Contrast  04/14/2014   CLINICAL DATA:  Status post fall;  hit right superior orbital rim. Patient on blood thinners. Initial encounter.  EXAM: CT HEAD WITHOUT CONTRAST  TECHNIQUE: Contiguous axial images were obtained from the base of the skull through the vertex without intravenous contrast.  COMPARISON:  CT of the head performed 01/07/2005, and CTA of the head performed 04/10/2007  FINDINGS: There is no evidence of acute infarction, mass lesion, or intra- or extra-axial hemorrhage on CT.  Prominence of the ventricles and sulci reflects mild cortical volume loss. Cerebellar atrophy is noted. Mild periventricular and subcortical white matter change likely reflects small vessel ischemic microangiopathy.  The brainstem and fourth ventricle are within normal limits. The basal ganglia are unremarkable in appearance. The cerebral hemispheres demonstrate grossly normal gray-white differentiation. No mass effect or midline shift is seen.  There is no evidence of fracture;  visualized osseous structures are unremarkable in appearance. The visualized portions of the orbits are within normal limits. The paranasal sinuses and mastoid air cells are well-aerated. Soft tissue swelling is noted overlying the right frontal calvarium, just superior to the right orbit.  IMPRESSION: 1. No evidence of traumatic intracranial injury or fracture. 2. Mild cortical volume loss and scattered small vessel ischemic microangiopathy. 3. Soft tissue swelling overlying the right frontal calvarium, just superior to the right orbit.   Electronically Signed   By: Roanna Raider M.D.   On: 04/14/2014 22:22   US Abdomen Complete  05/02/2014   CLINICAL DATA:  Transaminasemia, hyperbilirubinemia, acute on chronic renal failure  EXAM: ULTRASOUND ABDOMEN COMPLETE  COMPARISON:  CT 01/12/2009  FINDINGS: Gallbladder: There are multiple calculi in the gallbladder lumen measuring up to 11 mm. There is no gallbladder wall thickening or pericholecystic fluid. The patient was not tender over the gallbladder.  Common bile duct: Diameter: 3.6 mm, normal  Liver: There is increased echogenicity of hepatic parenchyma consistent with fatty infiltration. No focal liver lesions are evident.  IVC: No abnormality visualized.  Pancreas: Not visualized  Spleen: Size and appearance within normal limits.  Right Kidney: Length: 10.7 cm. Echogenicity within normal limits. No mass or hydronephrosis visualized.  Left Kidney: Length: 13.1 cm. Echogenicity within normal limits. No mass or hydronephrosis visualized.  Abdominal aorta: Not visible  Other findings: Bilateral pleural effusions  IMPRESSION: 1. Cholelithiasis without evidence of cholecystitis 2. Dense liver, likely fatty infiltration 3. Nonvisualization of the pancreas and aorta 4. Unremarkable kidneys, negative for hydronephrosis 5. Bilateral pleural effusions   Electronically Signed   By: Ellery Plunk M.D.   On: 05/02/2014 06:05   ASSESSMENT AND PLAN:   1.  Acute on  chronic systolic CHF (congestive heart failure) - still volume overloaded on exam. Creatinine has bumped but still has rales on exam.  Continue IV diuretics. Hopefully renal function will improve with diuresis.   I&O's incomplete  2.  Weakness   3.  Chronic atrial fibrillation - rate controlled - continue coreg, dig. According to Dr. Jenel Lucks note the patient was a poor candidate for catheter ablation of his atrial fibrillation. Dr. Johney Frame advised either long-term rate control or additional consideration of amiodarone. However, the patient did not want to try amiodarone. He is on Tikosyn but is now back in atrial fibrillation.  I think that this time best option is to stop Tikosyn and maintain rate control with coreg and dig.   4.  Chronic systemic anticoagulation with Eliquis - not sure he is a good long term anticoagulation candidate. He has fallen recently several times with a laceration above his right eye with hematoma  from a fall recently and then fell again in the bathroom and hit his hip and has a large bruise.  He says that he fell because he took the trash out and his oxygen tank pulled him over.  He also fell after sitting on the commode for a long time and stood up and his legs were numb.  He walks with a walker.   5.  Elevated troponin felt secondary to supply demand mismatch from CHF and afib   Quintella Reichert, MD  05/02/2014  7:55 AM

## 2014-05-03 ENCOUNTER — Inpatient Hospital Stay (HOSPITAL_COMMUNITY): Payer: Medicare Other

## 2014-05-03 LAB — URINE CULTURE: Colony Count: 70000

## 2014-05-03 LAB — CBC
HEMATOCRIT: 33.9 % — AB (ref 39.0–52.0)
Hemoglobin: 11.1 g/dL — ABNORMAL LOW (ref 13.0–17.0)
MCH: 34.8 pg — ABNORMAL HIGH (ref 26.0–34.0)
MCHC: 32.7 g/dL (ref 30.0–36.0)
MCV: 106.3 fL — AB (ref 78.0–100.0)
PLATELETS: 82 10*3/uL — AB (ref 150–400)
RBC: 3.19 MIL/uL — ABNORMAL LOW (ref 4.22–5.81)
RDW: 17.8 % — ABNORMAL HIGH (ref 11.5–15.5)
WBC: 8.4 10*3/uL (ref 4.0–10.5)

## 2014-05-03 LAB — BASIC METABOLIC PANEL
Anion gap: 14 (ref 5–15)
BUN: 79 mg/dL — AB (ref 6–23)
CO2: 27 mmol/L (ref 19–32)
Calcium: 9.5 mg/dL (ref 8.4–10.5)
Chloride: 95 mmol/L — ABNORMAL LOW (ref 96–112)
Creatinine, Ser: 2.35 mg/dL — ABNORMAL HIGH (ref 0.50–1.35)
GFR calc Af Amer: 27 mL/min — ABNORMAL LOW (ref >90)
GFR, EST NON AFRICAN AMERICAN: 23 mL/min — AB (ref >90)
Glucose, Bld: 143 mg/dL — ABNORMAL HIGH (ref 70–99)
POTASSIUM: 4.1 mmol/L (ref 3.5–5.1)
SODIUM: 136 mmol/L (ref 135–145)

## 2014-05-03 LAB — BRAIN NATRIURETIC PEPTIDE: B NATRIURETIC PEPTIDE 5: 2081.1 pg/mL — AB (ref 0.0–100.0)

## 2014-05-03 LAB — MRSA PCR SCREENING: MRSA by PCR: NEGATIVE

## 2014-05-03 MED ORDER — RANOLAZINE ER 500 MG PO TB12
500.0000 mg | ORAL_TABLET | Freq: Two times a day (BID) | ORAL | Status: DC
  Administered 2014-05-03: 500 mg via ORAL
  Filled 2014-05-03: qty 1

## 2014-05-03 NOTE — Progress Notes (Signed)
SUBJECTIVE:  Patient states breathing is no better  OBJECTIVE:   Vitals:   Filed Vitals:   05/02/14 0928 05/02/14 1328 05/02/14 2248 05/03/14 0518  BP:  104/54 107/66 117/71  Pulse: 74 74 69 71  Temp:  98.4 F (36.9 C) 97.5 F (36.4 C) 97.5 F (36.4 C)  TempSrc:  Oral Oral Oral  Resp:  Height:      Weight:    165 lb 2 oz (74.9 kg)  SpO2:  100% 100% 98%   I&O's:   Intake/Output Summary (Last 24 hours) at 05/03/14 0748 Last data filed at 05/02/14 2249  Gross per 24 hour  Intake    480 ml  Output    600 ml  Net   -120 ml   TELEMETRY: Reviewed telemetry pt in atrial fibrilaltion:     PHYSICAL EXAM General: Well developed, well nourished, in no acute distress Head: Eyes PERRLA, No xanthomas.   Normal cephalic and atramatic  Lungs:   Crackles at bases Heart:   Irregularly irregular  S1 S2 Pulses are 2+ & equal. Abdomen: Bowel sounds are positive, abdomen soft and non-tender without masses Extremities:   2+ pitting edema of LE Neuro: Alert and oriented X 3. Psych:  Good affect, responds appropriately   LABS: Basic Metabolic Panel:  Recent Labs  16/10/96 1459 05/02/14 0440 05/03/14 0454  NA  --  137 136  K  --  4.4 4.1  CL  --  96 95*  CO2  --  29 27  GLUCOSE  --  139* 143*  BUN  --  61* 79*  CREATININE  --  2.02* 2.35*  CALCIUM  --  9.5 9.5  MG 2.6*  --   --    Liver Function Tests:  Recent Labs  05-04-14 0959  AST 84*  ALT 57*  ALKPHOS 124*  BILITOT 3.7*  PROT 8.0  ALBUMIN 3.7   No results for input(s): LIPASE, AMYLASE in the last 72 hours. CBC:  Recent Labs  05-04-2014 0959 05/03/14 0454  WBC 8.6 8.4  NEUTROABS 7.0  --   HGB 11.9* 11.1*  HCT 36.5* 33.9*  MCV 105.8* 106.3*  PLT 113* 82*   Cardiac Enzymes:  Recent Labs  2014-05-04 1830 05/02/14 0110 05/02/14 0440  TROPONINI 0.12* 0.15* 0.16*   BNP: Invalid input(s): POCBNP D-Dimer: No results for input(s): DDIMER in the last 72 hours. Hemoglobin A1C: No results for  input(s): HGBA1C in the last 72 hours. Fasting Lipid Panel: No results for input(s): CHOL, HDL, LDLCALC, TRIG, CHOLHDL, LDLDIRECT in the last 72 hours. Thyroid Function Tests:  Recent Labs  05-04-2014 1537  TSH 0.711   Anemia Panel:  Recent Labs  04-May-2014 1536  VITAMINB12 858   Coag Panel:   Lab Results  Component Value Date   INR 1.98* 12/13/2013   INR 1.70* 12/12/2013   INR 1.8 12/11/2013   PROTIME 17.3 07/31/2008    RADIOLOGY: Dg Chest 2 View  04-May-2014   CLINICAL DATA:  Initial evaluation for weakness, shortness of breath, history of atrial fibrillation  EXAM: CHEST  2 VIEW  COMPARISON:  12/09/2013  FINDINGS: Moderate enlargement of cardiac silhouette. Moderate vascular congestion. Peribronchial wall thickening and mild interstitial prominence with mild hazy density right perihilar area. Chronic pleural parenchymal thickening left lung base. Cardiac defibrillator again noted.  IMPRESSION: Congestive heart failure with mild interstitial pulmonary edema   Electronically Signed   By: Esperanza Heir M.D.   On: 04-May-2014 10:43  Ct Head Wo Contrast  04/14/2014   CLINICAL DATA:  Status post fall; hit right superior orbital rim. Patient on blood thinners. Initial encounter.  EXAM: CT HEAD WITHOUT CONTRAST  TECHNIQUE: Contiguous axial images were obtained from the base of the skull through the vertex without intravenous contrast.  COMPARISON:  CT of the head performed 01/07/2005, and CTA of the head performed 04/10/2007  FINDINGS: There is no evidence of acute infarction, mass lesion, or intra- or extra-axial hemorrhage on CT.  Prominence of the ventricles and sulci reflects mild cortical volume loss. Cerebellar atrophy is noted. Mild periventricular and subcortical white matter change likely reflects small vessel ischemic microangiopathy.  The brainstem and fourth ventricle are within normal limits. The basal ganglia are unremarkable in appearance. The cerebral hemispheres demonstrate  grossly normal gray-white differentiation. No mass effect or midline shift is seen.  There is no evidence of fracture; visualized osseous structures are unremarkable in appearance. The visualized portions of the orbits are within normal limits. The paranasal sinuses and mastoid air cells are well-aerated. Soft tissue swelling is noted overlying the right frontal calvarium, just superior to the right orbit.  IMPRESSION: 1. No evidence of traumatic intracranial injury or fracture. 2. Mild cortical volume loss and scattered small vessel ischemic microangiopathy. 3. Soft tissue swelling overlying the right frontal calvarium, just superior to the right orbit.   Electronically Signed   By: Roanna RaiderJeffery  Chang M.D.   On: 04/14/2014 22:22   [place] Abdomen Complete  05/02/2014   CLINICAL DATA:  Transaminasemia, hyperbilirubinemia, acute on chronic renal failure  EXAM: ULTRASOUND ABDOMEN COMPLETE  COMPARISON:  CT 01/12/2009  FINDINGS: Gallbladder: There are multiple calculi in the gallbladder lumen measuring up to 11 mm. There is no gallbladder wall thickening or pericholecystic fluid. The patient was not tender over the gallbladder.  Common bile duct: Diameter: 3.6 mm, normal  Liver: There is increased echogenicity of hepatic parenchyma consistent with fatty infiltration. No focal liver lesions are evident.  IVC: No abnormality visualized.  Pancreas: Not visualized  Spleen: Size and appearance within normal limits.  Right Kidney: Length: 10.7 cm. Echogenicity within normal limits. No mass or hydronephrosis visualized.  Left Kidney: Length: 13.1 cm. Echogenicity within normal limits. No mass or hydronephrosis visualized.  Abdominal aorta: Not visible  Other findings: Bilateral pleural effusions  IMPRESSION: 1. Cholelithiasis without evidence of cholecystitis 2. Dense liver, likely fatty infiltration 3. Nonvisualization of the pancreas and aorta 4. Unremarkable kidneys, negative for hydronephrosis 5. Bilateral pleural effusions    Electronically Signed   By: Ellery Plunkaniel R Mitchell M.D.   On: 05/02/2014 06:05   ASSESSMENT AND PLAN:   1. Acute on chronic systolic CHF (congestive heart failure) - still volume overloaded on exam with crackles in lungs and LE edema. Creatinine has bumped more today. Suspect he has cardiorenal syndrome.  Hold IV diuretics today.  Check BNP.  [facility] more aggressive treatment of CHF.  Would like to get advanced heart failure consult.  2D echo showed severe AS which is most likely contributing to his acute CHF exacerbation.  Not sure he would be a TAVR candidate.  May need to consider Palliative Care.  Will get CHF service input.  2. Weakness  3. Chronic atrial fibrillation - rate controlled - continue coreg, dig. According to Dr. Jenel LucksAllred's note the patient was a poor candidate for catheter ablation of his atrial fibrillation. Dr. Johney FrameAllred advised either long-term rate control or additional consideration of amiodarone. However, the patient  did not want to try amiodarone. He is on Tikosyn but is now back in atrial fibrillation. I think that this time best option is to stop Tikosyn and maintain rate control with coreg and dig.  4. Chronic systemic anticoagulation with Eliquis - not sure he is a good long term anticoagulation candidate. He has fallen recently several times with a laceration above his right eye with hematoma from a fall recently and then fell again in the bathroom and hit his hip and has a large bruise. He says that he fell because he took the trash out and his oxygen tank pulled him over. He also fell after sitting on the commode for a long time and stood up and his legs were numb. He walks with a walker.  5. Elevated troponin felt secondary to supply demand mismatch from CHF and afib   6.  Ischemic DCM with EF 20-25% s/p AICD   7.  ASCAD s/p remote CABG 1991   8.  AAA s/p stent graft   9.  CKD stage III with worsening creatinine likely  cardiorenal syndrome  Quintella Reichert, MD  05/03/2014  7:48 AM

## 2014-05-03 NOTE — Progress Notes (Signed)
Spoke with family again at bedside.  Informed them again pt is being transferred to [place] later today. Also informed son-in-law that Joice Loftsikosyn is being dosed and managed by cardiology.  Asked RN to page Dr. Mayford Knifeurner to clarify.  DTat

## 2014-05-03 NOTE — Progress Notes (Addendum)
PROGRESS NOTE  [name] BoyerJohn E Brannan QMV:[id number] DOB: [birth year] DOA: 05/15/2014 PCP: Sonda PrimesAlex Plotnikov, [name]  Brief history 81 year old male with a history of persistent atrial fibrillation, ischemic cardiomyopathy, CKD stage III presents with 3 days history of dyspnea on exertion and worsening generalized weakness.  The patient lives by himself, but has a caretaker that helps him with his medications. He has been using a walker to ambulate, but in the past 2 weeks he has had at least 2 falls. The patient was taking torsemide 60 mg daily prior to admission which was a new dose as of 04/29/2014. The patient was admitted for acute on chronic systolic CHF with EF of 20-25%. Unfortunately, the patient likely has cardiorenal syndrome with worsening renal function with diuresis. Cardiology was consulted and [facility] team to assist in management.  Assessment/Plan: Acute on chronic systolic CHF -Patient remains clinically volume overloaded in setting of renal failure -started on furosemide 80 mg IV daily-->held today (3/19) by cardiology -Monitor renal function--continues to worsen -Notably, patient had a weight of 182 pounds on 04/09/2014. Weight was 174 pounds on 04/29/2014 -05/03/14 weight--165 lbs -12/12/2013 echocardiogram EF 20-25%, LA thrombus -Continue carvedilol -05/02/14--repeat Echo--EF 20-25%, severe AS, diffuse HK Elevated troponin -Doubt ACS -Likely elevated in the setting of worsening renal failure and decompensated CHF -appreciate cardiology followup Acute on chronic renal failure (CKD stage III) -Baseline creatinine 1.5-1.8 -Difficult situation as the patient has an EF 20%--likely has cardiorenal syndrome -Monitor renal function with diuresis--creatinine 2.35 today -Hold losartan -Abdominal ultrasound to assess for hydronephrosis--negative hydronephrosis, cholelithiasis without cholecystitis, fatty liver Ischemic cardiomyopathy  -As discussed above   -Ranexa presently held -Continue digoxin--dig level 1.0 -s/p AICD Persistent atrial fibrillation  -Rate controlled  -Tikosyn--stopped by cardiology 3/18 -Continue apixiban--difficult situation as the patient has a left atrial thrombus, recent history of falls--now with hematoma and lac R-periorbital area--stable Tranaminasemia -Likely due to chronic hepatic congestion from the patient's ischemic cardiomyopathy -Abdominal ultrasound--cholelithiasis without cholecystitis, fatty liver, no hydronephrosis Thrombocytopenia -Suspect this is due to chronic hepatic congestion -This has been chronic -Monitor for signs of bleeding -serum B12--858 -Abdominal ultrasound--no splenomegaly Generalized weakness -Multifactorial including decompensated CHF, deconditioning, renal failure -UA and urine culture--shows pyuria, but culture only 70K--likely contaminated and pt is asymptomatic--will NOT start abx at this time Pyuria -await culture -pt asymptomatic without fever or leukocytosis -UA and urine culture--shows pyuria, but culture only 70K--likely contaminated and pt is asymptomatic--will NOT start abx at this time  Family Communication: Daughter Vikki PortsValerie (HPOA) updated on phone 3/19--[facility]         Procedures/Studies: Dg Chest 2 View  04/29/2014   CLINICAL DATA:  Initial evaluation for weakness, shortness of breath, history of atrial fibrillation  EXAM: CHEST  2 VIEW  COMPARISON:  12/09/2013  FINDINGS: Moderate enlargement of cardiac silhouette. Moderate vascular congestion. Peribronchial wall thickening and mild interstitial prominence with mild hazy density right perihilar area. Chronic pleural parenchymal thickening left lung base. Cardiac defibrillator again noted.  IMPRESSION: Congestive heart failure with mild interstitial pulmonary edema   Electronically Signed   By: Esperanza Heiraymond  Rubner M.D.   On: 04/23/2014 10:43   Ct  Head Wo Contrast  04/14/2014   CLINICAL DATA:  Status post fall; hit right superior orbital rim. Patient on blood thinners. Initial encounter.  EXAM: CT HEAD WITHOUT CONTRAST  TECHNIQUE: Contiguous axial images were obtained from the base of the  skull through the vertex without intravenous contrast.  COMPARISON:  CT of the head performed 01/07/2005, and CTA of the head performed 04/10/2007  FINDINGS: There is no evidence of acute infarction, mass lesion, or intra- or extra-axial hemorrhage on CT.  Prominence of the ventricles and sulci reflects mild cortical volume loss. Cerebellar atrophy is noted. Mild periventricular and subcortical white matter change likely reflects small vessel ischemic microangiopathy.  The brainstem and fourth ventricle are within normal limits. The basal ganglia are unremarkable in appearance. The cerebral hemispheres demonstrate grossly normal gray-white differentiation. No mass effect or midline shift is seen.  There is no evidence of fracture; visualized osseous structures are unremarkable in appearance. The visualized portions of the orbits are within normal limits. The paranasal sinuses and mastoid air cells are well-aerated. Soft tissue swelling is noted overlying the right frontal calvarium, just superior to the right orbit.  IMPRESSION: 1. No evidence of traumatic intracranial injury or fracture. 2. Mild cortical volume loss and scattered small vessel ischemic microangiopathy. 3. Soft tissue swelling overlying the right frontal calvarium, just superior to the right orbit.   Electronically Signed   By: Roanna Raider M.D.   On: 04/14/2014 22:22   US Abdomen Complete  05/02/2014   CLINICAL DATA:  Transaminasemia, hyperbilirubinemia, acute on chronic renal failure  EXAM: ULTRASOUND ABDOMEN COMPLETE  COMPARISON:  CT 01/12/2009  FINDINGS: Gallbladder: There are multiple calculi in the gallbladder lumen measuring up to 11 mm. There is no gallbladder wall thickening or pericholecystic  fluid. The patient was not tender over the gallbladder.  Common bile duct: Diameter: 3.6 mm, normal  Liver: There is increased echogenicity of hepatic parenchyma consistent with fatty infiltration. No focal liver lesions are evident.  IVC: No abnormality visualized.  Pancreas: Not visualized  Spleen: Size and appearance within normal limits.  Right Kidney: Length: 10.7 cm. Echogenicity within normal limits. No mass or hydronephrosis visualized.  Left Kidney: Length: 13.1 cm. Echogenicity within normal limits. No mass or hydronephrosis visualized.  Abdominal aorta: Not visible  Other findings: Bilateral pleural effusions  IMPRESSION: 1. Cholelithiasis without evidence of cholecystitis 2. Dense liver, likely fatty infiltration 3. Nonvisualization of the pancreas and aorta 4. Unremarkable kidneys, negative for hydronephrosis 5. Bilateral pleural effusions   Electronically Signed   By: Ellery Plunk M.D.   On: 05/02/2014 06:05        Subjective: Patient states that his breathing is much better. Denies any nausea, vomiting, diarrhea, pain, chest pain, cough, hemoptysis, fevers, chills.  Objective: Filed Vitals:   05/02/14 0928 05/02/14 1328 05/02/14 2248 05/03/14 0518  BP:  104/54 107/66 117/71  Pulse: 74 74 69 71  Temp:  98.4 F (36.9 C) 97.5 F (36.4 C) 97.5 F (36.4 C)  TempSrc:  Oral Oral Oral  Resp:  Height:      Weight:    74.9 kg (165 lb 2 oz)  SpO2:  100% 100% 98%    Intake/Output Summary (Last 24 hours) at 05/03/14 0853 Last data filed at 05/02/14 2249  Gross per 24 hour  Intake    480 ml  Output    600 ml  Net   -120 ml   Weight change: -2 kg (-4 lb 6.6 oz) Exam:   General:  Pt is alert, follows commands appropriately, not in acute distress  HEENT: No icterus, No thrush, No neck mass, [place]/AT  Cardiovascular: RRR, S1/S2, no rubs, no gallops  Respiratory: Bilateral crackles. No wheeze.  Abdomen: Soft/+BS, non tender,  non distended, no  guarding  Extremities: 2+ LE edema, No lymphangitis, No petechiae, No rashes, no synovitis  Data Reviewed: Basic Metabolic Panel:  Recent Labs Lab 04/29/14 1037 May 13, 2014 0959 05-13-2014 1459 05/02/14 0440 05/03/14 0454  NA 135 134*  --  137 136  K 4.4 4.6  --  4.4 4.1  CL 96 94*  --  96 95*  CO2 35* 24  --  29 27  GLUCOSE 110* 158*  --  139* 143*  BUN 44* 53*  --  61* 79*  CREATININE 1.82* 1.89*  --  2.02* 2.35*  CALCIUM 9.9 9.6  --  9.5 9.5  MG  --   --  2.6*  --   --    Liver Function Tests:  Recent Labs Lab May 13, 2014 0959  AST 84*  ALT 57*  ALKPHOS 124*  BILITOT 3.7*  PROT 8.0  ALBUMIN 3.7   No results for input(s): LIPASE, AMYLASE in the last 168 hours. No results for input(s): AMMONIA in the last 168 hours. CBC:  Recent Labs Lab May 13, 2014 0959 05/03/14 0454  WBC 8.6 8.4  NEUTROABS 7.0  --   HGB 11.9* 11.1*  HCT 36.5* 33.9*  MCV 105.8* 106.3*  PLT 113* 82*   Cardiac Enzymes:  Recent Labs Lab 2014-05-13 1459 May 13, 2014 1830 05/02/14 0110 05/02/14 0440  TROPONINI 0.12* 0.12* 0.15* 0.16*   BNP: Invalid input(s): POCBNP CBG: No results for input(s): GLUCAP in the last 168 hours.  Recent Results (from the past 240 hour(s))  Urine culture     Status: None   Collection Time: 2014/05/13  4:24 PM  Result Value Ref Range Status   Specimen Description URINE, RANDOM  Final   Special Requests NONE  Final   Colony Count   Final    70,000 COLONIES/ML Performed at [facility]    Culture   Final    Multiple bacterial morphotypes present, none predominant. Suggest appropriate recollection if clinically indicated. Performed at [facility]    Report Status 05/03/2014 FINAL  Final     Scheduled Meds: . acetaminophen  1,000 mg Oral q morning - 10a  . apixaban  2.5 mg Oral BID  . carvedilol  6.25 mg Oral BID WC  . digoxin  62.5 mcg Oral Daily  . multivitamin with minerals  1 tablet Oral Daily  . senna-docusate  1 tablet Oral QHS  . sodium  chloride  1 spray Each Nare Daily  . sodium chloride  3 mL Intravenous Q12H   Continuous Infusions:    [name] Erney, DO  [facility] Pager [phone]  If 7PM-7AM, please contact night-coverage [email or web address] Password [email or web address] 05/03/2014, 8:53 AM   LOS: 2 days

## 2014-05-04 DIAGNOSIS — I509 Heart failure, unspecified: Secondary | ICD-10-CM | POA: Diagnosis present

## 2014-05-04 DIAGNOSIS — Z9229 Personal history of other drug therapy: Secondary | ICD-10-CM

## 2014-05-04 DIAGNOSIS — I350 Nonrheumatic aortic (valve) stenosis: Secondary | ICD-10-CM

## 2014-05-04 DIAGNOSIS — Z4502 Encounter for adjustment and management of automatic implantable cardiac defibrillator: Secondary | ICD-10-CM

## 2014-05-04 DIAGNOSIS — R531 Weakness: Secondary | ICD-10-CM

## 2014-05-04 DIAGNOSIS — Z95810 Presence of automatic (implantable) cardiac defibrillator: Secondary | ICD-10-CM | POA: Diagnosis present

## 2014-05-04 LAB — BASIC METABOLIC PANEL
ANION GAP: 12 (ref 5–15)
BUN: 83 mg/dL — ABNORMAL HIGH (ref 6–23)
CALCIUM: 9.5 mg/dL (ref 8.4–10.5)
CHLORIDE: 95 mmol/L — AB (ref 96–112)
CO2: 26 mmol/L (ref 19–32)
Creatinine, Ser: 2.42 mg/dL — ABNORMAL HIGH (ref 0.50–1.35)
GFR calc Af Amer: 26 mL/min — ABNORMAL LOW (ref >90)
GFR calc non Af Amer: 23 mL/min — ABNORMAL LOW (ref >90)
Glucose, Bld: 145 mg/dL — ABNORMAL HIGH (ref 70–99)
POTASSIUM: 4.5 mmol/L (ref 3.5–5.1)
SODIUM: 133 mmol/L — AB (ref 135–145)

## 2014-05-04 MED ORDER — FUROSEMIDE 10 MG/ML IJ SOLN
80.0000 mg | Freq: Three times a day (TID) | INTRAMUSCULAR | Status: DC
  Administered 2014-05-04 – 2014-05-05 (×3): 80 mg via INTRAVENOUS
  Filled 2014-05-04 (×5): qty 8

## 2014-05-04 MED ORDER — ACETAMINOPHEN 325 MG PO TABS
650.0000 mg | ORAL_TABLET | Freq: Every morning | ORAL | Status: DC
  Administered 2014-05-05 – 2014-05-06 (×2): 650 mg via ORAL
  Filled 2014-05-04 (×2): qty 2

## 2014-05-04 MED ORDER — LORAZEPAM 0.5 MG PO TABS
0.5000 mg | ORAL_TABLET | Freq: Three times a day (TID) | ORAL | Status: DC | PRN
  Administered 2014-05-04: 0.5 mg via ORAL
  Filled 2014-05-04: qty 1

## 2014-05-04 MED ORDER — DOBUTAMINE IN D5W 4-5 MG/ML-% IV SOLN
5.0000 ug/kg/min | INTRAVENOUS | Status: DC
  Administered 2014-05-04: 5 ug/kg/min via INTRAVENOUS
  Filled 2014-05-04 (×2): qty 250

## 2014-05-04 MED ORDER — ACETAMINOPHEN 325 MG PO TABS
650.0000 mg | ORAL_TABLET | Freq: Four times a day (QID) | ORAL | Status: DC | PRN
Start: 2014-05-04 — End: 2014-05-09

## 2014-05-04 NOTE — Progress Notes (Addendum)
SUBJECTIVE: Unfortunately we are not making any progress. The patient is very weak. The input and output data appears incomplete. His urine is concentrated this morning. Because of worsening renal function, diuretics were put on hold yesterday. His atrial fib persists. The rate is controlled. Tikosyn was stopped yesterday as it was not helping him.   Filed Vitals:   05/04/14 0400 05/04/14 0730 05/04/14 0945 05/04/14 1210  BP: 103/66 115/67    Pulse: 87 81 85   Temp: 98 F (36.7 C) 96 F (35.6 C)  96.2 F (35.7 C)  TempSrc: Oral Axillary  Axillary  Resp: 35 19    Height:      Weight: 169 lb 8.5 oz (76.9 kg)     SpO2: 99% 99%       Intake/Output Summary (Last 24 hours) at 05/04/14 1234 Last data filed at 05/04/14 1213  Gross per 24 hour  Intake      3 ml  Output    600 ml  Net   -597 ml    LABS: Basic Metabolic Panel:  Recent Labs  16/10/96 1459  05/03/14 0454 05/04/14 0315  NA  --   < > 136 133*  K  --   < > 4.1 4.5  CL  --   < > 95* 95*  CO2  --   < > 27 26  GLUCOSE  --   < > 143* 145*  BUN  --   < > 79* 83*  CREATININE  --   < > 2.35* 2.42*  CALCIUM  --   < > 9.5 9.5  MG 2.6*  --   --   --   < > = values in this interval not displayed. Liver Function Tests: No results for input(s): AST, ALT, ALKPHOS, BILITOT, PROT, ALBUMIN in the last 72 hours. No results for input(s): LIPASE, AMYLASE in the last 72 hours. CBC:  Recent Labs  05/03/14 0454  WBC 8.4  HGB 11.1*  HCT 33.9*  MCV 106.3*  PLT 82*   Cardiac Enzymes:  Recent Labs  04/18/2014 1830 05/02/14 0110 05/02/14 0440  TROPONINI 0.12* 0.15* 0.16*   BNP: Invalid input(s): POCBNP D-Dimer: No results for input(s): DDIMER in the last 72 hours. Hemoglobin A1C: No results for input(s): HGBA1C in the last 72 hours. Fasting Lipid Panel: No results for input(s): CHOL, HDL, LDLCALC, TRIG, CHOLHDL, LDLDIRECT in the last 72 hours. Thyroid Function Tests:  Recent Labs  05/07/2014 1537  TSH 0.711     RADIOLOGY: Dg Chest 2 View  05/03/2014   CLINICAL DATA:  Weakness shortness of breath congestion for 3 days, multiple falls in the past 2 weeks  EXAM: CHEST  2 VIEW  COMPARISON:  04/26/2014  FINDINGS: Stable moderate cardiac silhouette enlargement. Moderate vascular congestion. Moderate interstitial and alveolar edema. Unchanged small to moderate left effusion with left lower lobe consolidation.  IMPRESSION: Congestive heart failure with mildly increased pulmonary edema.   Electronically Signed   By: Esperanza Heir M.D.   On: 05/03/2014 10:30   Dg Chest 2 View  04/27/2014   CLINICAL DATA:  Initial evaluation for weakness, shortness of breath, history of atrial fibrillation  EXAM: CHEST  2 VIEW  COMPARISON:  12/09/2013  FINDINGS: Moderate enlargement of cardiac silhouette. Moderate vascular congestion. Peribronchial wall thickening and mild interstitial prominence with mild hazy density right perihilar area. Chronic pleural parenchymal thickening left lung base. Cardiac defibrillator again noted.  IMPRESSION: Congestive heart failure with mild interstitial pulmonary edema  Electronically Signed   By: Esperanza Heiraymond  Rubner M.D.   On: 05/14/2014 10:43   Ct Head Wo Contrast  04/14/2014   CLINICAL DATA:  Status post fall; hit right superior orbital rim. Patient on blood thinners. Initial encounter.  EXAM: CT HEAD WITHOUT CONTRAST  TECHNIQUE: Contiguous axial images were obtained from the base of the skull through the vertex without intravenous contrast.  COMPARISON:  CT of the head performed 01/07/2005, and CTA of the head performed 04/10/2007  FINDINGS: There is no evidence of acute infarction, mass lesion, or intra- or extra-axial hemorrhage on CT.  Prominence of the ventricles and sulci reflects mild cortical volume loss. Cerebellar atrophy is noted. Mild periventricular and subcortical white matter change likely reflects small vessel ischemic microangiopathy.  The brainstem and fourth ventricle are within  normal limits. The basal ganglia are unremarkable in appearance. The cerebral hemispheres demonstrate grossly normal gray-white differentiation. No mass effect or midline shift is seen.  There is no evidence of fracture; visualized osseous structures are unremarkable in appearance. The visualized portions of the orbits are within normal limits. The paranasal sinuses and mastoid air cells are well-aerated. Soft tissue swelling is noted overlying the right frontal calvarium, just superior to the right orbit.  IMPRESSION: 1. No evidence of traumatic intracranial injury or fracture. 2. Mild cortical volume loss and scattered small vessel ischemic microangiopathy. 3. Soft tissue swelling overlying the right frontal calvarium, just superior to the right orbit.   Electronically Signed   By: Roanna RaiderJeffery  Chang M.D.   On: 04/14/2014 22:22   [place] Abdomen Complete  05/02/2014   CLINICAL DATA:  Transaminasemia, hyperbilirubinemia, acute on chronic renal failure  EXAM: ULTRASOUND ABDOMEN COMPLETE  COMPARISON:  CT 01/12/2009  FINDINGS: Gallbladder: There are multiple calculi in the gallbladder lumen measuring up to 11 mm. There is no gallbladder wall thickening or pericholecystic fluid. The patient was not tender over the gallbladder.  Common bile duct: Diameter: 3.6 mm, normal  Liver: There is increased echogenicity of hepatic parenchyma consistent with fatty infiltration. No focal liver lesions are evident.  IVC: No abnormality visualized.  Pancreas: Not visualized  Spleen: Size and appearance within normal limits.  Right Kidney: Length: 10.7 cm. Echogenicity within normal limits. No mass or hydronephrosis visualized.  Left Kidney: Length: 13.1 cm. Echogenicity within normal limits. No mass or hydronephrosis visualized.  Abdominal aorta: Not visible  Other findings: Bilateral pleural effusions  IMPRESSION: 1. Cholelithiasis without evidence of cholecystitis 2. Dense liver, likely fatty infiltration 3. Nonvisualization of the  pancreas and aorta 4. Unremarkable kidneys, negative for hydronephrosis 5. Bilateral pleural effusions   Electronically Signed   By: Ellery Plunkaniel R Mitchell M.D.   On: 05/02/2014 06:05    PHYSICAL EXAM patient is very weak. There is a small hematoma above his right eye. It is stable. Lungs reveal scattered rhonchi. Cardiac exam reveals S1 and S2. Abdomen is soft. He has 1-2+ peripheral edema.   TELEMETRY:   I have reviewed telemetry today May 04, 2014. He is in atrial fibrillation. The rate is controlled.   ASSESSMENT AND PLAN:    Acute on chronic systolic CHF (congestive heart failure)    Very difficult overall situation. Holding his diuretic yesterday did not appear to positively affect his renal function. He has edema. It appears that he needs an inotropic agent. I will last the heart failure team to see him formally tomorrow. For today I have decided to put him back on diuretics. I will also add dobutamine. We will see if  this helps on the short-term. It is important remember that he also has some aortic stenosis.    Weakness    Acute on chronic renal failure     He has a component of cardiorenal syndrome. For today are chosen to give him IV diuretics.    Persistent atrial fibrillation     His rate is controlled. Tikosyn was stopped. There has been discussion with Dr. already as to whether or not amiodarone can be used. The patient had been hasn't in about this. I will not start this at this time.    Thrombocytopenia   Acute renal failure superimposed on stage 3 chronic kidney disease    Aortic stenosis     It does not seem that he would be a candidate for an interventional procedure concerning his aortic stenosis.    HX: anticoagulation    The patient is anticoagulated. He is very weak and has falls. This issue will have to be reconsidered.    ICD (implantable cardioverter-defibrillator) in place   Willa Rough 05/04/2014 12:34 PM

## 2014-05-04 NOTE — Progress Notes (Signed)
[facility]  Patient Status: Active (receiving services up to time of hospitalization)  [facility] is providing the following services: RN and PT  If patient discharges after hours, please call [phone].   Sherryll BurgerStephanie M George 05/04/2014, 12:12 PM

## 2014-05-04 NOTE — Progress Notes (Signed)
[place] TEAM 1 - Stepdown/ICU TEAM Progress Note  DMARI SCHUBRING QMV:[id number] DOB: 1926/03/22 DOA: 04/25/2014 PCP: Sonda Primes, MD  Admit HPI / Brief Narrative: 81 year old male with a history of persistent atrial fibrillation, ischemic cardiomyopathy, and CKD stage III who presented with a 3 day history of dyspnea on exertion and worsening generalized weakness. The patient lives by himself, but has a caretaker that helps him with his medications. He had been using a walker to ambulate, but in the prior 2 weeks he had at least 2 falls. The patient was taking torsemide 60 mg daily prior to admission which was a new dose as of 04/29/2014. The patient was admitted for acute on chronic systolic CHF with EF of 20-25%. Unfortunately, the patient likely has cardiorenal syndrome with worsening renal function with diuresis. Cardiology was consulted and recommended transfer to [facility] for CHF team to assist in management.  HPI/Subjective: Pt is somnolent, but able to interact some.  He is mildly confused.  He reports intermittent "panic attacks" feeling like he is smothering and can't breath fast enough.  He denies cp, abdom pain, n/v, or focal neuro deficits.    Assessment/Plan:  Acute on chronic systolic CHF - Ischemic cardiomyopathy  -weight 83kg on 04/09/2014 - currently ~80kg -12/12/2013 echocardiogram EF 20-25%, LA thrombus > 05/02/14--repeat Echo--EF 20-25%, severe AS, diffuse HK -s/p AICD  Acute on chronic renal failure (CKD stage III) -Baseline creatinine 1.5-1.8 -Difficult situation as the patient has an EF 20% and likely has cardiorenal syndrome - diuretic currently on hold  -Hold losartan -Abdominal ultrasound negative hydronephrosis, noting cholelithiasis without cholecystitis, fatty liver  Severe AoS Mod AoS noted on TTE dating back to Oct 2009, and mild AoS noted on TTE 2006 - Cardiology following - family stated they were not aware pt had AoS   Mildly Elevated troponin -Doubt  ACS -Likely elevated in the setting of worsening renal failure and decompensated CHF w/ afib  -appreciate Cardiology assistance   Persistent atrial fibrillation  -Rate controlled  -Med management per Cardiology  -Continue apixiban--difficult situation as the patient has a left atrial thrombus, recent history of falls--now with hematoma and lac R-periorbital area--stable  Transaminitis  -Likely due to chronic hepatic congestion from the patient's ischemic cardiomyopathy -Abdominal ultrasound > cholelithiasis without cholecystitis, fatty liver, no hydronephrosis -follow trend - check ammonia   Thrombocytopenia -Suspect this is due to chronic hepatic congestion - is a chronic issue  -Monitor for signs of bleeding -serum B12 858 -Abdominal ultrasound--no splenomegaly  Generalized weakness -Multifactorial including decompensated CHF, deconditioning, renal failure - B12 and TSH normal   Pyuria -culture unrevealing  -pt asymptomatic without fever or leukocytosis -likely contaminant and pt is asymptomatic--will NOT start abx at this time  Code Status: FULL Family Communication: spoke a length w/ son and daughter at bedside  Disposition Plan: SDU  Consultants: Cardiology   Procedures: none  Antibiotics: none  DVT prophylaxis: apixaban  Objective: Blood pressure 115/67, pulse 85, temperature 96 F (35.6 C), temperature source Axillary, resp. rate 19, height  (1.854 m), weight 76.9 kg (169 lb 8.5 oz), SpO2 99 %.  Intake/Output Summary (Last 24 hours) at 05/04/14 1012 Last data filed at 05/04/14 0949  Gross per 24 hour  Intake      3 ml  Output    500 ml  Net   -497 ml   Exam: General: mild resp distress - maintaining sats but intermittently tachypneic  Lungs: fine crackles greatest in bases - no wheeze  Cardiovascular:  Regular rate - distant HS - no appreciable M  Abdomen: Nontender, nondistended, soft, bowel sounds positive, no rebound, no ascites, no  appreciable mass Extremities: No significant cyanosis, clubbing;  1+ edema bilateral lower extremities  Data Reviewed: Basic Metabolic Panel:  Recent Labs Lab 04/29/14 1037 05-26-14 0959 2014/05/26 1459 05/02/14 0440 05/03/14 0454 05/04/14 0315  NA 135 134*  --  137 136 133*  K 4.4 4.6  --  4.4 4.1 4.5  CL 96 94*  --  96 95* 95*  CO2 35* 24  --  GLUCOSE 110* 158*  --  139* 143* 145*  BUN 44* 53*  --  61* 79* 83*  CREATININE 1.82* 1.89*  --  2.02* 2.35* 2.42*  CALCIUM 9.9 9.6  --  9.5 9.5 9.5  MG  --   --  2.6*  --   --   --     Liver Function Tests:  Recent Labs Lab May 26, 2014 0959  AST 84*  ALT 57*  ALKPHOS 124*  BILITOT 3.7*  PROT 8.0  ALBUMIN 3.7    CBC:  Recent Labs Lab 05/26/2014 0959 05/03/14 0454  WBC 8.6 8.4  NEUTROABS 7.0  --   HGB 11.9* 11.1*  HCT 36.5* 33.9*  MCV 105.8* 106.3*  PLT 113* 82*    Cardiac Enzymes:  Recent Labs Lab 05-26-2014 1459 05/26/2014 1830 05/02/14 0110 05/02/14 0440  TROPONINI 0.12* 0.12* 0.15* 0.16*   BNP (last 3 results)  Recent Labs  2014-05-26 1459 05/03/14 0459  BNP 3623.9* 2081.1*    Recent Results (from the past 240 hour(s))  Urine culture     Status: None   Collection Time: 05/26/2014  4:24 PM  Result Value Ref Range Status   Specimen Description URINE, RANDOM  Final   Special Requests NONE  Final   Colony Count   Final    70,000 COLONIES/ML Performed at [facility]    Culture   Final    Multiple bacterial morphotypes present, none predominant. Suggest appropriate recollection if clinically indicated. Performed at [facility]    Report Status 05/03/2014 FINAL  Final  MRSA PCR Screening     Status: None   Collection Time: 05/03/14  5:31 PM  Result Value Ref Range Status   MRSA by PCR NEGATIVE NEGATIVE Final    Comment:        The GeneXpert MRSA Assay (FDA approved for NASAL specimens only), is one component of a comprehensive MRSA colonization surveillance program. It is  not intended to diagnose MRSA infection nor to guide or monitor treatment for MRSA infections.      Studies:  Recent x-ray studies have been reviewed in detail by the Attending Physician  Scheduled Meds:  Scheduled Meds: . acetaminophen  1,000 mg Oral q morning - 10a  . apixaban  2.5 mg Oral BID  . carvedilol  6.25 mg Oral BID WC  . digoxin  62.5 mcg Oral Daily  . multivitamin with minerals  1 tablet Oral Daily  . senna-docusate  1 tablet Oral QHS  . sodium chloride  1 spray Each Nare Daily  . sodium chloride  3 mL Intravenous Q12H    Time spent on care of this patient: 35 mins   MCCLUNG,JEFFREY T , MD   [facility] Office  [phone] Pager - Text Page per Loretha Stapler as per below:  On-Call/Text Page:      [email or web address]      password [email or web address]  If 7PM-7AM, please contact night-coverage  [email or web address] Password [email or web address] 05/04/2014, 10:12 AM   LOS: 3 days

## 2014-05-04 NOTE — Discharge Instructions (Signed)

## 2014-05-05 ENCOUNTER — Encounter: Payer: Medicare Other | Admitting: Internal Medicine

## 2014-05-05 ENCOUNTER — Inpatient Hospital Stay (HOSPITAL_COMMUNITY): Payer: Medicare Other

## 2014-05-05 DIAGNOSIS — E43 Unspecified severe protein-calorie malnutrition: Secondary | ICD-10-CM | POA: Diagnosis present

## 2014-05-05 LAB — COMPREHENSIVE METABOLIC PANEL
ALT: 475 U/L — ABNORMAL HIGH (ref 0–53)
ANION GAP: 13 (ref 5–15)
AST: 417 U/L — ABNORMAL HIGH (ref 0–37)
Albumin: 2.9 g/dL — ABNORMAL LOW (ref 3.5–5.2)
Alkaline Phosphatase: 104 U/L (ref 39–117)
BILIRUBIN TOTAL: 2.8 mg/dL — AB (ref 0.3–1.2)
BUN: 94 mg/dL — ABNORMAL HIGH (ref 6–23)
CHLORIDE: 97 mmol/L (ref 96–112)
CO2: 26 mmol/L (ref 19–32)
CREATININE: 2.58 mg/dL — AB (ref 0.50–1.35)
Calcium: 9.2 mg/dL (ref 8.4–10.5)
GFR, EST AFRICAN AMERICAN: 24 mL/min — AB (ref >90)
GFR, EST NON AFRICAN AMERICAN: 21 mL/min — AB (ref >90)
GLUCOSE: 141 mg/dL — AB (ref 70–99)
Potassium: 4.2 mmol/L (ref 3.5–5.1)
Sodium: 136 mmol/L (ref 135–145)
Total Protein: 6.8 g/dL (ref 6.0–8.3)

## 2014-05-05 LAB — AMMONIA: Ammonia: 28 umol/L (ref 11–32)

## 2014-05-05 LAB — CARBOXYHEMOGLOBIN
CARBOXYHEMOGLOBIN: 1.2 % (ref 0.5–1.5)
METHEMOGLOBIN: 0.9 % (ref 0.0–1.5)
O2 Saturation: 50 %
Total hemoglobin: 10.8 g/dL — ABNORMAL LOW (ref 13.5–18.0)

## 2014-05-05 MED ORDER — SODIUM CHLORIDE 0.9 % IJ SOLN
10.0000 mL | Freq: Two times a day (BID) | INTRAMUSCULAR | Status: DC
  Administered 2014-05-05 – 2014-05-07 (×4): 10 mL

## 2014-05-05 MED ORDER — AMIODARONE HCL IN DEXTROSE 360-4.14 MG/200ML-% IV SOLN
30.0000 mg/h | INTRAVENOUS | Status: DC
  Administered 2014-05-06 – 2014-05-07 (×4): 30 mg/h via INTRAVENOUS
  Filled 2014-05-05 (×7): qty 200

## 2014-05-05 MED ORDER — CETYLPYRIDINIUM CHLORIDE 0.05 % MT LIQD
7.0000 mL | Freq: Two times a day (BID) | OROMUCOSAL | Status: DC
  Administered 2014-05-05 – 2014-05-07 (×5): 7 mL via OROMUCOSAL

## 2014-05-05 MED ORDER — CARVEDILOL 3.125 MG PO TABS
3.1250 mg | ORAL_TABLET | Freq: Two times a day (BID) | ORAL | Status: DC
  Administered 2014-05-05 – 2014-05-06 (×3): 3.125 mg via ORAL
  Filled 2014-05-05 (×6): qty 1

## 2014-05-05 MED ORDER — AMIODARONE IV BOLUS ONLY 150 MG/100ML
150.0000 mg | Freq: Once | INTRAVENOUS | Status: AC
  Administered 2014-05-06: 150 mg via INTRAVENOUS
  Filled 2014-05-05: qty 100

## 2014-05-05 MED ORDER — FUROSEMIDE 10 MG/ML IJ SOLN
15.0000 mg/h | INTRAVENOUS | Status: DC
  Administered 2014-05-05: 10 mg/h via INTRAVENOUS
  Administered 2014-05-06 – 2014-05-07 (×2): 15 mg/h via INTRAVENOUS
  Filled 2014-05-05 (×5): qty 25

## 2014-05-05 MED ORDER — AMIODARONE HCL IN DEXTROSE 360-4.14 MG/200ML-% IV SOLN
60.0000 mg/h | INTRAVENOUS | Status: AC
  Administered 2014-05-06: 60 mg/h via INTRAVENOUS
  Filled 2014-05-05 (×2): qty 200

## 2014-05-05 MED ORDER — SODIUM CHLORIDE 0.9 % IJ SOLN: 10.0000 mL | INTRAMUSCULAR | Status: DC | PRN

## 2014-05-05 MED ORDER — MILRINONE IN DEXTROSE 20 MG/100ML IV SOLN
0.3750 ug/kg/min | INTRAVENOUS | Status: DC
  Administered 2014-05-05 (×2): 0.25 ug/kg/min via INTRAVENOUS
  Administered 2014-05-06 – 2014-05-07 (×3): 0.375 ug/kg/min via INTRAVENOUS
  Filled 2014-05-05 (×5): qty 100

## 2014-05-05 MED ORDER — AMIODARONE LOAD VIA INFUSION
150.0000 mg | Freq: Once | INTRAVENOUS | Status: DC
  Filled 2014-05-05: qty 83.34

## 2014-05-05 MED ORDER — AMIODARONE LOAD VIA INFUSION
150.0000 mg | Freq: Once | INTRAVENOUS | Status: AC | PRN
  Filled 2014-05-05: qty 83.34

## 2014-05-05 NOTE — Progress Notes (Signed)
INITIAL NUTRITION ASSESSMENT  DOCUMENTATION CODES Per approved criteria  -Severe malnutrition in the context of chronic illness   INTERVENTION:  Ensure Enlive PO TID, each supplement provides 350 kcal and 20 grams of protein  NUTRITION DIAGNOSIS: Malnutrition related to inadequate oral intake as evidenced by 7% weight loss in one month and severe depletion of muscle mass.   Goal: Intake to meet >90% of estimated nutrition needs.  Monitor:  PO intake, labs, weight trend.  Reason for Assessment: Low Braden  81 y.o. male  Admitting Dx: Generalized weakness, dyspnea on exertion  ASSESSMENT: Patient presented to the hospital on 3/17 with 3 days history of dyspnea on exertion and worsening generalized weakness.  Patient's son reports that patient has had a lot of weight loss over the past few months. He has been eating poorly for a while, but intake has improved a little over the past few days. Only consuming 25% of lunch and dinner meals, does better with breakfast. He is trying to drink plenty of fluids. Agreed to Ensure TID between meals.   Nutrition Focused Physical Exam:  Subcutaneous Fat:  Orbital Region: moderate depletion Upper Arm Region: mild depletion Thoracic and Lumbar Region: N/A  Muscle:  Temple Region: moderate depletion Clavicle Bone Region: severe depletion Clavicle and Acromion Bone Region: severe depletion Scapular Bone Region: N/A Dorsal Hand: moderate depletion Patellar Region: moderate depletion Anterior Thigh Region: moderate depletion Posterior Calf Region: severe depletion  Edema: none   Height: Ht Readings from Last 1 Encounters:  05/03/14  (1.854 m)    Weight: Wt Readings from Last 1 Encounters:  05/05/14 169 lb 15.6 oz (77.1 kg)    Ideal Body Weight: 83.6 kg  % Ideal Body Weight: 92%  Wt Readings from Last 10 Encounters:  05/05/14 169 lb 15.6 oz (77.1 kg)  04/29/14 174 lb (78.926 kg)  04/15/14 178 lb 12 oz (81.08 kg)   04/09/14 182 lb 3.2 oz (82.645 kg)  03/21/14 184 lb (83.462 kg)  03/13/14 187 lb (84.823 kg)  02/28/14 182 lb 12.8 oz (82.918 kg)  02/25/14 182 lb (82.555 kg)  02/11/14 181 lb (82.101 kg)  02/04/14 182 lb (82.555 kg)    Usual Body Weight: 182 lbs  % Usual Body Weight: 93%  BMI:  Body mass index is 22.43 kg/(m^2).  Estimated Nutritional Needs: Kcal: 1950-2150 Protein: 100-115 gm Fluid: 1.8-2 L  Skin: no issues  Diet Order: Diet Heart  EDUCATION NEEDS: -Education needs addressed   Intake/Output Summary (Last 24 hours) at 05/05/14 1016 Last data filed at 05/05/14 0700  Gross per 24 hour  Intake  84.87 ml  Output    550 ml  Net -465.13 ml    Last BM: 3/18   Labs:   Recent Labs Lab 04/16/2014 1459  05/03/14 0454 05/04/14 0315 05/05/14 0345  NA  --   < > 136 133* 136  K  --   < > 4.1 4.5 4.2  CL  --   < > 95* 95* 97  CO2  --   < > BUN  --   < > 79* 83* 94*  CREATININE  --   < > 2.35* 2.42* 2.58*  CALCIUM  --   < > 9.5 9.5 9.2  MG 2.6*  --   --   --   --   GLUCOSE  --   < > 143* 145* 141*  < > = values in this interval not displayed.  CBG (last 3)  No results for input(s): GLUCAP in the last 72 hours.  Scheduled Meds: . acetaminophen  650 mg Oral q morning - 10a  . apixaban  2.5 mg Oral BID  . carvedilol  3.125 mg Oral BID WC  . multivitamin with minerals  1 tablet Oral Daily  . senna-docusate  1 tablet Oral QHS  . sodium chloride  1 spray Each Nare Daily    Continuous Infusions: . furosemide (LASIX) infusion    . milrinone      Past Medical History  Diagnosis Date  . Atrial fibrillation     AFIB  . Ischemic cardiomyopathy     CABG in 1991; last catheter 2005 with patent vein graft to PD, diagonal, marginal/echo 2010 EF 25%.  . 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 Abdominal aneurysm without mention of rupture     s/p stent graft with endoleak requiring restenting-[facility]  . Chronic  systolic heart failure     SYSTOLIC .Marland Kitchen.ACUTE ON CHRONIC  . Hypertension   . Peripheral vascular disease, unspecified   . Hemoptysis   . Acute prostatitis   . Labyrinthitis, unspecified   . Unspecified hearing loss   . Cervicalgia   . Stroke   . CKD (chronic kidney disease), stage III   . Chronic anticoagulation     Coumadin    Past Surgical History  Procedure Laterality Date  . Insert / replace / remove pacemaker  2006    AICD GUIDANT VITALITY..IN SITU  . Colectomy  07/15/96  . Colostomy  07/15/96    COLOSTOMY TAKEN DOWN 01/14/97  . Coronary artery bypass graft  03/22/89  . Cardiac catheterization  2001    ANGIOPLASTY W/STENT RCA  . Abdominal aortic aneurysm repair  05/1999    STENT, ENDOVASCULAR RELINING OF GRAFT 3/11  . Cardioversion    . Tee without cardioversion N/A 12/12/2013    Procedure: TRANSESOPHAGEAL ECHOCARDIOGRAM (TEE);  Surgeon: [name], MD;  [facility];  Service: Cardiovascular;  Laterality: N/A;  . Cardioversion N/A 01/06/2014    Procedure: CARDIOVERSION;  Surgeon: Laurey Moralealton S McLean, MD;  [facility];  Service: Cardiovascular;  Laterality: N/A;  . Implantable cardioverter defibrillator (icd) generator change N/A 01/18/2011    Procedure: ICD GENERATOR CHANGE;  Surgeon: Duke SalviaSteven C Klein, MD;  [facility];  Service: Cardiovascular;  Laterality: N/A;  . Cardioversion N/A 02/10/2014    Procedure: CARDIOVERSION;  Surgeon: Quintella Reichertraci R Turner, MD;  [facility];  Service: Cardiovascular;  Laterality: N/A;    Joaquin CourtsKimberly Epimenio Schetter, RD, LDN, CNSC Pager [phone] After Hours Pager [phone]

## 2014-05-05 NOTE — Significant Event (Addendum)
Paged re episode of VT.  On tele review he had nearly 90 seconds of MMVT at rate of 142bpm. Patient asymptomatic. No ICD therapy. Suspect related to both inotropes and low output state. Will plan to check BMP/Mg (goal K=4, Mg=2) and start amiodarone -- hopefully this will allow him to tolerate inotrope assisted diuresis.

## 2014-05-05 NOTE — Progress Notes (Signed)
16 fr foley placed per MD order/protocol d/t strict I&O. Pt tolerated procedure well Raymon MuttonWhitney Davis RN present as second person during insertion. Peri care care with soap and water provided prior to insertion and sterile procedure maintained throughout. Will continue to monitor.

## 2014-05-05 NOTE — Progress Notes (Signed)
  [place] TEAM 1 - Stepdown/ICU TEAM  Discussed w/ Dr. Shirlee LatchMcLean, who has agreed to assume attending role for this patient, as all of his active issues are currently cardiac in nature.    Please call [facility] if there is any way we can assist in his care in the future.  Lonia BloodJeffrey T. Keneisha Heckart, MD [facility] For Consults/Admissions - Flow Manager [phone]- [phone] Office  [phone]  Contact MD directly via text page:      [email or web address]      password [email or web address]

## 2014-05-05 NOTE — Progress Notes (Signed)
Patient ID: [name], male   DOB: [birth year], 81 y.o.   MRN: [id number]   SUBJECTIVE: Dobutamine started yesterday and getting Lasix 80 mg IV every 8 hours.  UOP was not vigorous.  Weight stable.  BUN/creatinine continues to rise.  Patient weak, dyspeic with any exertion.   Scheduled Meds: . acetaminophen  650 mg Oral q morning - 10a  . apixaban  2.5 mg Oral BID  . carvedilol  3.125 mg Oral BID WC  . multivitamin with minerals  1 tablet Oral Daily  . senna-docusate  1 tablet Oral QHS  . sodium chloride  1 spray Each Nare Daily   Continuous Infusions: . furosemide (LASIX) infusion    . milrinone     PRN Meds:.acetaminophen, LORazepam, ondansetron (ZOFRAN) IV, polyvinyl alcohol    Filed Vitals:   05/05/14 0100 05/05/14 0200 05/05/14 0420 05/05/14 0700  BP: 115/76 110/52 119/62   Pulse: 79 77 79   Temp:   97.4 F (36.3 C) 97.5 F (36.4 C)  TempSrc:   Axillary Axillary  Resp: Height:      Weight:   169 lb 15.6 oz (77.1 kg)   SpO2: 100% 100% 99%     Intake/Output Summary (Last 24 hours) at 05/05/14 0930 Last data filed at 05/05/14 0700  Gross per 24 hour  Intake  87.87 ml  Output    550 ml  Net -462.13 ml    LABS: Basic Metabolic Panel:  Recent Labs  40/98/11 0315 05/05/14 0345  NA 133* 136  K 4.5 4.2  CL 95* 97  CO2 26 26  GLUCOSE 145* 141*  BUN 83* 94*  CREATININE 2.42* 2.58*  CALCIUM 9.5 9.2   Liver Function Tests:  Recent Labs  05/05/14 0345  AST 417*  ALT 475*  ALKPHOS 104  BILITOT 2.8*  PROT 6.8  ALBUMIN 2.9*   No results for input(s): LIPASE, AMYLASE in the last 72 hours. CBC:  Recent Labs  05/03/14 0454  WBC 8.4  HGB 11.1*  HCT 33.9*  MCV 106.3*  PLT 82*   Cardiac Enzymes: No results for input(s): CKTOTAL, CKMB, CKMBINDEX, TROPONINI in the last 72 hours. BNP: Invalid input(s): POCBNP D-Dimer: No results for input(s): DDIMER in the last 72 hours. Hemoglobin A1C: No results for input(s): HGBA1C in the last 72  hours. Fasting Lipid Panel: No results for input(s): CHOL, HDL, LDLCALC, TRIG, CHOLHDL, LDLDIRECT in the last 72 hours. Thyroid Function Tests: No results for input(s): TSH, T4TOTAL, T3FREE, THYROIDAB in the last 72 hours.  Invalid input(s): FREET3 Anemia Panel: No results for input(s): VITAMINB12, FOLATE, FERRITIN, TIBC, IRON, RETICCTPCT in the last 72 hours.  RADIOLOGY: Dg Chest 2 View  05/03/2014   CLINICAL DATA:  Weakness shortness of breath congestion for 3 days, multiple falls in the past 2 weeks  EXAM: CHEST  2 VIEW  COMPARISON:  05/12/2014  FINDINGS: Stable moderate cardiac silhouette enlargement. Moderate vascular congestion. Moderate interstitial and alveolar edema. Unchanged small to moderate left effusion with left lower lobe consolidation.  IMPRESSION: Congestive heart failure with mildly increased pulmonary edema.   Electronically Signed   By: Esperanza Heir M.D.   On: 05/03/2014 10:30   Dg Chest 2 View  04/21/2014   CLINICAL DATA:  Initial evaluation for weakness, shortness of breath, history of atrial fibrillation  EXAM: CHEST  2 VIEW  COMPARISON:  12/09/2013  FINDINGS: Moderate enlargement of cardiac silhouette. Moderate vascular congestion. Peribronchial wall thickening and mild interstitial prominence with  mild hazy density right perihilar area. Chronic pleural parenchymal thickening left lung base. Cardiac defibrillator again noted.  IMPRESSION: Congestive heart failure with mild interstitial pulmonary edema   Electronically Signed   By: Esperanza Heir M.D.   On: 04/25/2014 10:43   Ct Head Wo Contrast  04/14/2014   CLINICAL DATA:  Status post fall; hit right superior orbital rim. Patient on blood thinners. Initial encounter.  EXAM: CT HEAD WITHOUT CONTRAST  TECHNIQUE: Contiguous axial images were obtained from the base of the skull through the vertex without intravenous contrast.  COMPARISON:  CT of the head performed 01/07/2005, and CTA of the head performed 04/10/2007   FINDINGS: There is no evidence of acute infarction, mass lesion, or intra- or extra-axial hemorrhage on CT.  Prominence of the ventricles and sulci reflects mild cortical volume loss. Cerebellar atrophy is noted. Mild periventricular and subcortical white matter change likely reflects small vessel ischemic microangiopathy.  The brainstem and fourth ventricle are within normal limits. The basal ganglia are unremarkable in appearance. The cerebral hemispheres demonstrate grossly normal gray-white differentiation. No mass effect or midline shift is seen.  There is no evidence of fracture; visualized osseous structures are unremarkable in appearance. The visualized portions of the orbits are within normal limits. The paranasal sinuses and mastoid air cells are well-aerated. Soft tissue swelling is noted overlying the right frontal calvarium, just superior to the right orbit.  IMPRESSION: 1. No evidence of traumatic intracranial injury or fracture. 2. Mild cortical volume loss and scattered small vessel ischemic microangiopathy. 3. Soft tissue swelling overlying the right frontal calvarium, just superior to the right orbit.   Electronically Signed   By: Roanna Raider M.D.   On: 04/14/2014 22:22   US Abdomen Complete  05/02/2014   CLINICAL DATA:  Transaminasemia, hyperbilirubinemia, acute on chronic renal failure  EXAM: ULTRASOUND ABDOMEN COMPLETE  COMPARISON:  CT 01/12/2009  FINDINGS: Gallbladder: There are multiple calculi in the gallbladder lumen measuring up to 11 mm. There is no gallbladder wall thickening or pericholecystic fluid. The patient was not tender over the gallbladder.  Common bile duct: Diameter: 3.6 mm, normal  Liver: There is increased echogenicity of hepatic parenchyma consistent with fatty infiltration. No focal liver lesions are evident.  IVC: No abnormality visualized.  Pancreas: Not visualized  Spleen: Size and appearance within normal limits.  Right Kidney: Length: 10.7 cm. Echogenicity within  normal limits. No mass or hydronephrosis visualized.  Left Kidney: Length: 13.1 cm. Echogenicity within normal limits. No mass or hydronephrosis visualized.  Abdominal aorta: Not visible  Other findings: Bilateral pleural effusions  IMPRESSION: 1. Cholelithiasis without evidence of cholecystitis 2. Dense liver, likely fatty infiltration 3. Nonvisualization of the pancreas and aorta 4. Unremarkable kidneys, negative for hydronephrosis 5. Bilateral pleural effusions   Electronically Signed   By: Ellery Plunk M.D.   On: 05/02/2014 06:05    PHYSICAL EXAM General: NAD, frail Neck: JVP 16 cm, no thyromegaly or thyroid nodule.  Lungs: Clear to auscultation bilaterally with normal respiratory effort. CV: Nondisplaced PMI.  Heart irregular S1/S2, +S3, 2/6 systolic murmur RUSB and apex.  2+ edema 1/2 up lower legs.   Abdomen: Soft, nontender, no hepatosplenomegaly, no distention.  Neurologic: Alert and oriented x 3.  Psych: Normal affect. Extremities: No clubbing or cyanosis.   TELEMETRY: Reviewed telemetry pt in irregular rhythm  ASSESSMENT AND PLAN: 81 yo with history of CAD s/p CABG, ischemic cardiomyopathy EF 20-25% on last echo, persistent atrial fibrillation failed Tikosyn, and CKD presented with weakness,  acute on chronic systolic CHF.  He was thought to have low output.  1. Acute on chronic systolic CHF: Ischemic cardiomyopathy.  Echo (3/16) with EF 20-25%, moderate MR, ?low gradient severe AS. [employer] ICD.  He has diuresed poorly since admission and creatinine has risen.  On 3/20, he was started on dobutamine but UOP improved minimally.  BUN/creatinine up again today. He is incontinent of urine.  He volume overloaded on exam with probable low output and cardiorenal syndrome.  - Stop dobutamine given poor response, will use milrinone 0.25 mcg/kg/min instead.   - Lasix 80 mg IV x 1 then 10 mg/hr infusion. - Decrease Coreg to 3.125 mg bid.  - Hold digoxin for now with rising  creatinine.  - Will place PICC to follow co-ox and CVP.  Will likely need formal RHC eventually.  2. CAD: s/p CABG.  Last Cardiolite in 1/16 showed primarily scar with EF 15%.  No chest pain. On Eliquis so no ASA.  He has not been on a statin.  3. AKI on CKD: Creatinine elevated at baseline, BUN/creatinine have risen steadily this admission.  Suspect cardiorenal syndrome.  I will add milrinone as above to attempt improve cardiac output.  He is very volume overloaded, hopefully diuresis with lowering of renal venous pressure along with increased cardiac output from milrinone will help decrease creatinine.   4. Atrial fibrillation: Now persistent.  He failed Tikosyn so off.  He is on Eliquis 2.5 mg bid.  HR did not increase with dobutamine, follow closely with milrinone.  Can use amiodarone if needed for rate control.  Eventually, can try amiodarone with repeat cardioversion but right now would be unlikely to hold sinus with cardioversion given milrinone use.  5. Aortic stenosis: Concern for possible low gradient severe AS.  When he recovers from current episode, could consider low dose DSE to further assess.  Probably not going to be a good TAVR candidate, though, and would have to have a lot of improvement to consider TAVR evaluation.   45 minutes critical care time.   Marca AnconaDalton Adriana Lina 05/05/2014

## 2014-05-05 NOTE — Progress Notes (Signed)
Medicare Important Message given? YES (If response is "NO", the following Medicare IM given date fields will be blank) Date Medicare IM given:05/05/2014 Medicare IM given by: Jaid Quirion 

## 2014-05-05 NOTE — Progress Notes (Signed)
[facility] Pt transferring via bed with belongings to 2H19.  Also, Called son Jonny RuizJohn of new room number.

## 2014-05-05 NOTE — Progress Notes (Signed)
Peripherally Inserted Central Catheter/Midline Placement  The IV Nurse has discussed with the patient and/or persons authorized to consent for the patient, the purpose of this procedure and the potential benefits and risks involved with this procedure.  The benefits include less needle sticks, lab draws from the catheter and patient may be discharged home with the catheter.  Risks include, but not limited to, infection, bleeding, blood clot (thrombus formation), and puncture of an artery; nerve damage and irregular heat beat.  Alternatives to this procedure were also discussed.  PICC/Midline Placement Documentation  PICC Triple Lumen 05/05/14 PICC Right Basilic 40 cm 0 cm (Active)       Malita Ignasiak M 05/05/2014, 3:13 PM

## 2014-05-05 NOTE — Progress Notes (Signed)
Paged and notified dr Shirlee LatchMcLean regarding bloody urine in foley catheter. Notified for us to hold eliquis tonight and make sure pt not pulling on catheter.

## 2014-05-06 ENCOUNTER — Inpatient Hospital Stay (HOSPITAL_COMMUNITY): Payer: Medicare Other

## 2014-05-06 ENCOUNTER — Telehealth: Payer: Self-pay | Admitting: Cardiology

## 2014-05-06 ENCOUNTER — Encounter: Payer: Medicare Other | Admitting: *Deleted

## 2014-05-06 LAB — CARBOXYHEMOGLOBIN
CARBOXYHEMOGLOBIN: 1.4 % (ref 0.5–1.5)
Carboxyhemoglobin: 1.6 % — ABNORMAL HIGH (ref 0.5–1.5)
METHEMOGLOBIN: 0.9 % (ref 0.0–1.5)
Methemoglobin: 1 % (ref 0.0–1.5)
O2 Saturation: 53.8 %
O2 Saturation: 50.6 %
Total hemoglobin: 10.1 g/dL — ABNORMAL LOW (ref 13.5–18.0)
Total hemoglobin: 11.4 g/dL — ABNORMAL LOW (ref 13.5–18.0)

## 2014-05-06 LAB — GLUCOSE, CAPILLARY
GLUCOSE-CAPILLARY: 162 mg/dL — AB (ref 70–99)
Glucose-Capillary: 157 mg/dL — ABNORMAL HIGH (ref 70–99)
Glucose-Capillary: 143 mg/dL — ABNORMAL HIGH (ref 70–99)

## 2014-05-06 LAB — BASIC METABOLIC PANEL
Anion gap: 12 (ref 5–15)
Anion gap: 12 (ref 5–15)
Anion gap: 8 (ref 5–15)
BUN: 67 mg/dL — ABNORMAL HIGH (ref 6–23)
BUN: 71 mg/dL — AB (ref 6–23)
BUN: 85 mg/dL — ABNORMAL HIGH (ref 6–23)
CALCIUM: 8.7 mg/dL (ref 8.4–10.5)
CALCIUM: 9.2 mg/dL (ref 8.4–10.5)
CHLORIDE: 88 mmol/L — AB (ref 96–112)
CHLORIDE: 92 mmol/L — AB (ref 96–112)
CHLORIDE: 94 mmol/L — AB (ref 96–112)
CO2: 33 mmol/L — AB (ref 19–32)
CO2: 34 mmol/L — ABNORMAL HIGH (ref 19–32)
CO2: 38 mmol/L — ABNORMAL HIGH (ref 19–32)
CREATININE: 2.02 mg/dL — AB (ref 0.50–1.35)
CREATININE: 2.27 mg/dL — AB (ref 0.50–1.35)
Calcium: 8.9 mg/dL (ref 8.4–10.5)
Creatinine, Ser: 2.06 mg/dL — ABNORMAL HIGH (ref 0.50–1.35)
GFR calc Af Amer: 28 mL/min — ABNORMAL LOW (ref >90)
GFR calc non Af Amer: 24 mL/min — ABNORMAL LOW (ref >90)
GFR calc non Af Amer: 28 mL/min — ABNORMAL LOW (ref >90)
GFR, EST AFRICAN AMERICAN: 33 mL/min — AB (ref >90)
GFR, EST AFRICAN AMERICAN: 32 mL/min — AB (ref >90)
GFR, EST NON AFRICAN AMERICAN: 28 mL/min — AB (ref >90)
GLUCOSE: 269 mg/dL — AB (ref 70–99)
Glucose, Bld: 165 mg/dL — ABNORMAL HIGH (ref 70–99)
Glucose, Bld: 167 mg/dL — ABNORMAL HIGH (ref 70–99)
POTASSIUM: 3.2 mmol/L — AB (ref 3.5–5.1)
POTASSIUM: 3.2 mmol/L — AB (ref 3.5–5.1)
Potassium: 3.1 mmol/L — ABNORMAL LOW (ref 3.5–5.1)
SODIUM: 138 mmol/L (ref 135–145)
Sodium: 135 mmol/L (ref 135–145)
Sodium: 138 mmol/L (ref 135–145)

## 2014-05-06 LAB — CBC
HCT: 30.8 % — ABNORMAL LOW (ref 39.0–52.0)
HCT: 33.6 % — ABNORMAL LOW (ref 39.0–52.0)
Hemoglobin: 10.1 g/dL — ABNORMAL LOW (ref 13.0–17.0)
Hemoglobin: 10.9 g/dL — ABNORMAL LOW (ref 13.0–17.0)
MCH: 34.6 pg — ABNORMAL HIGH (ref 26.0–34.0)
MCH: 34.3 pg — AB (ref 26.0–34.0)
MCHC: 32.8 g/dL (ref 30.0–36.0)
MCHC: 32.4 g/dL (ref 30.0–36.0)
MCV: 105.5 fL — ABNORMAL HIGH (ref 78.0–100.0)
MCV: 105.7 fL — ABNORMAL HIGH (ref 78.0–100.0)
Platelets: 60 K/uL — ABNORMAL LOW (ref 150–400)
Platelets: 66 10*3/uL — ABNORMAL LOW (ref 150–400)
RBC: 2.92 MIL/uL — ABNORMAL LOW (ref 4.22–5.81)
RBC: 3.18 MIL/uL — ABNORMAL LOW (ref 4.22–5.81)
RDW: 18.4 % — ABNORMAL HIGH (ref 11.5–15.5)
RDW: 18.7 % — ABNORMAL HIGH (ref 11.5–15.5)
WBC: 8 K/uL (ref 4.0–10.5)
WBC: 10.3 10*3/uL (ref 4.0–10.5)

## 2014-05-06 LAB — COMPREHENSIVE METABOLIC PANEL WITH GFR
ALT: 311 U/L — ABNORMAL HIGH (ref 0–53)
AST: 188 U/L — ABNORMAL HIGH (ref 0–37)
Albumin: 2.7 g/dL — ABNORMAL LOW (ref 3.5–5.2)
Alkaline Phosphatase: 97 U/L (ref 39–117)
Anion gap: 9 (ref 5–15)
BUN: 77 mg/dL — ABNORMAL HIGH (ref 6–23)
CO2: 33 mmol/L — ABNORMAL HIGH (ref 19–32)
Calcium: 8.4 mg/dL (ref 8.4–10.5)
Chloride: 91 mmol/L — ABNORMAL LOW (ref 96–112)
Creatinine, Ser: 2.17 mg/dL — ABNORMAL HIGH (ref 0.50–1.35)
GFR calc Af Amer: 30 mL/min — ABNORMAL LOW (ref >90)
GFR calc non Af Amer: 26 mL/min — ABNORMAL LOW (ref >90)
Glucose, Bld: 268 mg/dL — ABNORMAL HIGH (ref 70–99)
Potassium: 3 mmol/L — ABNORMAL LOW (ref 3.5–5.1)
Sodium: 133 mmol/L — ABNORMAL LOW (ref 135–145)
Total Bilirubin: 2.5 mg/dL — ABNORMAL HIGH (ref 0.3–1.2)
Total Protein: 6.4 g/dL (ref 6.0–8.3)

## 2014-05-06 LAB — LACTIC ACID, PLASMA: Lactic Acid, Venous: 1.4 mmol/L (ref 0.5–2.0)

## 2014-05-06 LAB — MAGNESIUM
Magnesium: 2.5 mg/dL (ref 1.5–2.5)
Magnesium: 2.7 mg/dL — ABNORMAL HIGH (ref 1.5–2.5)

## 2014-05-06 LAB — LACTATE DEHYDROGENASE: LDH: 309 U/L — AB (ref 94–250)

## 2014-05-06 MED ORDER — MAGNESIUM SULFATE IN D5W 10-5 MG/ML-% IV SOLN
1.0000 g | Freq: Once | INTRAVENOUS | Status: AC
  Administered 2014-05-06: 1 g via INTRAVENOUS
  Filled 2014-05-06: qty 100

## 2014-05-06 MED ORDER — POTASSIUM CHLORIDE CRYS ER 20 MEQ PO TBCR
40.0000 meq | EXTENDED_RELEASE_TABLET | Freq: Once | ORAL | Status: AC
  Administered 2014-05-06: 40 meq via ORAL
  Filled 2014-05-06: qty 2

## 2014-05-06 MED ORDER — INSULIN ASPART 100 UNIT/ML ~~LOC~~ SOLN: 0.0000 [IU] | Freq: Every day | SUBCUTANEOUS | Status: DC

## 2014-05-06 MED ORDER — POTASSIUM CHLORIDE CRYS ER 20 MEQ PO TBCR: 40.0000 meq | EXTENDED_RELEASE_TABLET | Freq: Two times a day (BID) | ORAL | Status: DC

## 2014-05-06 MED ORDER — PROCHLORPERAZINE EDISYLATE 5 MG/ML IJ SOLN
5.0000 mg | Freq: Four times a day (QID) | INTRAMUSCULAR | Status: DC | PRN
  Administered 2014-05-06: 5 mg via INTRAVENOUS
  Filled 2014-05-06 (×2): qty 1

## 2014-05-06 MED ORDER — INSULIN ASPART 100 UNIT/ML ~~LOC~~ SOLN
0.0000 [IU] | Freq: Three times a day (TID) | SUBCUTANEOUS | Status: DC
  Administered 2014-05-06: 1 [IU] via SUBCUTANEOUS
  Administered 2014-05-06: 2 [IU] via SUBCUTANEOUS

## 2014-05-06 MED ORDER — POTASSIUM CHLORIDE CRYS ER 20 MEQ PO TBCR
40.0000 meq | EXTENDED_RELEASE_TABLET | Freq: Four times a day (QID) | ORAL | Status: DC
  Administered 2014-05-06: 40 meq via ORAL
  Filled 2014-05-06 (×2): qty 2

## 2014-05-06 NOTE — Evaluation (Signed)
Physical Therapy Evaluation Patient Details Name: [name] BoyerJohn E Mittelstadt MRN: [id number] DOB: 17-May-1926 Today's Date: 05/06/2014   History of Present Illness  Pt adm with acute on chronic systolic heart failure. PMH - afib, CKD, CABG  Clinical Impression  Pt admitted with above diagnosis. Pt currently with functional limitations due to the deficits listed below (see PT Problem List).  Pt will benefit from skilled PT to increase their independence and safety with mobility to allow discharge to the venue listed below.  If pt progresses steadily may be a candidate for CIR.     Follow Up Recommendations SNF    Equipment Recommendations  Other (comment) (to be assessed)    Recommendations for Other Services       Precautions / Restrictions        Mobility  Bed Mobility Overal bed mobility: Needs Assistance Bed Mobility: Supine to Sit     Supine to sit: Mod assist;HOB elevated     General bed mobility comments: Assist to elevate trunk and bring hips to EOB.  Transfers Overall transfer level: Needs assistance Equipment used: Rolling walker (2 wheeled) Transfers: Sit to/from UGI CorporationStand;Stand Pivot Transfers Sit to Stand: Mod assist;+2 safety/equipment Stand pivot transfers: Mod assist;+2 safety/equipment       General transfer comment: Verbal cues for hand placement. Assist to bring hips up and for balance. Pt with posterior lean.  Ambulation/Gait Ambulation/Gait assistance: Mod assist;+2 safety/equipment Ambulation Distance (Feet): 10 Feet Assistive device: 4-wheeled walker Gait Pattern/deviations: Step-through pattern;Decreased step length - right;Decreased step length - left;Shuffle;Trunk flexed;Leaning posteriorly   Gait velocity interpretation: Below normal speed for age/gender General Gait Details: Assist for balance and support.   Stairs            Wheelchair Mobility    Modified Rankin (Stroke Patients Only)       Balance Overall balance assessment: Needs  assistance Sitting-balance support: No upper extremity supported;Feet supported Sitting balance-Leahy Scale: Fair     Standing balance support: Bilateral upper extremity supported Standing balance-Leahy Scale: Poor Standing balance comment: support of walker and min/mod A for static standing.                             Pertinent Vitals/Pain Pain Assessment: No/denies pain    Home Living       Type of Home: House Home Access: Level entry     Home Layout: One level Home Equipment: Walker - 2 wheels;Toilet riser      Prior Function Level of Independence: Independent with assistive device(s)         Comments: amb with rolling walker. Has had 2 falls in weeks prior to admission.     Hand Dominance        Extremity/Trunk Assessment   Upper Extremity Assessment: Defer to OT evaluation           Lower Extremity Assessment: Generalized weakness         Communication   Communication: HOH  Cognition Arousal/Alertness: Awake/alert Behavior During Therapy: WFL for tasks assessed/performed Overall Cognitive Status: Within Functional Limits for tasks assessed                      General Comments      Exercises        Assessment/Plan    PT Assessment Patient needs continued PT services  PT Diagnosis Difficulty walking;Generalized weakness   PT Problem List Decreased strength;Decreased activity tolerance;Decreased balance;Decreased mobility;Cardiopulmonary status limiting  activity  PT Treatment Interventions DME instruction;Gait training;Functional mobility training;Patient/family education;Therapeutic activities;Therapeutic exercise;Balance training   PT Goals (Current goals can be found in the Care Plan section) Acute Rehab PT Goals Patient Stated Goal: return home PT Goal Formulation: With patient/family Time For Goal Achievement: 05/20/14 Potential to Achieve Goals: Good    Frequency Min 3X/week   Barriers to discharge  Decreased caregiver support      Co-evaluation               End of Session Equipment Utilized During Treatment: Gait belt;Oxygen Activity Tolerance: Patient tolerated treatment well Patient left: in chair;with call bell/phone within reach;with chair alarm set;with family/visitor present Nurse Communication: Mobility status;Other (comment) (daughter is able to provide assist for pt mobility with another staff member present. Daughter is a PT.)         Time: 1052-1129 PT Time Calculation (min) (ACUTE ONLY): 37 min   Charges:   PT Evaluation $Initial PT Evaluation Tier I: 1 Procedure PT Treatments $Gait Training: 8-22 mins   PT G Codes:        Mckala Pantaleon May 18, 2014, 4:09 PM  [facility] PT [phone]

## 2014-05-06 NOTE — Progress Notes (Signed)
Patient ID: [name], male   DOB: December 21, 1926, 81 y.o.   MRN: [id number]   SUBJECTIVE: Patient transitioned to Lasix gtt + milrinone 0.25 on 3/21, PICC placed.  Better UOP yesterday.  However, had frank blood initially after foley placement and Eliquis was held.  Now pink-tinged urine.  Run of VT last night, amiodarone gtt begun and no recurrence.  He is not dyspneic at rest.  Very weak overall. CVP 16 this morning with co-ox 54%.   Scheduled Meds: . acetaminophen  650 mg Oral q morning - 10a  . amiodarone  150 mg Intravenous Once  . antiseptic oral rinse  7 mL Mouth Rinse BID  . apixaban  2.5 mg Oral BID  . carvedilol  3.125 mg Oral BID WC  . insulin aspart  0-5 Units Subcutaneous QHS  . insulin aspart  0-9 Units Subcutaneous TID WC  . magnesium sulfate 1 - 4 g bolus IVPB  1 g Intravenous Once  . multivitamin with minerals  1 tablet Oral Daily  . potassium chloride  40 mEq Oral Once  . potassium chloride  40 mEq Oral Once  . potassium chloride  40 mEq Oral BID  . senna-docusate  1 tablet Oral QHS  . sodium chloride  1 spray Each Nare Daily  . sodium chloride  10-40 mL Intracatheter Q12H   Continuous Infusions: . amiodarone 30 mg/hr (05/06/14 0643)  . furosemide (LASIX) infusion 10 mg/hr (05/05/14 1020)  . milrinone 0.25 mcg/kg/min (05/05/14 2323)   PRN Meds:.acetaminophen, LORazepam, ondansetron (ZOFRAN) IV, polyvinyl alcohol, sodium chloride    Filed Vitals:   05/06/14 0400 05/06/14 0500 05/06/14 0600 05/06/14 0700  BP: 97/49 108/48 106/54 106/63  Pulse: 70 75 54 78  Temp:    97.7 F (36.5 C)  TempSrc:    Oral  Resp: Height:      Weight:      SpO2: 97% 97% 96% 96%    Intake/Output Summary (Last 24 hours) at 05/06/14 0814 Last data filed at 05/06/14 0700  Gross per 24 hour  Intake 697.62 ml  Output   2575 ml  Net -1877.38 ml    LABS: Basic Metabolic Panel:  Recent Labs  40/98/11 2358 05/06/14 0508  NA 135 133*  K 3.1* 3.0*  CL 94* 91*  CO2  33* 33*  GLUCOSE 167* 268*  BUN 85* 77*  CREATININE 2.27* 2.17*  CALCIUM 8.7 8.4  MG 2.5  --    Liver Function Tests:  Recent Labs  05/05/14 0345 05/06/14 0508  AST 417* 188*  ALT 475* 311*  ALKPHOS 104 97  BILITOT 2.8* 2.5*  PROT 6.8 6.4  ALBUMIN 2.9* 2.7*   No results for input(s): LIPASE, AMYLASE in the last 72 hours. CBC:  Recent Labs  05/06/14 0508  WBC 8.0  HGB 10.1*  HCT 30.8*  MCV 105.5*  PLT 60*   Cardiac Enzymes: No results for input(s): CKTOTAL, CKMB, CKMBINDEX, TROPONINI in the last 72 hours. BNP: Invalid input(s): POCBNP D-Dimer: No results for input(s): DDIMER in the last 72 hours. Hemoglobin A1C: No results for input(s): HGBA1C in the last 72 hours. Fasting Lipid Panel: No results for input(s): CHOL, HDL, LDLCALC, TRIG, CHOLHDL, LDLDIRECT in the last 72 hours. Thyroid Function Tests: No results for input(s): TSH, T4TOTAL, T3FREE, THYROIDAB in the last 72 hours.  Invalid input(s): FREET3 Anemia Panel: No results for input(s): VITAMINB12, FOLATE, FERRITIN, TIBC, IRON, RETICCTPCT in the last 72 hours.  RADIOLOGY: Dg Chest 2  View  05/03/2014   CLINICAL DATA:  Weakness shortness of breath congestion for 3 days, multiple falls in the past 2 weeks  EXAM: CHEST  2 VIEW  COMPARISON:  04/21/2014  FINDINGS: Stable moderate cardiac silhouette enlargement. Moderate vascular congestion. Moderate interstitial and alveolar edema. Unchanged small to moderate left effusion with left lower lobe consolidation.  IMPRESSION: Congestive heart failure with mildly increased pulmonary edema.   Electronically Signed   By: Esperanza Heiraymond  Rubner M.D.   On: 05/03/2014 10:30   Dg Chest 2 View  05/15/2014   CLINICAL DATA:  Initial evaluation for weakness, shortness of breath, history of atrial fibrillation  EXAM: CHEST  2 VIEW  COMPARISON:  12/09/2013  FINDINGS: Moderate enlargement of cardiac silhouette. Moderate vascular congestion. Peribronchial wall thickening and mild interstitial  prominence with mild hazy density right perihilar area. Chronic pleural parenchymal thickening left lung base. Cardiac defibrillator again noted.  IMPRESSION: Congestive heart failure with mild interstitial pulmonary edema   Electronically Signed   By: Esperanza Heiraymond  Rubner M.D.   On: 04/18/2014 10:43   Ct Head Wo Contrast  04/14/2014   CLINICAL DATA:  Status post fall; hit right superior orbital rim. Patient on blood thinners. Initial encounter.  EXAM: CT HEAD WITHOUT CONTRAST  TECHNIQUE: Contiguous axial images were obtained from the base of the skull through the vertex without intravenous contrast.  COMPARISON:  CT of the head performed 01/07/2005, and CTA of the head performed 04/10/2007  FINDINGS: There is no evidence of acute infarction, mass lesion, or intra- or extra-axial hemorrhage on CT.  Prominence of the ventricles and sulci reflects mild cortical volume loss. Cerebellar atrophy is noted. Mild periventricular and subcortical white matter change likely reflects small vessel ischemic microangiopathy.  The brainstem and fourth ventricle are within normal limits. The basal ganglia are unremarkable in appearance. The cerebral hemispheres demonstrate grossly normal gray-white differentiation. No mass effect or midline shift is seen.  There is no evidence of fracture; visualized osseous structures are unremarkable in appearance. The visualized portions of the orbits are within normal limits. The paranasal sinuses and mastoid air cells are well-aerated. Soft tissue swelling is noted overlying the right frontal calvarium, just superior to the right orbit.  IMPRESSION: 1. No evidence of traumatic intracranial injury or fracture. 2. Mild cortical volume loss and scattered small vessel ischemic microangiopathy. 3. Soft tissue swelling overlying the right frontal calvarium, just superior to the right orbit.   Electronically Signed   By: Roanna RaiderJeffery  Chang M.D.   On: 04/14/2014 22:22   [place] Abdomen Complete  05/02/2014    CLINICAL DATA:  Transaminasemia, hyperbilirubinemia, acute on chronic renal failure  EXAM: ULTRASOUND ABDOMEN COMPLETE  COMPARISON:  CT 01/12/2009  FINDINGS: Gallbladder: There are multiple calculi in the gallbladder lumen measuring up to 11 mm. There is no gallbladder wall thickening or pericholecystic fluid. The patient was not tender over the gallbladder.  Common bile duct: Diameter: 3.6 mm, normal  Liver: There is increased echogenicity of hepatic parenchyma consistent with fatty infiltration. No focal liver lesions are evident.  IVC: No abnormality visualized.  Pancreas: Not visualized  Spleen: Size and appearance within normal limits.  Right Kidney: Length: 10.7 cm. Echogenicity within normal limits. No mass or hydronephrosis visualized.  Left Kidney: Length: 13.1 cm. Echogenicity within normal limits. No mass or hydronephrosis visualized.  Abdominal aorta: Not visible  Other findings: Bilateral pleural effusions  IMPRESSION: 1. Cholelithiasis without evidence of cholecystitis 2. Dense liver, likely fatty infiltration 3. Nonvisualization of the pancreas and aorta  4. Unremarkable kidneys, negative for hydronephrosis 5. Bilateral pleural effusions   Electronically Signed   By: Ellery Plunk M.D.   On: 05/02/2014 06:05   Dg Chest Port 1 View  05/05/2014   CLINICAL DATA:  PICC line placement.  EXAM: PORTABLE CHEST - 1 VIEW  COMPARISON:  05/03/2014  FINDINGS: The heart is enlarged but stable. The PICC line tip is in the mid SVC at the level of the carina. No complicating features. The right ventricular pacer wires/AICD is stable. Persistent left pleural effusion and left lower lobe atelectasis or infiltrate. Slight improved right lung aeration.  IMPRESSION: Right PICC line tip is in the mid SVC.   Electronically Signed   By: Rudie Meyer M.D.   On: 05/05/2014 16:13    PHYSICAL EXAM General: NAD, frail Neck: JVP 16 cm, no thyromegaly or thyroid nodule.  Lungs: Clear to auscultation bilaterally with  normal respiratory effort. CV: Nondisplaced PMI.  Heart irregular S1/S2, +S3, 2/6 systolic murmur RUSB and apex.  2+ edema 1/2 up lower legs.   Abdomen: Soft, nontender, no hepatosplenomegaly, no distention.  Neurologic: Alert and oriented x 3.  Psych: Normal affect. Extremities: No clubbing or cyanosis.   TELEMETRY: Reviewed telemetry pt in atrial fibrillation in 70s  ASSESSMENT AND PLAN: 81 yo with history of CAD s/p CABG, ischemic cardiomyopathy EF 20-25% on last echo, persistent atrial fibrillation failed Tikosyn, and CKD presented with weakness, acute on chronic systolic CHF.  He was thought to have low output.  1. Acute on chronic systolic CHF: Ischemic cardiomyopathy.  Echo (3/16) with EF 20-25%, moderate MR, ?low gradient severe AS. [employer] ICD.  He has diuresed poorly since admission and creatinine has risen.  On 3/20, he was started on dobutamine due to low output heart failure but UOP improved minimally.  This was transitioned over to milrinone on 3/21 and Lasix gtt.  This morning, CVP 16 and co-ox 54%. UOP better yesterday than prior.  - Increase milrinone to 0.375, repeat co-ox in afternoon.  - Increase Lasix gtt to 15 mg/hr.  - Decreased Coreg to 3.125 mg bid.  - Hold digoxin for now with rising creatinine.   2. CAD: s/p CABG.  Last Cardiolite in 1/16 showed primarily scar with EF 15%.  No chest pain. On Eliquis so no ASA.  He has not been on a statin.  3. AKI on CKD: Creatinine elevated at baseline, BUN/creatinine rose steadily this admission but lower today after starting milrinone.  Suspect cardiorenal syndrome.  He is very volume overloaded, hopefully diuresis with lowering of renal venous pressure along with increased cardiac output from milrinone will help decrease creatinine.   4. Atrial fibrillation: Now persistent.  He failed Tikosyn so off.  He is on Eliquis 2.5 mg bid.  HR stable on milrinone. Now on amiodarone with VT last night.  Eventually, can try repeat  cardioversion but right now would be unlikely to hold sinus with cardioversion given milrinone use.  5. Aortic stenosis: Concern for possible low gradient severe AS.  When he recovers from current episode, could consider low dose DSE to further assess.  Probably not going to be a good TAVR candidate, though, and would have to have a lot of improvement to consider TAVR evaluation.  6. VT: Run of VT last night, amiodarone begun and now stable.  7. Hypokalemia: Replete K aggressively, recheck BMET this afternoon.  8. Hematuria: Probably foley trauma, urine starting to clear.  Irrigate foley.  Hold am Eliquis, will give Eliquis  this pm.  9. Very debilitated, needs PT/OT evaluation.   Marca Ancona 05/06/2014

## 2014-05-06 NOTE — Progress Notes (Signed)
Inpatient Diabetes Program Recommendations  AACE/ADA: New Consensus Statement on Inpatient Glycemic Control (2013)  Target Ranges:  Prepandial:   less than 140 mg/dL      Peak postprandial:   less than 180 mg/dL (1-2 hours)      Critically ill patients:  140 - 180 mg/dL  Results for [name], [name] (MRN [id number]) as of 05/06/2014 07:06  Ref. Range 05/05/2014 03:45 05/05/2014 16:25 05/05/2014 23:58 05/06/2014 05:00 05/06/2014 05:08  Glucose Latest Range: 70-99 mg/dL 409141 (H)  811167 (H)  914268 (H)   Recommend monitoring CBGs achs or Q4 when NPO.  May want to start Novolog sensitive scale.   Thank you  Piedad ClimesGina Elpidia Karn BSN, RN,CDE Inpatient Diabetes Coordinator [phone] (team pager)

## 2014-05-06 NOTE — Telephone Encounter (Signed)
LMOVM reminding pt to send remote transmission.   

## 2014-05-06 NOTE — Progress Notes (Signed)
Pt apparently worked with PT and is now ready to get back to bed. Assisted RN since pt is weak and has equipment. Did fairly well standing from recliner and pivoting however pt flopped down into bed, lacked control. Will see pt tomorrow as x2.  [id number] Ethelda ChickKristan Gillie Crisci CES, ACSM 3:21 PM 05/06/2014

## 2014-05-07 ENCOUNTER — Inpatient Hospital Stay (HOSPITAL_COMMUNITY): Payer: Medicare Other

## 2014-05-07 ENCOUNTER — Telehealth: Payer: Self-pay | Admitting: *Deleted

## 2014-05-07 ENCOUNTER — Encounter (HOSPITAL_COMMUNITY): Payer: Self-pay | Admitting: Internal Medicine

## 2014-05-07 ENCOUNTER — Encounter: Payer: Self-pay | Admitting: Cardiology

## 2014-05-07 DIAGNOSIS — J690 Pneumonitis due to inhalation of food and vomit: Secondary | ICD-10-CM

## 2014-05-07 DIAGNOSIS — J9601 Acute respiratory failure with hypoxia: Secondary | ICD-10-CM

## 2014-05-07 DIAGNOSIS — J811 Chronic pulmonary edema: Secondary | ICD-10-CM

## 2014-05-07 DIAGNOSIS — J810 Acute pulmonary edema: Secondary | ICD-10-CM

## 2014-05-07 DIAGNOSIS — I350 Nonrheumatic aortic (valve) stenosis: Secondary | ICD-10-CM

## 2014-05-07 DIAGNOSIS — R112 Nausea with vomiting, unspecified: Secondary | ICD-10-CM | POA: Diagnosis present

## 2014-05-07 LAB — BLOOD GAS, ARTERIAL
ACID-BASE EXCESS: 12.5 mmol/L — AB (ref 0.0–2.0)
Bicarbonate: 36.3 mEq/L — ABNORMAL HIGH (ref 20.0–24.0)
Drawn by: 313061
O2 CONTENT: 6 L/min
O2 Saturation: 86.1 %
PO2 ART: 51.2 mmHg — AB (ref 80.0–100.0)
Patient temperature: 98.6
TCO2: 37.6 mmol/L (ref 0–100)
pCO2 arterial: 43.9 mmHg (ref 35.0–45.0)
pH, Arterial: 7.527 — ABNORMAL HIGH (ref 7.350–7.450)

## 2014-05-07 LAB — GLUCOSE, CAPILLARY
Glucose-Capillary: 116 mg/dL — ABNORMAL HIGH (ref 70–99)
Glucose-Capillary: 149 mg/dL — ABNORMAL HIGH (ref 70–99)

## 2014-05-07 LAB — COMPREHENSIVE METABOLIC PANEL
ALBUMIN: 2.4 g/dL — AB (ref 3.5–5.2)
ALT: 242 U/L — AB (ref 0–53)
ANION GAP: 3 — AB (ref 5–15)
AST: 136 U/L — AB (ref 0–37)
Alkaline Phosphatase: 96 U/L (ref 39–117)
BUN: 68 mg/dL — ABNORMAL HIGH (ref 6–23)
CALCIUM: 8.8 mg/dL (ref 8.4–10.5)
CO2: 44 mmol/L (ref 19–32)
CREATININE: 2.24 mg/dL — AB (ref 0.50–1.35)
Chloride: 91 mmol/L — ABNORMAL LOW (ref 96–112)
GFR calc Af Amer: 29 mL/min — ABNORMAL LOW (ref >90)
GFR calc non Af Amer: 25 mL/min — ABNORMAL LOW (ref >90)
Glucose, Bld: 211 mg/dL — ABNORMAL HIGH (ref 70–99)
Potassium: 3.9 mmol/L (ref 3.5–5.1)
SODIUM: 138 mmol/L (ref 135–145)
Total Bilirubin: 3 mg/dL — ABNORMAL HIGH (ref 0.3–1.2)
Total Protein: 6.2 g/dL (ref 6.0–8.3)

## 2014-05-07 LAB — CBC
HCT: 37 % — ABNORMAL LOW (ref 39.0–52.0)
Hemoglobin: 11.8 g/dL — ABNORMAL LOW (ref 13.0–17.0)
MCH: 34.2 pg — AB (ref 26.0–34.0)
MCHC: 31.9 g/dL (ref 30.0–36.0)
MCV: 107.2 fL — ABNORMAL HIGH (ref 78.0–100.0)
Platelets: 71 10*3/uL — ABNORMAL LOW (ref 150–400)
RBC: 3.45 MIL/uL — ABNORMAL LOW (ref 4.22–5.81)
RDW: 19 % — ABNORMAL HIGH (ref 11.5–15.5)
WBC: 4.9 10*3/uL (ref 4.0–10.5)

## 2014-05-07 LAB — LACTIC ACID, PLASMA: LACTIC ACID, VENOUS: 2.4 mmol/L — AB (ref 0.5–2.0)

## 2014-05-07 LAB — OCCULT BLOOD GASTRIC / DUODENUM (SPECIMEN CUP): Occult Blood, Gastric: POSITIVE — AB

## 2014-05-07 LAB — PROCALCITONIN: Procalcitonin: 18.71 ng/mL

## 2014-05-07 LAB — CARBOXYHEMOGLOBIN
CARBOXYHEMOGLOBIN: 1.5 % (ref 0.5–1.5)
METHEMOGLOBIN: 0.8 % (ref 0.0–1.5)
O2 Saturation: 61 %
TOTAL HEMOGLOBIN: 12 g/dL — AB (ref 13.5–18.0)

## 2014-05-07 MED ORDER — PANTOPRAZOLE SODIUM 40 MG IV SOLR
40.0000 mg | Freq: Two times a day (BID) | INTRAVENOUS | Status: DC
  Administered 2014-05-07: 40 mg via INTRAVENOUS
  Filled 2014-05-07: qty 40

## 2014-05-07 MED ORDER — PROMETHAZINE HCL 25 MG/ML IJ SOLN: 12.5000 mg | Freq: Once | INTRAMUSCULAR | Status: DC

## 2014-05-07 MED ORDER — VANCOMYCIN HCL IN DEXTROSE 1-5 GM/200ML-% IV SOLN
1000.0000 mg | Freq: Once | INTRAVENOUS | Status: AC
Start: 2014-05-07 — End: 2014-05-07
  Administered 2014-05-07: 1000 mg via INTRAVENOUS
  Filled 2014-05-07: qty 200

## 2014-05-07 MED ORDER — METOCLOPRAMIDE HCL 5 MG/ML IJ SOLN
5.0000 mg | Freq: Four times a day (QID) | INTRAMUSCULAR | Status: DC
  Administered 2014-05-07: 5 mg via INTRAVENOUS
  Filled 2014-05-07 (×5): qty 1

## 2014-05-07 MED ORDER — NOREPINEPHRINE BITARTRATE 1 MG/ML IV SOLN
0.0000 ug/min | INTRAVENOUS | Status: DC
  Administered 2014-05-07: 20 ug/min via INTRAVENOUS
  Administered 2014-05-07: 10 ug/min via INTRAVENOUS
  Filled 2014-05-07 (×4): qty 4

## 2014-05-07 MED ORDER — POTASSIUM CHLORIDE 10 MEQ/100ML IV SOLN
10.0000 meq | INTRAVENOUS | Status: AC
  Administered 2014-05-07 (×4): 10 meq via INTRAVENOUS
  Filled 2014-05-07: qty 100

## 2014-05-07 MED ORDER — PIPERACILLIN-TAZOBACTAM 3.375 G IVPB
3.3750 g | Freq: Three times a day (TID) | INTRAVENOUS | Status: DC
Start: 2014-05-07 — End: 2014-05-07
  Administered 2014-05-07 (×2): 3.375 g via INTRAVENOUS
  Filled 2014-05-07 (×4): qty 50

## 2014-05-07 MED ORDER — SODIUM CHLORIDE 0.9 % IV BOLUS (SEPSIS)
250.0000 mL | Freq: Once | INTRAVENOUS | Status: AC
  Administered 2014-05-07: 250 mL via INTRAVENOUS

## 2014-05-07 MED ORDER — METOCLOPRAMIDE HCL 5 MG/ML IJ SOLN
10.0000 mg | Freq: Once | INTRAMUSCULAR | Status: DC
  Filled 2014-05-07: qty 2

## 2014-05-07 MED ORDER — MORPHINE SULFATE 25 MG/ML IV SOLN
1.0000 mg/h | INTRAVENOUS | Status: DC
  Administered 2014-05-07: 10 mg/h via INTRAVENOUS
  Administered 2014-05-08: 12.5 mg/h via INTRAVENOUS
  Filled 2014-05-07 (×2): qty 10

## 2014-05-07 MED ORDER — MORPHINE SULFATE 2 MG/ML IJ SOLN
2.0000 mg | INTRAMUSCULAR | Status: DC | PRN
  Administered 2014-05-07: 2 mg via INTRAVENOUS
  Filled 2014-05-07: qty 1

## 2014-05-07 MED ORDER — MORPHINE BOLUS VIA INFUSION
5.0000 mg | INTRAVENOUS | Status: DC | PRN
  Administered 2014-05-08: 1 mg via INTRAVENOUS
  Filled 2014-05-07 (×2): qty 20

## 2014-05-07 MED ORDER — VANCOMYCIN HCL IN DEXTROSE 750-5 MG/150ML-% IV SOLN: 750.0000 mg | INTRAVENOUS | Status: DC

## 2014-05-07 NOTE — Progress Notes (Signed)
Patient on 6L [place] and O2 sat 80-84%. Patient put on non rebreather mask. O2 sat now 95%.

## 2014-05-07 NOTE — Progress Notes (Signed)
Patient had brown emesis overnight. Gastric occult blood test positive. Hgb 11.8. Total emesis and NG output is 3,26150ml.

## 2014-05-07 NOTE — Telephone Encounter (Signed)
[facility] - Client TELEPHONE ADVICE RECORD [facility] Patient Name: [name] Gender: Male DOB: Dec 06, 1926 Age: 81 Y 11 M 28 D Return Phone Number: [phone] (Primary) Address: [address] City/State/Zip: [place] [address] Client [facility] - Client Client Site [facility] Day Physician Plotnikov, Alex Contact Type Call Call Type Triage / Clinical Relationship To Patient Self Appointment Disposition EMR Appointment Not Necessary Info pasted into Epic Yes Return Phone Number [phone] (Primary) Chief Complaint Weakness, Generalized Initial Comment caller states he is weak all over PreDisposition Call Doctor Nurse Assessment Nurse: Elijah Birk, RN, Stark Bray Date/Time (Eastern Time): 26-May-2014 8:15:32 AM Confirm and document reason for call. If symptomatic, describe symptoms. ---Caller states he is weak all over, has progressed over the last 3 days. He had swelling in his legs, has had to re-hydrate. Had a caregiver at first, then trying to care for himself has been difficult. Has been in A fib for about 6 months. He needs to have someone come see him at his house. Seen on Tuesday, his doctor felt they had achieved most of the goals. Has the patient traveled out of the country within the last 30 days? ---Not Applicable Does the patient require triage? ---Yes Related visit to physician within the last 2 weeks? ---Yes Does the PT have any chronic conditions? (i.e. diabetes, asthma, etc.) ---Yes List chronic conditions. ---A fib Guidelines Guideline Title Affirmed Question Affirmed Notes Nurse Date/Time (Eastern Time) Weakness (Generalized) and Fatigue [1] SEVERE weakness (i.e., unable to walk or barely able to walk, requires support) AND [2] new onset or worsening Elijah Birk, RN, Stark Bray May 26, 2014 8:21:58 AM Disp. Time Lamount Cohen Time) Disposition Final User May 26, 2014 8:09:49 AM Send To Clinical  Follow Up Rochel Brome 05-26-2014 8:25:38 AM Send To RN Personal Elijah Birk, RN, Lynda May 26, 2014 8:31:23 AM 911 Follow Up Call Attempted Elijah Birk, RN, Lynda PLEASE NOTE: All timestamps contained within this report are represented as [place] Standard Time. CONFIDENTIALTY NOTICE: This fax transmission is intended only for the addressee. It contains information that is legally privileged, confidential or otherwise protected from use or disclosure. If you are not the intended recipient, you are strictly prohibited from reviewing, disclosing, copying using or disseminating any of this information or taking any action in reliance on or regarding this information. If you have received this fax in error, please notify us immediately by telephone so that we can arrange for its return to [place]. Phone: [phone], Toll-Free: [phone], Fax: [phone] Page: 2 of 2 Call Id: [id number] Disp. Time Lamount Cohen Time) Disposition Final User Reason: Patient thought nurse was going to call someone to come over. Advised to call 911 from his house phone when he hangs up with nurse. 05-26-2014 8:24:17 AM Call EMS 911 Now Yes Elijah Birk, RN, Irene Shipper Understands: Yes Disagree/Comply: Comply Care Advice Given Per Guideline CARE ADVICE given per Weakness and Fatigue (Adult) guideline. CALL EMS 911 NOW: Immediate medical attention is needed. You need to hang up and call 911 (or an ambulance). Probation officer Discretion: I'll call you back in a few minutes to be sure you were able to reach them.) BRING MEDICINES: * Please bring a list of your current medicines when you go to the Emergency Department (ER). After Care Instructions Given Call Event Type User Date / Time Description Comments User: Lily Lovings, RN Date/Time Lamount Cohen Time): May 26, 2014 8:25:14 AM Caller states he is on oxygen all the time & to just adjust the covers of his bed,  he begins to pant.

## 2014-05-07 NOTE — Progress Notes (Addendum)
CTSP secondary to acute onset of nausea and vomiting all day yesterday and has continued to vomit.  Abdomen is distended and firm to palpation.  Abdominal film showed Unremarkable bowel gas pattern; no free intra-abdominal air seen.Moderate amount of stool noted in the colon. Mild distention of the visualized colon may reflect mild colonic dysmotility.  He has vomited 350cc of brown emesis.  Will consult IM for further evaluation.  Lactic acid is normal and WBC is normal so I doubt ischemic bowel.  ? Whether the amio could be causing acute nausea.  He is hypokalemic so will replete with IV K+ 10 meq x 4 runs.

## 2014-05-07 NOTE — Progress Notes (Addendum)
Paged by nurse as patient's BP has been dropping since 3AM. Patient here for heart failure also had coffee ground emesis yesterday. On both lasix and milrinone, also on coreg, given his EF of 20-25%, high risk for cardiogenic shock. Will hold coreg for now given decompensation. Instructed nurse to hold IV lasix to allow BP to come up. If BP continue to be low, may need pressor.  Ramond DialSigned, Raiyan Dalesandro PA Pager: [phone]  Addendum: Also patient is on eliquis, hgb stable so far, however may need to hold. Will ask HF service to round sooner rather than later.

## 2014-05-07 NOTE — Progress Notes (Addendum)
Patient ID: [name], male   DOB: February 05, 1927, 81 y.o.   MRN: [id number]   SUBJECTIVE: Yesterday pm patient developed nausea/vomiting.  Suspect ileus on KUB, abdominal CT done showing distended stomach and duodenum.  NGT placed with > 2000 cc fluid return.  He was transiently hypotensive, Lasix gtt stopped.  SBP now in 80s.  CVP 3-4 this morning, co-ox 61% on milrinone 0.375.  Good UOP with 6 lb weight loss.  CXR with possible aspiration, patient started on Zosyn and on NRBM today.    Scheduled Meds: . acetaminophen  650 mg Oral q morning - 10a  . amiodarone  150 mg Intravenous Once  . antiseptic oral rinse  7 mL Mouth Rinse BID  . insulin aspart  0-5 Units Subcutaneous QHS  . insulin aspart  0-9 Units Subcutaneous TID WC  . metoCLOPramide (REGLAN) injection  10 mg Intravenous Once  . metoCLOPramide (REGLAN) injection  5 mg Intravenous 4 times per day  . piperacillin-tazobactam (ZOSYN)  IV  3.375 g Intravenous 3 times per day  . promethazine  12.5 mg Intravenous Once  . sodium chloride  1 spray Each Nare Daily  . sodium chloride  250 mL Intravenous Once  . sodium chloride  10-40 mL Intracatheter Q12H   Continuous Infusions: . amiodarone 30 mg/hr (05/07/14 0413)  . milrinone 0.375 mcg/kg/min (05/06/14 2332)  . norepinephrine (LEVOPHED) Adult infusion     PRN Meds:.acetaminophen, LORazepam, ondansetron (ZOFRAN) IV, polyvinyl alcohol, prochlorperazine, sodium chloride    Filed Vitals:   05/07/14 0700 05/07/14 0715 05/07/14 0730 05/07/14 0745  BP: 86/46  Pulse: 101 100 113 100  Temp:      TempSrc:      Resp: 33 32 38 36  Height:      Weight:      SpO2: 97% 96% 97% 96%    Intake/Output Summary (Last 24 hours) at 05/07/14 0803 Last data filed at 05/07/14 4098  Gross per 24 hour  Intake 1879.81 ml  Output   5150 ml  Net -3270.19 ml    LABS: Basic Metabolic Panel:  Recent Labs  11/91/47 2358  05/06/14 1400 05/06/14 2300 05/07/14 0522  NA 135  < > 138  138 138  K 3.1*  < > 3.2* 3.2* 3.9  CL 94*  < > 92* 88* 91*  CO2 33*  < > 34* 38* 44*  GLUCOSE 167*  < > 165* 269* 211*  BUN 85*  < > 71* 67* 68*  CREATININE 2.27*  < > 2.02* 2.06* 2.24*  CALCIUM 8.7  < > 9.2 8.9 8.8  MG 2.5  --  2.7*  --   --   < > = values in this interval not displayed. Liver Function Tests:  Recent Labs  05/06/14 0508 05/07/14 0522  AST 188* 136*  ALT 311* 242*  ALKPHOS 97 96  BILITOT 2.5* 3.0*  PROT 6.4 6.2  ALBUMIN 2.7* 2.4*   No results for input(s): LIPASE, AMYLASE in the last 72 hours. CBC:  Recent Labs  05/06/14 2300 05/07/14 0522  WBC 10.3 4.9  HGB 10.9* 11.8*  HCT 33.6* 37.0*  MCV 105.7* 107.2*  PLT 66* 71*   Cardiac Enzymes: No results for input(s): CKTOTAL, CKMB, CKMBINDEX, TROPONINI in the last 72 hours. BNP: Invalid input(s): POCBNP D-Dimer: No results for input(s): DDIMER in the last 72 hours. Hemoglobin A1C: No results for input(s): HGBA1C in the last 72 hours. Fasting Lipid Panel: No results for input(s): CHOL, HDL, LDLCALC,  TRIG, CHOLHDL, LDLDIRECT in the last 72 hours. Thyroid Function Tests: No results for input(s): TSH, T4TOTAL, T3FREE, THYROIDAB in the last 72 hours.  Invalid input(s): FREET3 Anemia Panel: No results for input(s): VITAMINB12, FOLATE, FERRITIN, TIBC, IRON, RETICCTPCT in the last 72 hours.  RADIOLOGY: Ct Abdomen Pelvis Wo Contrast  05/07/2014   CLINICAL DATA:  81 year old male with nausea and vomiting.  EXAM: CT ABDOMEN AND PELVIS WITHOUT CONTRAST  TECHNIQUE: Multidetector CT imaging of the abdomen and pelvis was performed following the standard protocol without IV contrast.  COMPARISON:  Abdominal radiograph 1 day prior.  CT 01/12/2009  FINDINGS: Small to moderate bilateral pleural effusions and adjacent compressive atelectasis. Pacemaker wires are partially included.  There is an enteric tube with tip in the stomach. Despite enteric tube placement, the stomach remains mildly distended with fluid. The  duodenum appears distended with fluid. A proximal small bowel loops are obscured. There are no dilated small bowel loops to suggest obstruction. Portions of the abdomen are obscured by dense streak artifact from coils. Small to moderate stool in the right colon. Enteric chain sutures are noted at the sigmoid colon. Equivocal bowel wall thickening adjacent to the enteric sutures. The majority of the colon is decompressed.  Detailed evaluation is limited given lack of contrast and patient motion artifact. Calcified gallstones within physiologically distended gallbladder. The unenhanced liver is unremarkable. Small splenic granulomas. Distal pancreas is normal, proximal mid pancreas are obscured. There is no hydronephrosis. Mild thinning of both renal parenchyma.  Patient is post abdominal aortic aneurysm repair. The aneurysm sac has increased from prior exam currently measuring 5.9 x 7.3 cm, there are adjacent coils from presumed endoleak repair. The coils cause significant surrounding streak artifact. No definite periaortic soft tissue stranding.  No free intra-abdominal air. Small amount of free fluid in the left pericolic gutter. There is whole body wall edema consistent with third spacing.  Within the pelvis the urinary bladder is decompressed by Foley catheter. Prostate gland appears prominent in size.  Minimal compression deformity superior endplate of L2, age indeterminate but new from prior exam. Scattered bone islands are seen.  IMPRESSION: 1. Fluid distending the stomach and proximal duodenum, with an enteric tube in place. The remaining bowel loops are decompressed. This can be seen in the setting of gastroparesis. 2. Abdominal aortic aneurysm with coiling of presumed endoleak. The aneurysm sac has increased from exam 6 years prior, no interval exams to evaluate for comparison. Currently the aneurysm sac measures 5.9 x 7.3 cm. No definite surrounding soft tissue stranding, however streak artifact from coils  partially limits evaluation. 3. Cholelithiasis without CT findings of cholecystitis. 4. Bilateral pleural effusions with adjacent compressive atelectasis in both lower lobes. Whole body wall edema and small amount of intra-abdominal free fluid. Constellation of findings consistent with congestive heart failure and third spacing. 5. Questionable bowel wall thickening in the sigmoid colon adjacent to enteric chain sutures. This simply could be related to nondistention, short segment of bowel inflammation is considered.   Electronically Signed   By: Rubye Oaks M.D.   On: 05/07/2014 05:11   Dg Chest 2 View  05/03/2014   CLINICAL DATA:  Weakness shortness of breath congestion for 3 days, multiple falls in the past 2 weeks  EXAM: CHEST  2 VIEW  COMPARISON:  05/07/2014  FINDINGS: Stable moderate cardiac silhouette enlargement. Moderate vascular congestion. Moderate interstitial and alveolar edema. Unchanged small to moderate left effusion with left lower lobe consolidation.  IMPRESSION: Congestive heart failure with mildly  increased pulmonary edema.   Electronically Signed   By: Esperanza Heiraymond  Rubner M.D.   On: 05/03/2014 10:30   Dg Chest 2 View  04/25/2014   CLINICAL DATA:  Initial evaluation for weakness, shortness of breath, history of atrial fibrillation  EXAM: CHEST  2 VIEW  COMPARISON:  12/09/2013  FINDINGS: Moderate enlargement of cardiac silhouette. Moderate vascular congestion. Peribronchial wall thickening and mild interstitial prominence with mild hazy density right perihilar area. Chronic pleural parenchymal thickening left lung base. Cardiac defibrillator again noted.  IMPRESSION: Congestive heart failure with mild interstitial pulmonary edema   Electronically Signed   By: Esperanza Heiraymond  Rubner M.D.   On: 04/27/2014 10:43   Ct Head Wo Contrast  04/14/2014   CLINICAL DATA:  Status post fall; hit right superior orbital rim. Patient on blood thinners. Initial encounter.  EXAM: CT HEAD WITHOUT CONTRAST   TECHNIQUE: Contiguous axial images were obtained from the base of the skull through the vertex without intravenous contrast.  COMPARISON:  CT of the head performed 01/07/2005, and CTA of the head performed 04/10/2007  FINDINGS: There is no evidence of acute infarction, mass lesion, or intra- or extra-axial hemorrhage on CT.  Prominence of the ventricles and sulci reflects mild cortical volume loss. Cerebellar atrophy is noted. Mild periventricular and subcortical white matter change likely reflects small vessel ischemic microangiopathy.  The brainstem and fourth ventricle are within normal limits. The basal ganglia are unremarkable in appearance. The cerebral hemispheres demonstrate grossly normal gray-white differentiation. No mass effect or midline shift is seen.  There is no evidence of fracture; visualized osseous structures are unremarkable in appearance. The visualized portions of the orbits are within normal limits. The paranasal sinuses and mastoid air cells are well-aerated. Soft tissue swelling is noted overlying the right frontal calvarium, just superior to the right orbit.  IMPRESSION: 1. No evidence of traumatic intracranial injury or fracture. 2. Mild cortical volume loss and scattered small vessel ischemic microangiopathy. 3. Soft tissue swelling overlying the right frontal calvarium, just superior to the right orbit.   Electronically Signed   By: Roanna RaiderJeffery  Chang M.D.   On: 04/14/2014 22:22   [place] Abdomen Complete  05/02/2014   CLINICAL DATA:  Transaminasemia, hyperbilirubinemia, acute on chronic renal failure  EXAM: ULTRASOUND ABDOMEN COMPLETE  COMPARISON:  CT 01/12/2009  FINDINGS: Gallbladder: There are multiple calculi in the gallbladder lumen measuring up to 11 mm. There is no gallbladder wall thickening or pericholecystic fluid. The patient was not tender over the gallbladder.  Common bile duct: Diameter: 3.6 mm, normal  Liver: There is increased echogenicity of hepatic parenchyma consistent with  fatty infiltration. No focal liver lesions are evident.  IVC: No abnormality visualized.  Pancreas: Not visualized  Spleen: Size and appearance within normal limits.  Right Kidney: Length: 10.7 cm. Echogenicity within normal limits. No mass or hydronephrosis visualized.  Left Kidney: Length: 13.1 cm. Echogenicity within normal limits. No mass or hydronephrosis visualized.  Abdominal aorta: Not visible  Other findings: Bilateral pleural effusions  IMPRESSION: 1. Cholelithiasis without evidence of cholecystitis 2. Dense liver, likely fatty infiltration 3. Nonvisualization of the pancreas and aorta 4. Unremarkable kidneys, negative for hydronephrosis 5. Bilateral pleural effusions   Electronically Signed   By: Ellery Plunkaniel R Mitchell M.D.   On: 05/02/2014 06:05   Dg Chest Port 1 View  05/06/2014   CLINICAL DATA:  Vomiting, cough.  EXAM: PORTABLE CHEST - 1 VIEW  COMPARISON:  Chest radiograph obtained yesterday as well as 05/03/2014.  FINDINGS: Tip of the right  upper extremity PICC in the mid SVC. Single lead left-sided pacemaker remains in place. Patient is post median sternotomy. Unchanged retrocardiac opacity in left pleural effusion. There is increasing hazy opacity at the right lung base likely combination of pleural effusion and airspace disease. Mild vascular congestion, diminished pulmonary edema. There is stable cardiomegaly. No pneumothorax.  IMPRESSION: Increasing hazy opacity at the right lung base likely combination of pleural effusion and airspace disease, may reflect atelectasis, pneumonia versus aspiration. Unchanged retrocardiac opacity and left pleural effusion.   Electronically Signed   By: Rubye Oaks M.D.   On: 05/06/2014 23:17   Dg Chest Port 1 View  05/05/2014   CLINICAL DATA:  PICC line placement.  EXAM: PORTABLE CHEST - 1 VIEW  COMPARISON:  05/03/2014  FINDINGS: The heart is enlarged but stable. The PICC line tip is in the mid SVC at the level of the carina. No complicating features. The  right ventricular pacer wires/AICD is stable. Persistent left pleural effusion and left lower lobe atelectasis or infiltrate. Slight improved right lung aeration.  IMPRESSION: Right PICC line tip is in the mid SVC.   Electronically Signed   By: Rudie Meyer M.D.   On: 05/05/2014 16:13   Dg Abd Portable 1v  05/06/2014   CLINICAL DATA:  Acute onset of vomiting.  Initial encounter.  EXAM: PORTABLE ABDOMEN - 1 VIEW  COMPARISON:  CT of the abdomen and pelvis performed 01/12/2009  FINDINGS: The visualized bowel gas pattern is unremarkable. Scattered air and stool filled loops of colon are seen; no abnormal dilatation of small bowel loops is seen to suggest small bowel obstruction. Much of the visualized colon is partially distended with air. No free intra-abdominal air is identified, though evaluation for free air is limited on a single supine view.  An aortoiliac stent graft is partially characterized. Coiling is noted at the distal abdominal aorta. Scattered postoperative change is seen at the left lower quadrant.  The visualized osseous structures are within normal limits; the sacroiliac joints are unremarkable in appearance.  IMPRESSION: Unremarkable bowel gas pattern; no free intra-abdominal air seen. Moderate amount of stool noted in the colon. Mild distention of the visualized colon may reflect mild colonic dysmotility.   Electronically Signed   By: Roanna Raider M.D.   On: 05/06/2014 23:19    PHYSICAL EXAM General: NAD, frail Neck: JVP not elevated, no thyromegaly or thyroid nodule.  Lungs: Diffuse rhonchi. CV: Nondisplaced PMI.  Heart irregular S1/S2, +S3, 2/6 systolic murmur RUSB and apex.  1+ edema ankles.    Abdomen: Soft, nontender, no hepatosplenomegaly, no distention.  Neurologic: Alert and oriented x 3.  Psych: Normal affect. Extremities: No clubbing or cyanosis.   TELEMETRY: Reviewed telemetry pt in atrial fibrillation in 100s  ASSESSMENT AND PLAN: 81 yo with history of CAD s/p CABG,  ischemic cardiomyopathy EF 20-25% on last echo, persistent atrial fibrillation failed Tikosyn, and CKD presented with weakness, acute on chronic systolic CHF.  He was thought to have low output.  1. Acute on chronic systolic CHF: Ischemic cardiomyopathy.  Echo (3/16) with EF 20-25%, moderate MR, ?low gradient severe AS. [employer] ICD.  He has diuresed poorly since admission and creatinine has risen.  On 3/20, he was started on dobutamine due to low output heart failure but UOP improved minimally.  This was transitioned over to milrinone on 3/21 and Lasix gtt.  He diuresed well yesterday, CVP down to 3-4 and Lasix gtt stopped with transient hypotension.  BP remains soft now.  -  Continue current milrinone.   - Hold Lasix for now with low CVP.  With BP down, will give gentle 250 cc bolus.  Will have norepinephrine available if SBP falls < 85 consistently.   - Coreg and digoxin held for now.   2. CAD: s/p CABG.  Last Cardiolite in 1/16 showed primarily scar with EF 15%.  No chest pain. On Eliquis so no ASA.  He has not been on a statin.  3. AKI on CKD: Creatinine elevated at baseline, BUN/creatinine rose steadily this admission.  Suspect cardiorenal syndrome.  Creatinine mildly higher today.  CVP 3-4 with decreased BP, will hold Lasix and give touch of fluid.   4. Atrial fibrillation: Now persistent.  He failed Tikosyn so off.  HR stable on milrinone for now. Had VT on 3/21 and on amiodarone gtt.  Eventually, can try repeat cardioversion but right now would be unlikely to hold sinus with cardioversion given milrinone use. Eliquis on hold with coffee grounds emesis yesterday and NPO with ileus.  Hemoglobin actually higher today (likely hemoconcentration).  Will restart anticoagulation (probably IV heparin) if no further bleeding but will hold for now.  5. Aortic stenosis: Concern for possible low gradient severe AS.  When he recovers from current episode, could consider low dose DSE to further assess.   Probably not going to be a good TAVR candidate, though, and would have to have a lot of improvement to consider TAVR evaluation.  6. VT: Run of VT 3/21, amiodarone begun and now stable.  7. Aspiration pneumonitis: Suspect aspiration pneumonitis with vomiting yesterday, new diffuse rhonchi on lung exam.  He is now on Zosyn and has NGT.  Now on NRBM.  Will have CCM see him.  8. Nausea/vomiting: Improved with NGT, now NPO.  Abdomen soft this morning.  CT abdomen with distended stomach and duodenum, suspect ileus.  He is on Reglan.  9. Hematuria: Probably foley trauma, urine has cleared. 10. Very debilitated, will need SNF at discharge.   45 minutes critical care time.   Marca Ancona 05/07/2014  I spoke with daughter (POA).  He will be DNI but not DNR at this time.  BP is lower, getting IV fluid bolus and will start norepinephrine.  Lethargic, on NRBM.  CCM involved now.   Marca Ancona 05/07/2014

## 2014-05-07 NOTE — Consult Note (Signed)
Reason for Consult: Nausea vomiting. Referring Physician: Dr. Radford Pax.  [name] is an 81 y.o. male.  HPI: With history of chronic ischemic cardiomyopathy last year measured was 20-25% presently on milrinone infusion and Lasix infusion, persistent atrial fibrillation on Apixaban and amiodarone and Coreg, CAD status post CABG acute on chronic kidney disease was noticed to have increasing nausea vomiting since yesterday morning. On exam patient's abdomen is distended and there is a small loop of swelling around the epigastric area. Patient denies any abdominal pain and his last bowel movement was 4 days ago. KUB shows possible dysmotility and chest x-ray shows possible consolidation worsening. On exam patient is still throwing up. Consult was called for further management of his nausea and vomiting. Labs revealed normal lactic acid level.  Past Medical History  Diagnosis Date  . Atrial fibrillation     AFIB  . Ischemic cardiomyopathy     CABG in 1991; last catheter 2005 with patent vein graft to PD, diagonal, marginal/echo 2010 EF 25%.  . implantable cardiac defibrillator -single chamber[V45.02]     Chemical engineer  . Abdominal aneurysm without mention of rupture     s/p stent graft with endoleak requiring restenting-[facility]  . Chronic systolic heart failure     SYSTOLIC .Marland KitchenACUTE ON CHRONIC  . Hypertension   . Peripheral vascular disease, unspecified   . Hemoptysis   . Acute prostatitis   . Labyrinthitis, unspecified   . Unspecified hearing loss   . Cervicalgia   . Stroke   . CKD (chronic kidney disease), stage III   . Chronic anticoagulation     Coumadin    Past Surgical History  Procedure Laterality Date  . Insert / replace / remove pacemaker  2006    AICD GUIDANT VITALITY..IN SITU  . Colectomy  07/15/96  . Colostomy  07/15/96    COLOSTOMY TAKEN DOWN 01/14/97  . Coronary artery bypass graft  03/22/89  . Cardiac catheterization  2001    ANGIOPLASTY W/STENT RCA  . Abdominal aortic  aneurysm repair  05/1999    STENT, ENDOVASCULAR RELINING OF GRAFT 3/11  . Cardioversion    . Tee without cardioversion N/A 12/12/2013    Procedure: TRANSESOPHAGEAL ECHOCARDIOGRAM (TEE);  Surgeon: Dorothy Spark, MD;  Location: [facility] ENDOSCOPY;  Service: Cardiovascular;  Laterality: N/A;  . Cardioversion N/A 01/06/2014    Procedure: CARDIOVERSION;  Surgeon: Larey Dresser, MD;  Location: [facility];  Service: Cardiovascular;  Laterality: N/A;  . Implantable cardioverter defibrillator (icd) generator change N/A 01/18/2011    Procedure: ICD GENERATOR CHANGE;  Surgeon: Deboraha Sprang, MD;  Location: [facility] CATH LAB;  Service: Cardiovascular;  Laterality: N/A;  . Cardioversion N/A 02/10/2014    Procedure: CARDIOVERSION;  Surgeon: Sueanne Margarita, MD;  Location: [facility] ENDOSCOPY;  Service: Cardiovascular;  Laterality: N/A;    Family History  Problem Relation Age of Onset  . Stroke Mother   . Heart attack Father     X 6  . Heart disease Father   . Rheum arthritis Sister   . Heart disease Brother     Social History:  reports that he quit smoking about 43 years ago. He does not have any smokeless tobacco history on file. He reports that he does not drink alcohol or use illicit drugs.  Allergies:  Allergies  Allergen Reactions  . Benazepril Cough  . Promethazine Hcl Other (See Comments)    REACTION: world spins..    Medications: I have reviewed the patient's current medications.  Results for  orders placed or performed during the hospital encounter of 05/04/2014 (from the past 48 hour(s))  Comprehensive metabolic panel     Status: Abnormal   Collection Time: 05/05/14  3:45 AM  Result Value Ref Range   Sodium 136 135 - 145 mmol/L   Potassium 4.2 3.5 - 5.1 mmol/L   Chloride 97 96 - 112 mmol/L   CO2 26 19 - 32 mmol/L   Glucose, Bld 141 (H) 70 - 99 mg/dL   BUN 94 (H) 6 - 23 mg/dL   Creatinine, Ser 2.58 (H) 0.50 - 1.35 mg/dL   Calcium 9.2 8.4 - 10.5 mg/dL   Total Protein 6.8 6.0 - 8.3 g/dL    Albumin 2.9 (L) 3.5 - 5.2 g/dL   AST 417 (H) 0 - 37 U/L   ALT 475 (H) 0 - 53 U/L   Alkaline Phosphatase 104 39 - 117 U/L   Total Bilirubin 2.8 (H) 0.3 - 1.2 mg/dL   GFR calc non Af Amer 21 (L) >90 mL/min   GFR calc Af Amer 24 (L) >90 mL/min    Comment: (NOTE) The eGFR has been calculated using the CKD EPI equation. This calculation has not been validated in all clinical situations. eGFR's persistently <90 mL/min signify possible Chronic Kidney Disease.    Anion gap 13 5 - 15  Ammonia     Status: None   Collection Time: 05/05/14  3:45 AM  Result Value Ref Range   Ammonia 28 11 - 32 umol/L  Carboxyhemoglobin     Status: Abnormal   Collection Time: 05/05/14  4:25 PM  Result Value Ref Range   Total hemoglobin 10.8 (L) 13.5 - 18.0 g/dL   O2 Saturation 50.0 %   Carboxyhemoglobin 1.2 0.5 - 1.5 %   Methemoglobin 0.9 0.0 - 1.5 %  Basic metabolic panel     Status: Abnormal   Collection Time: 05/05/14 11:58 PM  Result Value Ref Range   Sodium 135 135 - 145 mmol/L   Potassium 3.1 (L) 3.5 - 5.1 mmol/L    Comment: DELTA CHECK NOTED   Chloride 94 (L) 96 - 112 mmol/L   CO2 33 (H) 19 - 32 mmol/L   Glucose, Bld 167 (H) 70 - 99 mg/dL   BUN 85 (H) 6 - 23 mg/dL   Creatinine, Ser 2.27 (H) 0.50 - 1.35 mg/dL   Calcium 8.7 8.4 - 10.5 mg/dL   GFR calc non Af Amer 24 (L) >90 mL/min   GFR calc Af Amer 28 (L) >90 mL/min    Comment: (NOTE) The eGFR has been calculated using the CKD EPI equation. This calculation has not been validated in all clinical situations. eGFR's persistently <90 mL/min signify possible Chronic Kidney Disease.    Anion gap 8 5 - 15  Magnesium     Status: None   Collection Time: 05/05/14 11:58 PM  Result Value Ref Range   Magnesium 2.5 1.5 - 2.5 mg/dL  Carboxyhemoglobin     Status: Abnormal   Collection Time: 05/06/14  5:00 AM  Result Value Ref Range   Total hemoglobin 10.1 (L) 13.5 - 18.0 g/dL   O2 Saturation 53.8 %   Carboxyhemoglobin 1.6 (H) 0.5 - 1.5 %    Methemoglobin 1.0 0.0 - 1.5 %  CBC     Status: Abnormal   Collection Time: 05/06/14  5:08 AM  Result Value Ref Range   WBC 8.0 4.0 - 10.5 K/uL   RBC 2.92 (L) 4.22 - 5.81 MIL/uL   Hemoglobin 10.1 (L)  13.0 - 17.0 g/dL   HCT 30.8 (L) 39.0 - 52.0 %   MCV 105.5 (H) 78.0 - 100.0 fL   MCH 34.6 (H) 26.0 - 34.0 pg   MCHC 32.8 30.0 - 36.0 g/dL   RDW 18.4 (H) 11.5 - 15.5 %   Platelets 60 (L) 150 - 400 K/uL    Comment: PLATELET COUNT CONFIRMED BY SMEAR  Comprehensive metabolic panel     Status: Abnormal   Collection Time: 05/06/14  5:08 AM  Result Value Ref Range   Sodium 133 (L) 135 - 145 mmol/L   Potassium 3.0 (L) 3.5 - 5.1 mmol/L   Chloride 91 (L) 96 - 112 mmol/L   CO2 33 (H) 19 - 32 mmol/L   Glucose, Bld 268 (H) 70 - 99 mg/dL   BUN 77 (H) 6 - 23 mg/dL   Creatinine, Ser 2.17 (H) 0.50 - 1.35 mg/dL   Calcium 8.4 8.4 - 10.5 mg/dL   Total Protein 6.4 6.0 - 8.3 g/dL   Albumin 2.7 (L) 3.5 - 5.2 g/dL   AST 188 (H) 0 - 37 U/L   ALT 311 (H) 0 - 53 U/L   Alkaline Phosphatase 97 39 - 117 U/L   Total Bilirubin 2.5 (H) 0.3 - 1.2 mg/dL   GFR calc non Af Amer 26 (L) >90 mL/min   GFR calc Af Amer 30 (L) >90 mL/min    Comment: (NOTE) The eGFR has been calculated using the CKD EPI equation. This calculation has not been validated in all clinical situations. eGFR's persistently <90 mL/min signify possible Chronic Kidney Disease.    Anion gap 9 5 - 15  Glucose, capillary     Status: Abnormal   Collection Time: 05/06/14 11:46 AM  Result Value Ref Range   Glucose-Capillary 157 (H) 70 - 99 mg/dL  Basic metabolic panel     Status: Abnormal   Collection Time: 05/06/14  2:00 PM  Result Value Ref Range   Sodium 138 135 - 145 mmol/L   Potassium 3.2 (L) 3.5 - 5.1 mmol/L   Chloride 92 (L) 96 - 112 mmol/L   CO2 34 (H) 19 - 32 mmol/L   Glucose, Bld 165 (H) 70 - 99 mg/dL   BUN 71 (H) 6 - 23 mg/dL   Creatinine, Ser 2.02 (H) 0.50 - 1.35 mg/dL   Calcium 9.2 8.4 - 10.5 mg/dL   GFR calc non Af Amer 28 (L)  >90 mL/min   GFR calc Af Amer 33 (L) >90 mL/min    Comment: (NOTE) The eGFR has been calculated using the CKD EPI equation. This calculation has not been validated in all clinical situations. eGFR's persistently <90 mL/min signify possible Chronic Kidney Disease.    Anion gap 12 5 - 15  Magnesium     Status: Abnormal   Collection Time: 05/06/14  2:00 PM  Result Value Ref Range   Magnesium 2.7 (H) 1.5 - 2.5 mg/dL  Glucose, capillary     Status: Abnormal   Collection Time: 05/06/14  4:18 PM  Result Value Ref Range   Glucose-Capillary 143 (H) 70 - 99 mg/dL  Glucose, capillary     Status: Abnormal   Collection Time: 05/06/14  9:23 PM  Result Value Ref Range   Glucose-Capillary 162 (H) 70 - 99 mg/dL   Comment 1 Capillary Specimen   Basic metabolic panel     Status: Abnormal   Collection Time: 05/06/14 11:00 PM  Result Value Ref Range   Sodium 138 135 - 145 mmol/L  Potassium 3.2 (L) 3.5 - 5.1 mmol/L   Chloride 88 (L) 96 - 112 mmol/L   CO2 38 (H) 19 - 32 mmol/L   Glucose, Bld 269 (H) 70 - 99 mg/dL   BUN 67 (H) 6 - 23 mg/dL   Creatinine, Ser 2.06 (H) 0.50 - 1.35 mg/dL   Calcium 8.9 8.4 - 10.5 mg/dL   GFR calc non Af Amer 28 (L) >90 mL/min   GFR calc Af Amer 32 (L) >90 mL/min    Comment: (NOTE) The eGFR has been calculated using the CKD EPI equation. This calculation has not been validated in all clinical situations. eGFR's persistently <90 mL/min signify possible Chronic Kidney Disease.    Anion gap 12 5 - 15  CBC     Status: Abnormal   Collection Time: 05/06/14 11:00 PM  Result Value Ref Range   WBC 10.3 4.0 - 10.5 K/uL   RBC 3.18 (L) 4.22 - 5.81 MIL/uL   Hemoglobin 10.9 (L) 13.0 - 17.0 g/dL   HCT 33.6 (L) 39.0 - 52.0 %   MCV 105.7 (H) 78.0 - 100.0 fL   MCH 34.3 (H) 26.0 - 34.0 pg   MCHC 32.4 30.0 - 36.0 g/dL   RDW 18.7 (H) 11.5 - 15.5 %   Platelets 66 (L) 150 - 400 K/uL    Comment: CONSISTENT WITH PREVIOUS RESULT  Carboxyhemoglobin     Status: Abnormal   Collection  Time: 05/06/14 11:00 PM  Result Value Ref Range   Total hemoglobin 11.4 (L) 13.5 - 18.0 g/dL   O2 Saturation 50.6 %   Carboxyhemoglobin 1.4 0.5 - 1.5 %   Methemoglobin 0.9 0.0 - 1.5 %  Lactate dehydrogenase     Status: Abnormal   Collection Time: 05/06/14 11:00 PM  Result Value Ref Range   LDH 309 (H) 94 - 250 U/L  Lactic acid, plasma     Status: None   Collection Time: 05/06/14 11:00 PM  Result Value Ref Range   Lactic Acid, Venous 1.4 0.5 - 2.0 mmol/L  Occult blood gastric / duodenum     Status: Abnormal   Collection Time: 05/07/14  1:00 AM  Result Value Ref Range   Occult Blood, Gastric POSITIVE (A) NEGATIVE    Dg Chest Port 1 View  05/06/2014   CLINICAL DATA:  Vomiting, cough.  EXAM: PORTABLE CHEST - 1 VIEW  COMPARISON:  Chest radiograph obtained yesterday as well as 05/03/2014.  FINDINGS: Tip of the right upper extremity PICC in the mid SVC. Single lead left-sided pacemaker remains in place. Patient is post median sternotomy. Unchanged retrocardiac opacity in left pleural effusion. There is increasing hazy opacity at the right lung base likely combination of pleural effusion and airspace disease. Mild vascular congestion, diminished pulmonary edema. There is stable cardiomegaly. No pneumothorax.  IMPRESSION: Increasing hazy opacity at the right lung base likely combination of pleural effusion and airspace disease, may reflect atelectasis, pneumonia versus aspiration. Unchanged retrocardiac opacity and left pleural effusion.   Electronically Signed   By: Jeb Levering M.D.   On: 05/06/2014 23:17   Dg Chest Port 1 View  05/05/2014   CLINICAL DATA:  PICC line placement.  EXAM: PORTABLE CHEST - 1 VIEW  COMPARISON:  05/03/2014  FINDINGS: The heart is enlarged but stable. The PICC line tip is in the mid SVC at the level of the carina. No complicating features. The right ventricular pacer wires/AICD is stable. Persistent left pleural effusion and left lower lobe atelectasis or infiltrate.  Slight improved  right lung aeration.  IMPRESSION: Right PICC line tip is in the mid SVC.   Electronically Signed   By: Marijo Sanes M.D.   On: 05/05/2014 16:13   Dg Abd Portable 1v  05/06/2014   CLINICAL DATA:  Acute onset of vomiting.  Initial encounter.  EXAM: PORTABLE ABDOMEN - 1 VIEW  COMPARISON:  CT of the abdomen and pelvis performed 01/12/2009  FINDINGS: The visualized bowel gas pattern is unremarkable. Scattered air and stool filled loops of colon are seen; no abnormal dilatation of small bowel loops is seen to suggest small bowel obstruction. Much of the visualized colon is partially distended with air. No free intra-abdominal air is identified, though evaluation for free air is limited on a single supine view.  An aortoiliac stent graft is partially characterized. Coiling is noted at the distal abdominal aorta. Scattered postoperative change is seen at the left lower quadrant.  The visualized osseous structures are within normal limits; the sacroiliac joints are unremarkable in appearance.  IMPRESSION: Unremarkable bowel gas pattern; no free intra-abdominal air seen. Moderate amount of stool noted in the colon. Mild distention of the visualized colon may reflect mild colonic dysmotility.   Electronically Signed   By: Garald Balding M.D.   On: 05/06/2014 23:19    Review of Systems  Constitutional: Negative for fever, chills, weight loss, malaise/fatigue and diaphoresis.  HENT: Negative for congestion, ear discharge, ear pain, hearing loss, nosebleeds and tinnitus.   Eyes: Negative.   Respiratory: Positive for cough, sputum production and shortness of breath.   Cardiovascular: Negative.   Gastrointestinal: Positive for nausea and vomiting. Negative for abdominal pain and diarrhea.  Genitourinary: Negative.   Musculoskeletal: Negative.   Skin: Negative for itching and rash.  Neurological: Negative.  Negative for weakness and headaches.  Psychiatric/Behavioral: Negative.    Blood pressure  109/59, pulse 82, temperature 97.7 F (36.5 C), temperature source Oral, resp. rate 25, height $RemoveBe'6\' 1"'ZlDwnxOvr$  (1.854 m), weight 77.1 kg (169 lb 15.6 oz), SpO2 98 %. Physical Exam  Constitutional: He is oriented to person, place, and time. He appears well-developed.  HENT:  Head: Normocephalic and atraumatic.  Eyes: Conjunctivae are normal. Pupils are equal, round, and reactive to light. Right eye exhibits no discharge. Left eye exhibits no discharge.  Neck: Neck supple.  Cardiovascular:  s1s2 heard.  Respiratory:  Bilateral coarse crepitations.  GI: He exhibits distension.  Bowel sounds heard.  Musculoskeletal: He exhibits no edema or tenderness.  Neurological: He is alert and oriented to person, place, and time.  Skin: Skin is warm. He is not diaphoretic.    Assessment/Plan: #1. Nausea and vomiting - suspect ileus/dysmotility. At this time I have ordered CT abdomen and pelvis to rule out any obstruction. Unfortunately since patient is I'm persistent vomiting patient cannot have contrast only and cannot have IV contrast due to renal failure. I have discussed with on-call cardiologist Dr. Radford Pax who had requested this consult about keeping patient nothing by mouth since patient has been having persistent vomiting. Patient will be kept nothing by mouth and at this time Dr. Radford Pax has advised that until we get more knowledge about patient's CT findings patient can be off Apixaban. #2. Aspiration - chest x-ray shows worsening consolidation. I have placed patient on Zosyn for now. #3. Chronic ischemic cardiomyopathy with last EF measured 20-25% - on milrinone and Lasix infusion. Closely follow intake and output and metabolic panel and daily weights. #4. Persistent atrial fibrillation - on amiodarone infusion. At this time patient is  nothing by mouth and I have discussed with cardiologist Dr. Radford Pax about Apixaban. Based on patient's CT findings. I have further recommendations on patient's  anticoagulation. #5. Acute on chronic kidney disease - closely follow metabolic panel. #6. Anemia and thrombocytopenia - follow CBC.  Thanks for involving [place] in patient's care. We'll follow along with you.  Harmoney Sienkiewicz N. 05/07/2014, 2:14 AM

## 2014-05-07 NOTE — Progress Notes (Signed)
eLink Physician-Brief Progress Note Patient Name: [name] DOB: May 27, 1926 MRN: [id number]   Date of Service  05/07/2014  HPI/Events of Note  Camera in : distress, DNR D/w valerie daughter; wish full comfort care  eICU Interventions  PLAN: [facility] [employer] [facility] with cards, then start morphine drip for comfort, then when comfortable then dc  Pressors with fam in room then dc o2 to nasal cannula     Intervention Category Major Interventions: End of life / care limitation discussion  Nelda BucksFEINSTEIN,DANIEL J. 05/07/2014, 4:26 PM

## 2014-05-07 NOTE — Progress Notes (Signed)
[facility] PROGRESS NOTE/Consult  [name] BoyerJohn E [name] ZOX:[id number] DOB: Jun 21, 1926 DOA: 10-Aug-2014 PCP: Sonda PrimesAlex Plotnikov, MD  Assessment/Plan: #1 nausea or vomiting likely secondary to dysmotility versus ileus Patient status post NG tube with greater than 3000 cc output since 4 AM per nursing. Patient with no further nausea vomiting. Patient states some clinical improvement. CT abdomen and pelvis which was done with findings consistent with gastroparesis. AAA measuring 5.9 x 7.3 cm. Cholelithiasis without CT evidence of cholecystitis. Bilateral pleural effusions with adjacent compressive atelectasis in both lower lobes. Whole body wall edema consistent with CHF and third spacing. Questionable bowel wall thickening the sigmoid colon adjacent to enteric chain sutures. Patient currently on IV Reglan. Keep NG tube in place. Follow. PCCM ff.  #2 probable aspiration pneumonia Noted per chest x-ray. Patient also with nausea vomiting. Patient with low-grade temp of 100.1. Lactic acid was at 1.4. Continue empiric IV Zosyn.  #3 acute on chronic kidney disease Likely secondary to diureses. Follow.  #4 persistent atrial fibrillation Patient currently on amiodarone drip. As it was on hold secondary to coffee-ground emesis yesterday and nothing by mouth. Hemoglobin currently stable. Patient to possibly be started on IV heparin if no further bleeding per primary team.  #5 coronary artery disease status post CABG Per primary team.  #6 aortic stenosis Per primary team.  #7 V. tach She would run of V. tach on 05/05/2014. Patient currently on amiodarone. Currently stable. Per primary team.  #8 hypotension Diuretics have been discontinued. Patient currently on Levophed per PCCM.   #9 acute on chronic systolic heart failure/ischemic cardiomyopathy Echo of 04/2014 with a EF of 20-25% with moderate MR and severe AS. Patient with poor diuresis with worsening renal function and now hypotension making diureses  difficult. On 05/04/2014 patient was started on dobutamine due to low output heart failure and subsequently transitioned to milrinone on 05/05/2014 and Lasix drip. Lasix on hold secondary to hypotension. Coreg and digitoxin also on hold. Per primary team.  #10 anemia/thrombocytopenia Stable. Follow.  [facility] Patient currently on IV Zosyn and on pressors. Cardiac issues are being managed by cardiology.  Will sign off please call with questions.   Code Status: DO NOT INTUBATE Family Communication: Updated patient and son-in-law at bedside. Disposition Plan: Remaining step down unit, per primary team.   Consultants:  PCCM pending  Procedures:  CT abdomen and pelvis 05/07/2014  Chest x-ray 026-Jun-2016, 05/03/2014, 05/05/2014, 05/06/2014, 05/07/2014  Abdominal x-ray 05/06/2014  2-D echo 05/02/2014  Antibiotics:  IV Zosyn 05/07/2014  HPI/Subjective: States feeling better. NGT in place. Per nursing approximately 3000 mL from NG tube empty this morning since 4 AM. Patient denies any chest pain. Patient denies any abdominal pain. Patient with some shortness of breath.  Objective: Filed Vitals:   05/07/14 0800  BP: 81/53  Pulse: 102  Temp: 97.6 F (36.4 C)  Resp: 37    Intake/Output Summary (Last 24 hours) at 05/07/14 0901 Last data filed at 05/07/14 0800  Gross per 24 hour  Intake 1483.4 ml  Output   5075 ml  Net -3591.6 ml   Filed Weights   05/05/14 0420 05/06/14 0300 05/07/14 0500  Weight: 77.1 kg (169 lb 15.6 oz) 77.1 kg (169 lb 15.6 oz) 73.2 kg (161 lb 6 oz)    Exam:   General:  NAD  Cardiovascular: RRR  Respiratory: Coarse BS anterior lung fields  Abdomen: Soft/NT/ND/hypoactive BS  Musculoskeletal: No c/c. 2-3+ BLE edema   Data Reviewed: Basic  Metabolic Panel:  Recent Labs Lab May 19, 2014 1459  05/05/14 2358 05/06/14 0508 05/06/14 1400 05/06/14 2300 05/07/14 0522  NA  --   < > 135 133* 138 138 138  K  --   <  > 3.1* 3.0* 3.2* 3.2* 3.9  CL  --   < > 94* 91* 92* 88* 91*  CO2  --   < > 33* 33* 34* 38* 44*  GLUCOSE  --   < > 167* 268* 165* 269* 211*  BUN  --   < > 85* 77* 71* 67* 68*  CREATININE  --   < > 2.27* 2.17* 2.02* 2.06* 2.24*  CALCIUM  --   < > 8.7 8.4 9.2 8.9 8.8  MG 2.6*  --  2.5  --  2.7*  --   --   < > = values in this interval not displayed. Liver Function Tests:  Recent Labs Lab May 19, 2014 0959 05/05/14 0345 05/06/14 0508 05/07/14 0522  AST 84* 417* 188* 136*  ALT 57* 475* 311* 242*  ALKPHOS 124* 104 97 96  BILITOT 3.7* 2.8* 2.5* 3.0*  PROT 8.0 6.8 6.4 6.2  ALBUMIN 3.7 2.9* 2.7* 2.4*   No results for input(s): LIPASE, AMYLASE in the last 168 hours.  Recent Labs Lab 05/05/14 0345  AMMONIA 28   CBC:  Recent Labs Lab 05/19/14 0959 05/03/14 0454 05/06/14 0508 05/06/14 2300 05/07/14 0522  WBC 8.6 8.4 8.0 10.3 4.9  NEUTROABS 7.0  --   --   --   --   HGB 11.9* 11.1* 10.1* 10.9* 11.8*  HCT 36.5* 33.9* 30.8* 33.6* 37.0*  MCV 105.8* 106.3* 105.5* 105.7* 107.2*  PLT 113* 82* 60* 66* 71*   Cardiac Enzymes:  Recent Labs Lab May 19, 2014 1459 05/19/2014 1830 05/02/14 0110 05/02/14 0440  TROPONINI 0.12* 0.12* 0.15* 0.16*   BNP (last 3 results)  Recent Labs  2014/05/19 1459 05/03/14 0459  BNP 3623.9* 2081.1*    ProBNP (last 3 results)  Recent Labs  12/09/13 1053 03/13/14 1045 04/11/14 1453  PROBNP 653.0* 1237.0* 1764.0*    CBG:  Recent Labs Lab 05/06/14 1146 05/06/14 1618 05/06/14 2123 05/07/14 0825  GLUCAP 157* 143* 162* 116*    Recent Results (from the past 240 hour(s))  Urine culture     Status: None   Collection Time: May 19, 2014  4:24 PM  Result Value Ref Range Status   Specimen Description URINE, RANDOM  Final   Special Requests NONE  Final   Colony Count   Final    70,000 COLONIES/ML Performed at [facility]    Culture   Final    Multiple bacterial morphotypes present, none predominant. Suggest appropriate recollection if  clinically indicated. Performed at [facility]    Report Status 05/03/2014 FINAL  Final  MRSA PCR Screening     Status: None   Collection Time: 05/03/14  5:31 PM  Result Value Ref Range Status   MRSA by PCR NEGATIVE NEGATIVE Final    Comment:        The GeneXpert MRSA Assay (FDA approved for NASAL specimens only), is one component of a comprehensive MRSA colonization surveillance program. It is not intended to diagnose MRSA infection nor to guide or monitor treatment for MRSA infections.      Studies: Ct Abdomen Pelvis Wo Contrast  05/07/2014   CLINICAL DATA:  81 year old male with nausea and vomiting.  EXAM: CT ABDOMEN AND PELVIS WITHOUT CONTRAST  TECHNIQUE: Multidetector CT imaging of the abdomen and pelvis was  performed following the standard protocol without IV contrast.  COMPARISON:  Abdominal radiograph 1 day prior.  CT 01/12/2009  FINDINGS: Small to moderate bilateral pleural effusions and adjacent compressive atelectasis. Pacemaker wires are partially included.  There is an enteric tube with tip in the stomach. Despite enteric tube placement, the stomach remains mildly distended with fluid. The duodenum appears distended with fluid. A proximal small bowel loops are obscured. There are no dilated small bowel loops to suggest obstruction. Portions of the abdomen are obscured by dense streak artifact from coils. Small to moderate stool in the right colon. Enteric chain sutures are noted at the sigmoid colon. Equivocal bowel wall thickening adjacent to the enteric sutures. The majority of the colon is decompressed.  Detailed evaluation is limited given lack of contrast and patient motion artifact. Calcified gallstones within physiologically distended gallbladder. The unenhanced liver is unremarkable. Small splenic granulomas. Distal pancreas is normal, proximal mid pancreas are obscured. There is no hydronephrosis. Mild thinning of both renal parenchyma.  Patient is post abdominal  aortic aneurysm repair. The aneurysm sac has increased from prior exam currently measuring 5.9 x 7.3 cm, there are adjacent coils from presumed endoleak repair. The coils cause significant surrounding streak artifact. No definite periaortic soft tissue stranding.  No free intra-abdominal air. Small amount of free fluid in the left pericolic gutter. There is whole body wall edema consistent with third spacing.  Within the pelvis the urinary bladder is decompressed by Foley catheter. Prostate gland appears prominent in size.  Minimal compression deformity superior endplate of L2, age indeterminate but new from prior exam. Scattered bone islands are seen.  IMPRESSION: 1. Fluid distending the stomach and proximal duodenum, with an enteric tube in place. The remaining bowel loops are decompressed. This can be seen in the setting of gastroparesis. 2. Abdominal aortic aneurysm with coiling of presumed endoleak. The aneurysm sac has increased from exam 6 years prior, no interval exams to evaluate for comparison. Currently the aneurysm sac measures 5.9 x 7.3 cm. No definite surrounding soft tissue stranding, however streak artifact from coils partially limits evaluation. 3. Cholelithiasis without CT findings of cholecystitis. 4. Bilateral pleural effusions with adjacent compressive atelectasis in both lower lobes. Whole body wall edema and small amount of intra-abdominal free fluid. Constellation of findings consistent with congestive heart failure and third spacing. 5. Questionable bowel wall thickening in the sigmoid colon adjacent to enteric chain sutures. This simply could be related to nondistention, short segment of bowel inflammation is considered.   Electronically Signed   By: Rubye Oaks M.D.   On: 05/07/2014 05:11   Dg Chest Port 1 View  05/06/2014   CLINICAL DATA:  Vomiting, cough.  EXAM: PORTABLE CHEST - 1 VIEW  COMPARISON:  Chest radiograph obtained yesterday as well as 05/03/2014.  FINDINGS: Tip of the  right upper extremity PICC in the mid SVC. Single lead left-sided pacemaker remains in place. Patient is post median sternotomy. Unchanged retrocardiac opacity in left pleural effusion. There is increasing hazy opacity at the right lung base likely combination of pleural effusion and airspace disease. Mild vascular congestion, diminished pulmonary edema. There is stable cardiomegaly. No pneumothorax.  IMPRESSION: Increasing hazy opacity at the right lung base likely combination of pleural effusion and airspace disease, may reflect atelectasis, pneumonia versus aspiration. Unchanged retrocardiac opacity and left pleural effusion.   Electronically Signed   By: Rubye Oaks M.D.   On: 05/06/2014 23:17   Dg Chest Port 1 View  05/05/2014   CLINICAL  DATA:  PICC line placement.  EXAM: PORTABLE CHEST - 1 VIEW  COMPARISON:  05/03/2014  FINDINGS: The heart is enlarged but stable. The PICC line tip is in the mid SVC at the level of the carina. No complicating features. The right ventricular pacer wires/AICD is stable. Persistent left pleural effusion and left lower lobe atelectasis or infiltrate. Slight improved right lung aeration.  IMPRESSION: Right PICC line tip is in the mid SVC.   Electronically Signed   By: Rudie Meyer M.D.   On: 05/05/2014 16:13   Dg Abd Portable 1v  05/06/2014   CLINICAL DATA:  Acute onset of vomiting.  Initial encounter.  EXAM: PORTABLE ABDOMEN - 1 VIEW  COMPARISON:  CT of the abdomen and pelvis performed 01/12/2009  FINDINGS: The visualized bowel gas pattern is unremarkable. Scattered air and stool filled loops of colon are seen; no abnormal dilatation of small bowel loops is seen to suggest small bowel obstruction. Much of the visualized colon is partially distended with air. No free intra-abdominal air is identified, though evaluation for free air is limited on a single supine view.  An aortoiliac stent graft is partially characterized. Coiling is noted at the distal abdominal aorta.  Scattered postoperative change is seen at the left lower quadrant.  The visualized osseous structures are within normal limits; the sacroiliac joints are unremarkable in appearance.  IMPRESSION: Unremarkable bowel gas pattern; no free intra-abdominal air seen. Moderate amount of stool noted in the colon. Mild distention of the visualized colon may reflect mild colonic dysmotility.   Electronically Signed   By: Roanna Raider M.D.   On: 05/06/2014 23:19    Scheduled Meds: . acetaminophen  650 mg Oral q morning - 10a  . amiodarone  150 mg Intravenous Once  . antiseptic oral rinse  7 mL Mouth Rinse BID  . insulin aspart  0-5 Units Subcutaneous QHS  . insulin aspart  0-9 Units Subcutaneous TID WC  . metoCLOPramide (REGLAN) injection  10 mg Intravenous Once  . metoCLOPramide (REGLAN) injection  5 mg Intravenous 4 times per day  . pantoprazole (PROTONIX) IV  40 mg Intravenous Q12H  . piperacillin-tazobactam (ZOSYN)  IV  3.375 g Intravenous 3 times per day  . promethazine  12.5 mg Intravenous Once  . sodium chloride  1 spray Each Nare Daily  . sodium chloride  250 mL Intravenous Once  . sodium chloride  10-40 mL Intracatheter Q12H   Continuous Infusions: . amiodarone 30 mg/hr (05/07/14 0413)  . milrinone 0.375 mcg/kg/min (05/06/14 2332)  . norepinephrine (LEVOPHED) Adult infusion      Active Problems:   Acute on chronic systolic CHF (congestive heart failure)   Weakness   Acute on chronic renal failure   Persistent atrial fibrillation   Thrombocytopenia   Acute renal failure superimposed on stage 3 chronic kidney disease   Aortic stenosis   HX: anticoagulation   Congestive heart disease   ICD (implantable cardioverter-defibrillator) in place   Protein-calorie malnutrition, severe   Nausea with vomiting   Aspiration pneumonia    Time spent: 40 mins    [facility] MD [facility] Pager [phone]. If 7PM-7AM, please contact night-coverage at [email or web address], password  [facility] 05/07/2014, 9:01 AM  LOS: 6 days

## 2014-05-07 NOTE — Consult Note (Signed)
PULMONARY / CRITICAL CARE MEDICINE   Name: [name] MRN: [id number] DOB: 1926/07/27    ADMISSION DATE:  28-May-2014 CONSULTATION DATE:  3/23  REFERRING MD :  Shirlee Latch   CHIEF COMPLAINT:  Hypoxia, hypotension   INITIAL PRESENTATION: 81yo male with hx CAD s/p CABG, ischemic cardiomyopathy with EF 20-25%, severe AS, Afib, CKD admitted 3/17 by cards with acute on chronic sCHF.  Was being treated with lasix gtt, milrinone gtt and was diuresing well and improving slowly.  On 3/22 developed increased nausea, vomiting, likely ileus and probable aspiration event and TRH consulted for assist.  On am 3/23 was hypotensive with SBP 60's, hypoxic requiring NRB and PCCM consulted.   STUDIES:  2D echo 3/18>>> EF 20-25%, diffuse hypokinesis, severe AS, mild AR, mod MR, mod dilated LA CT abd/pelvis 3/22>>> fluid distending the stomach and proximal duodenum, ?gastroparesis, AAA with coiling increased size, cholelithiasis without cholecystitis, bilat pleural effusions with compressive atx, bowel wall thickening sigmoid colon  SIGNIFICANT EVENTS:   HISTORY OF PRESENT ILLNESS:  81yo male with hx CAD s/p CABG, ischemic cardiomyopathy with EF 20-25%, severe AS, Afib, CKD admitted 3/17 by cards with acute on chronic sCHF.  Was being treated with lasix gtt, milrinone gtt and was diuresing well and improving slowly.  On 3/22 developed increased nausea, vomiting, likely ileus and probable aspiration event and TRH consulted for assist.  On am 3/23 was hypotensive with SBP 60's, hypoxic requiring NRB and PCCM consulted.    PAST MEDICAL HISTORY :   has a past medical history of Atrial fibrillation; Ischemic cardiomyopathy; implantable cardiac defibrillator -single chamber[V45.02]; Abdominal aneurysm without mention of rupture; Chronic systolic heart failure; Hypertension; Peripheral vascular disease, unspecified; Hemoptysis; Acute prostatitis; Labyrinthitis, unspecified; Unspecified hearing loss; Cervicalgia; Stroke; CKD  (chronic kidney disease), stage III; and Chronic anticoagulation.  has past surgical history that includes Insert / replace / remove pacemaker (2006); Colectomy (07/15/96); Colostomy (07/15/96); Coronary artery bypass graft (03/22/89); Cardiac catheterization (2001); Abdominal aortic aneurysm repair (05/1999); Cardioversion; TEE without cardioversion (N/A, 12/12/2013); Cardioversion (N/A, 01/06/2014); implantable cardioverter defibrillator (icd) generator change (N/A, 01/18/2011); and Cardioversion (N/A, 02/10/2014). Prior to Admission medications   Medication Sig Start Date End Date Taking? Authorizing Provider  acetaminophen (TYLENOL) 500 MG tablet Take 1,000 mg by mouth every morning.    Yes Historical Provider, MD  apixaban (ELIQUIS) 2.5 MG TABS tablet Take 1 tablet (2.5 mg total) by mouth 2 (two) times daily. 12/16/13  Yes Duke Salvia, MD  Carboxymethylcellulose Sodium (REFRESH TEARS OP) Place 1-2 drops into both eyes daily as needed (dry eyes).   Yes Historical Provider, MD  carvedilol (COREG) 6.25 MG tablet Take 1 tablet (6.25 mg total) by mouth 2 (two) times daily with a meal. 12/13/13  Yes Rhonda G Barrett, PA-C  Copper Gluconate 2 MG TABS Take 2 mg by mouth daily as needed (joint pain).    Yes Historical Provider, MD  diclofenac sodium (VOLTAREN) 1 % GEL Apply 4 g topically 2 (two) times daily as needed (pain).   Yes Historical Provider, MD  digoxin (LANOXIN) 0.125 MG tablet TAKE 1/2 TABLET ONCE DAILY 04/01/14  Yes Aleksei Plotnikov V, MD  dofetilide (TIKOSYN) 125 MCG capsule Take 125 mcg by mouth 2 (two) times daily.   Yes Historical Provider, MD  losartan (COZAAR) 50 MG tablet Take 1 tablet (50 mg total) by mouth daily. 04/29/14  Yes Aleksei Plotnikov V, MD  Magnesium 250 MG TABS Take 250 mg by mouth every evening.    Yes Historical Provider,  MD  Multiple Vitamin (MULTIVITAMIN) tablet Take 1 tablet by mouth daily.    Yes Historical Provider, MD  nitroGLYCERIN (NITROSTAT) 0.4 MG SL tablet Place 0.4  mg under the tongue every 5 (five) minutes as needed for chest pain.    Yes Historical Provider, MD  Polyethyl Glycol-Propyl Glycol (SYSTANE OP) Place 1-2 drops into both eyes daily as needed (dry eyes).    Yes Historical Provider, MD  RANEXA 500 MG 12 hr tablet TAKE 1 TABLET BY MOUTH TWICE A DAY 04/28/14  Yes Duke SalviaSteven C Klein, MD  sodium chloride (OCEAN) 0.65 % SOLN nasal spray Place 1 spray into both nostrils daily.   Yes Historical Provider, MD  torsemide (DEMADEX) 20 MG tablet Take 1-3 tablets (20-60 mg total) by mouth daily. 04/29/14  Yes Tresa GarterAleksei Plotnikov V, MD   Allergies  Allergen Reactions  . Benazepril Cough  . Promethazine Hcl Other (See Comments)    REACTION: world spins.Marland Kitchen.    FAMILY HISTORY:  indicated that his mother is deceased. He indicated that his father is deceased.  SOCIAL HISTORY:  reports that he quit smoking about 43 years ago. He does not have any smokeless tobacco history on file. He reports that he does not drink alcohol or use illicit drugs.  REVIEW OF SYSTEMS:   Difficult to obtain, pt somnolent.  Obtained per records and d/w Dr. Shirlee LatchMcLean and pt's son in law.   SUBJECTIVE:     VITAL SIGNS: Temp:  [96.9 F (36.1 C)-100.1 F (37.8 C)] 97.6 F (36.4 C) (03/23 0800) Pulse Rate:  [40-113] 102 (03/23 0800) Resp:  [18-41] 37 (03/23 0800) BP: (66-127)/(35-97) 81/53 mmHg (03/23 0800) SpO2:  [76 %-99 %] 96 % (03/23 0800) Weight:  [161 lb 6 oz (73.2 kg)] 161 lb 6 oz (73.2 kg) (03/23 0500) HEMODYNAMICS: CVP:  [4 mmHg-13 mmHg] 5 mmHg VENTILATOR SETTINGS:   INTAKE / OUTPUT:  Intake/Output Summary (Last 24 hours) at 05/07/14 0953 Last data filed at 05/07/14 0914  Gross per 24 hour  Intake 1493.4 ml  Output   5075 ml  Net -3581.6 ml    PHYSICAL EXAMINATION: General:  Frail, chronically ill appearing male, acutely ill appearing  Neuro:  Somnolent, arouses to loud voice, nods appropriately, MAE, gen weakness  HEENT:  Mm dry, no JVD  Cardiovascular:  s1s2  irreg Lungs:  resps even, mildly labored on NRB, diminished L, coarse R Abdomen:  Soft, non distended, -bs Musculoskeletal:  Pale, dry, 2+ BLE edema   LABS:  CBC  Recent Labs Lab 05/06/14 0508 05/06/14 2300 05/07/14 0522  WBC 8.0 10.3 4.9  HGB 10.1* 10.9* 11.8*  HCT 30.8* 33.6* 37.0*  PLT 60* 66* 71*   Coag's No results for input(s): APTT, INR in the last 168 hours. BMET  Recent Labs Lab 05/06/14 1400 05/06/14 2300 05/07/14 0522  NA 138 138 138  K 3.2* 3.2* 3.9  CL 92* 88* 91*  CO2 34* 38* 44*  BUN 71* 67* 68*  CREATININE 2.02* 2.06* 2.24*  GLUCOSE 165* 269* 211*   Electrolytes  Recent Labs Lab 2014/05/17 1459  05/05/14 2358  05/06/14 1400 05/06/14 2300 05/07/14 0522  CALCIUM  --   < > 8.7  < > 9.2 8.9 8.8  MG 2.6*  --  2.5  --  2.7*  --   --   < > = values in this interval not displayed. Sepsis Markers  Recent Labs Lab 05/06/14 2300  LATICACIDVEN 1.4   ABG  Recent Labs Lab 05/07/14 0910  PHART 7.527*  PCO2ART 43.9  PO2ART 51.2*   Liver Enzymes  Recent Labs Lab 05/05/14 0345 05/06/14 0508 05/07/14 0522  AST 417* 188* 136*  ALT 475* 311* 242*  ALKPHOS 104 97 96  BILITOT 2.8* 2.5* 3.0*  ALBUMIN 2.9* 2.7* 2.4*   Cardiac Enzymes  Recent Labs Lab 04/24/2014 1830 05/02/14 0110 05/02/14 0440  TROPONINI 0.12* 0.15* 0.16*   Glucose  Recent Labs Lab 05/06/14 1146 05/06/14 1618 05/06/14 2123 05/07/14 0825  GLUCAP 157* 143* 162* 116*    Imaging Dg Chest Port 1 View  05/06/2014   CLINICAL DATA:  Vomiting, cough.  EXAM: PORTABLE CHEST - 1 VIEW  COMPARISON:  Chest radiograph obtained yesterday as well as 05/03/2014.  FINDINGS: Tip of the right upper extremity PICC in the mid SVC. Single lead left-sided pacemaker remains in place. Patient is post median sternotomy. Unchanged retrocardiac opacity in left pleural effusion. There is increasing hazy opacity at the right lung base likely combination of pleural effusion and airspace disease.  Mild vascular congestion, diminished pulmonary edema. There is stable cardiomegaly. No pneumothorax.  IMPRESSION: Increasing hazy opacity at the right lung base likely combination of pleural effusion and airspace disease, may reflect atelectasis, pneumonia versus aspiration. Unchanged retrocardiac opacity and left pleural effusion.   Electronically Signed   By: Rubye Oaks M.D.   On: 05/06/2014 23:17   Dg Abd Portable 1v  05/06/2014   CLINICAL DATA:  Acute onset of vomiting.  Initial encounter.  EXAM: PORTABLE ABDOMEN - 1 VIEW  COMPARISON:  CT of the abdomen and pelvis performed 01/12/2009  FINDINGS: The visualized bowel gas pattern is unremarkable. Scattered air and stool filled loops of colon are seen; no abnormal dilatation of small bowel loops is seen to suggest small bowel obstruction. Much of the visualized colon is partially distended with air. No free intra-abdominal air is identified, though evaluation for free air is limited on a single supine view.  An aortoiliac stent graft is partially characterized. Coiling is noted at the distal abdominal aorta. Scattered postoperative change is seen at the left lower quadrant.  The visualized osseous structures are within normal limits; the sacroiliac joints are unremarkable in appearance.  IMPRESSION: Unremarkable bowel gas pattern; no free intra-abdominal air seen. Moderate amount of stool noted in the colon. Mild distention of the visualized colon may reflect mild colonic dysmotility.   Electronically Signed   By: Roanna Raider M.D.   On: 05/06/2014 23:19     ASSESSMENT / PLAN:  PULMONARY Acute hypoxic respiratory failure  Aspiration PNA  Acute on chronic sCHF  Pulmonary edema  P:   Cont supplemental O2 - wean off NRB as able  No further ABG  F/u CXR  pulm hygiene as able  Empiric abx for HCAP v aspiration PNA  Ultimately needs further volume removal as able - on hold for now with hypotension  DNI -- See discussion below     CARDIOVASCULAR RUE PICC >>> Ischemic cardiomyopathy - EF 20-25%  Severe AS, mod MR  Acute on chronic sCHF  CAD s/p CABG  Afib - failed Tikosyn  Shock - septic v hypovolemic r/t diuresis/intra-vascular volume depletion  P:  Cards primary  Cont milrinone  Hold lasix  CVP 3-4, co-ox 61% Gentle 250cc bolus x 1 given per cards  Coreg, dig on hold  Cont eliquis  Levophed gtt    RENAL Acute on CKD - baseline Scr ~1.5 Cardiorenal  Metabolic alkalosis/ contraction alkalosis - in setting aggressive diuresis  P:  Hold lasix  F/u chem    GASTROINTESTINAL Nausea, vomiting  Ileus - abd soft P:   NG tube to suction - 2L out initially  NPO  Cont reglan for now  F/u LFT's  PPI  HEMATOLOGIC Thrombocytopenia  P:  Cont eliquis  F/u cbc   INFECTIOUS ?aspiration PNA v HCAP  P:   Vancomycin 3/23>>> Zosyn 3/23>>>  Empiric abx  Check lactate, pct   ENDOCRINE Hyperglycemia - no reported hx DM  P:   SSI   NEUROLOGIC AMS - r/t hypotension, SIRS  Discomfort of critical illness P:   Low dose morphine as needed for refractory dyspnea If symptoms are not well controlled with low dose intermittent morphine, family is ready to transition to morphine infusion and full comfort care   FAMILY  - Updates:  Son in Social worker and duaghter updated at bedside 3/23 - see below   Discussed with son in law, Dr. Shirlee Latch and pt's daughter (via phone).  Pt has been clear in the past that he would not want to "go on the ventilator".  He is critically ill this am with rapidly worsening respiratory failure and shock.  Daughter en route.  They do wish for pt to be DNI but are not ready to make a full DNR despite our discussions regarding how intubation/CPR/defib in the event of arrest are related.  Will continue full aggressive medical care for now with the exception of intubation.  However, he continues to deteriorate and further discussions will need to occur with daughter when she arrives.  If  patient continues to deteriorate the best plan in this situation may be to move towards comfort.    Dirk Dress, NP 05/07/2014  9:53 AM Pager: [phone] or [phone]    PCCM ATTENDING: I have reviewed pt's initial presentation, consultants notes and hospital database in detail.  The above assessment and plan was formulated under my direction.  In summary: ES cardiomyopathy with cardiogenic shock and pulmonary edema Probable superimposed aspiration PNA (worsening infiltrates, emesis last PM) Severe hypoxic respiratory failure due to above DNR/DNI well established in past conversations btw pt and his famil. Living Will previously executed Per his daughter, he was increasingly frustrated with his declining QOL He would not likely survive a hospitalization that entailed intubation and certainly would have further decrement in his QOL if he did survive  Abx as above Cont high flow O2 Mgmt of HF per Cards Low dose morphine to "take the edge off" if dyspnea is unacceptably severe Family is ready to transition to morphine gtt if symptoms worsen Our highest priority should be his comfort   Billy Fischer, MD;  PCCM service; Mobile [phone]

## 2014-05-07 NOTE — Progress Notes (Signed)
Patient has been sitting up in bed all morning, he is alert but drowsy, answers questions appropriately.  He has been on NRB 15 L since beginning of shift, NG tube in place to LIS with minimal output.  Son in law has been at bedside most of the morning as well.  Have updated them on plan of care and they have spoken with multiple doctors.  Limited Ice chips this morning and son in law is aware of that.  Minimal dark urine output and physicians aware of that as well.    Approached in hallway by Vikki PortsValerie (daughter) wanted to know why I would not give ice chips/water to her father, explained to her about NPO status and had been giving limited ice through out am.  She stated that if her father was not going to pull out of this she wanted to give him anything he wanted.  Explained the risk of constant water/chips that could cause further aspiration, issues involving stomach being worsened.  She acknowledges that she understands the risk involved but at this point wants to do everything to make him comfortable.  She is currently giving him ice chips when ever he requests.

## 2014-05-07 NOTE — Progress Notes (Signed)
OT Cancellation Note  Patient Details Name: [name] BoyerJohn E [name] MRN: [id number] DOB: 04/21/1926   Cancelled Treatment:    Reason Eval/Treat Not Completed: Medical issues which prohibited therapy. Pt with decline in medical status. Signing off.  Evern BioMayberry, [name] 05/07/2014, 2:29 PM

## 2014-05-07 NOTE — Progress Notes (Addendum)
ANTIBIOTIC CONSULT NOTE - INITIAL  Pharmacy Consult for Zosyn Indication: aspiration pneumonia  Allergies  Allergen Reactions  . Benazepril Cough  . Promethazine Hcl Other (See Comments)    REACTION: world spins..    Patient Measurements: Height:  (185.4 cm) Weight: 169 lb 15.6 oz (77.1 kg) IBW/kg (Calculated) : 79.9   Vital Signs: Temp: 97.7 F (36.5 C) (03/22 2357) Temp Source: Oral (03/22 2357) BP: 109/59 mmHg (03/22 1800) Pulse Rate: 82 (03/22 1800) Intake/Output from previous day: 03/22 0701 - 03/23 0700 In: 1372.5 [P.O.:840; I.V.:432.5; IV Piggyback:100] Out: 1350 [Urine:1350] Intake/Output from this shift:    Labs:  Recent Labs  05/06/14 0508 05/06/14 1400 05/06/14 2300  WBC 8.0  --  10.3  HGB 10.1*  --  10.9*  PLT 60*  --  66*  CREATININE 2.17* 2.02* 2.06*   Estimated Creatinine Clearance: 28.1 mL/min (by C-G formula based on Cr of 2.06). No results for input(s): VANCOTROUGH, VANCOPEAK, VANCORANDOM, GENTTROUGH, GENTPEAK, GENTRANDOM, TOBRATROUGH, TOBRAPEAK, TOBRARND, AMIKACINPEAK, AMIKACINTROU, AMIKACIN in the last 72 hours.   Microbiology: Recent Results (from the past 720 hour(s))  CULTURE, URINE COMPREHENSIVE     Status: None   Collection Time: 04/11/14  2:53 PM  Result Value Ref Range Status   Colony Count NO GROWTH  Final   Organism ID, Bacteria NO GROWTH  Final  Urine culture     Status: None   Collection Time: 05-May-2014  4:24 PM  Result Value Ref Range Status   Specimen Description URINE, RANDOM  Final   Special Requests NONE  Final   Colony Count   Final    70,000 COLONIES/ML Performed at [facility]    Culture   Final    Multiple bacterial morphotypes present, none predominant. Suggest appropriate recollection if clinically indicated. Performed at [facility]    Report Status 05/03/2014 FINAL  Final  MRSA PCR Screening     Status: None   Collection Time: 05/03/14  5:31 PM  Result Value Ref Range Status   MRSA  by PCR NEGATIVE NEGATIVE Final    Comment:        The GeneXpert MRSA Assay (FDA approved for NASAL specimens only), is one component of a comprehensive MRSA colonization surveillance program. It is not intended to diagnose MRSA infection nor to guide or monitor treatment for MRSA infections.     Medical History: Past Medical History  Diagnosis Date  . Atrial fibrillation     AFIB  . Ischemic cardiomyopathy     CABG in 1991; last catheter 2005 with patent vein graft to PD, diagonal, marginal/echo 2010 EF 25%.  . implantable cardiac defibrillator -single chamber[V45.02]     Environmental manager  . Abdominal aneurysm without mention of rupture     s/p stent graft with endoleak requiring restenting-[facility]  . Chronic systolic heart failure     SYSTOLIC .Marland KitchenACUTE ON CHRONIC  . Hypertension   . Peripheral vascular disease, unspecified   . Hemoptysis   . Acute prostatitis   . Labyrinthitis, unspecified   . Unspecified hearing loss   . Cervicalgia   . Stroke   . CKD (chronic kidney disease), stage III   . Chronic anticoagulation     Coumadin    Medications:  Scheduled:  . acetaminophen  650 mg Oral q morning - 10a  . amiodarone  150 mg Intravenous Once  . antiseptic oral rinse  7 mL Mouth Rinse BID  . apixaban  2.5 mg Oral BID  . carvedilol  3.125 mg Oral BID WC  . insulin aspart  0-5 Units Subcutaneous QHS  . insulin aspart  0-9 Units Subcutaneous TID WC  . multivitamin with minerals  1 tablet Oral Daily  . piperacillin-tazobactam (ZOSYN)  IV  3.375 g Intravenous 3 times per day  . potassium chloride  10 mEq Intravenous Q1 Hr x 4  . senna-docusate  1 tablet Oral QHS  . sodium chloride  1 spray Each Nare Daily  . sodium chloride  10-40 mL Intracatheter Q12H   Assessment: 81 y.o male with history of CAD s/p CABG, ischemic cardiomyopathy EF 20-25% on last echo, persistent atrial fibrillation failed Tikosyn, and CKD presented with weakness, acute on chronic systolic CHF.  Now to  start on IV Zosyn for aspiration pneumonia.  SCr = 2.06, estimated CrCl ~ 28 ml/min.   Goal of Therapy:  Vancomycin trough level 15-20 mcg/ml  Plan:  Zosyn 3.375 gm IV q8hr (infuse each dose over 4 hours).  Monitor renal function and adjust dose if needed.  Noah Delaineuth Clark, RPh Clinical Pharmacist Pager: [phone] 05/07/2014,2:25 AM   Addendum Seen by CCM - add vancomycin for possible HCAP Cr 2.24 CrCl 7225ml/min 73kg  Vancomycin 1gm x1 then 750mg  q24  Leota SauersLisa Jeanett Antonopoulos Pharm.D. CPP, BCPS Clinical Pharmacist [phone] 05/07/2014 10:38 AM

## 2014-05-08 DIAGNOSIS — J9601 Acute respiratory failure with hypoxia: Secondary | ICD-10-CM

## 2014-05-08 MED ORDER — GLYCOPYRROLATE 0.2 MG/ML IJ SOLN
0.2000 mg | INTRAMUSCULAR | Status: DC | PRN
  Filled 2014-05-08 (×2): qty 1

## 2014-05-12 ENCOUNTER — Telehealth: Payer: Self-pay | Admitting: Cardiology

## 2014-05-12 ENCOUNTER — Telehealth: Payer: Self-pay | Admitting: Family Medicine

## 2014-05-12 NOTE — Telephone Encounter (Signed)
Dr. Dina RichJonathan Branch with [place] office has agreed to sign d/c, called [facility]  [phone] she is aware of where to send d/c.

## 2014-05-12 NOTE — Telephone Encounter (Signed)
Pat with [facility] called back asking where Dr. Dina RichJonathan Branch, MD is located I told her [place], [place] and he agreed to sign the d/c  Dennie Bibleat stated to me that she had to many other things to handle this week and to drive to [place] was out of the way to get a d/c signed.Dennie Bibleat stated she will  Call [facility] and see what can be done.  .Marland Kitchen

## 2014-05-12 NOTE — Telephone Encounter (Signed)
Dr.Jonathan Branch W/ [place] Office has agreed to sign the d/c for this pt, called spoke with Waynet[facility]Beth at [facility] [phone] She is aware of where to send d/c.

## 2014-05-15 ENCOUNTER — Ambulatory Visit: Payer: Medicare Other | Admitting: Internal Medicine

## 2014-05-16 NOTE — Progress Notes (Signed)
Contacted by nursing staff that patient had expired. Time of death 1836 pm. Patient was DNR/DNI, comfort care only. From nursing report family was at bedside at time of passing. MD exam confirms no pulse, no heart sounds or spontaneous breath sounds, no corneal reflex or gag reflex. Nursing attempted to contact primary MD Dr Shirlee LatchMcLean however not able to get in touch, he will be made aware in AM   [name] FerryJ [name] Fiorenza MD

## 2014-05-16 NOTE — Progress Notes (Signed)
Paged dr Shirlee LatchMcLean, no response, paged  Dr Wyline MoodBranch returned page and is coming to pronounce patient.

## 2014-05-16 NOTE — Progress Notes (Signed)
Patient on full comfort care. He is having prolonged periods of apnea. Currently on morphine infusion at 12.5 mg/hr. He is comfortable. NG remains ion setting of known SBO and abdominal distention. Oxygen weaned down to 2L [place]. Patient has survived longer than expected, family anxious, anticipatory anxiety. In hospital death expected in next 12-24 hours. Advised nursing to discontinue [place] O2 as it is not contributing to patient's comfort at this point.May need to provide family education. Verified ICD has been deactivated. Given patient's current condition he would be too high risk to transfer to another floor and may die in transition-this would be distressing to his family and interupt much needed nursing continuity.  Palliative team is following closely.  Anderson MaltaElizabeth Nazariah Cadet, DO Palliative Medicine [phone]

## 2014-05-16 NOTE — Progress Notes (Signed)
patient expired @ 1836, confirmed with nurse Wilkie AyeKristy, family at bedside all personal belongings gathered and taken with them, paged triad hospitalist no response, they called back now and stated to follow up with cardiology. [facility] notified, ref# in chart.  Notified funeral home per family request.  240 ml morphine wasted in sink with Morrin Engineer, manufacturing systems(charge nurse) after passing.

## 2014-05-16 NOTE — Progress Notes (Signed)
Chaplain responded to spiritual care consult for end of life submitted by Dr. Tyson AliasFeinstein. Spoke with pt's son and daughter at the door. They mentioned that pt's pastor had been there earlier this evening. They expressed appreciation for my concern but did not appear to want further conversation. I assured them of my availability if needed.

## 2014-05-16 NOTE — Progress Notes (Signed)
Patient ID: [name], male   DOB: 1926-09-09, 81 y.o.   MRN: [id number]   SUBJECTIVE: [name] is now DNR/DNI, comfort measures only.  Off pressors/milrinone, SBP in 60s all night.  He is on morphine gtt, not responsive.    Scheduled Meds: . acetaminophen  650 mg Oral q morning - 10a  . antiseptic oral rinse  7 mL Mouth Rinse BID   Continuous Infusions: . morphine 10 mg/hr (05/07/14 2033)   PRN Meds:.acetaminophen, LORazepam, morphine injection, morphine, ondansetron (ZOFRAN) IV    Filed Vitals:   June 06, 2014 0430 Jun 06, 2014 0500 06-06-14 0530 June 06, 2014 0600  BP: 73/35  Pulse: 95 96 97 94  Temp:      TempSrc:      Resp: Height:      Weight:      SpO2: 94% 91% 94% 95%    Intake/Output Summary (Last 24 hours) at 06/06/14 0748 Last data filed at 06-06-2014 0600  Gross per 24 hour  Intake 1239.3 ml  Output    750 ml  Net  489.3 ml    LABS: Basic Metabolic Panel:  Recent Labs  40/98/11 2358  05/06/14 1400 05/06/14 2300 05/07/14 0522  NA 135  < > 138 138 138  K 3.1*  < > 3.2* 3.2* 3.9  CL 94*  < > 92* 88* 91*  CO2 33*  < > 34* 38* 44*  GLUCOSE 167*  < > 165* 269* 211*  BUN 85*  < > 71* 67* 68*  CREATININE 2.27*  < > 2.02* 2.06* 2.24*  CALCIUM 8.7  < > 9.2 8.9 8.8  MG 2.5  --  2.7*  --   --   < > = values in this interval not displayed. Liver Function Tests:  Recent Labs  05/06/14 0508 05/07/14 0522  AST 188* 136*  ALT 311* 242*  ALKPHOS 97 96  BILITOT 2.5* 3.0*  PROT 6.4 6.2  ALBUMIN 2.7* 2.4*   No results for input(s): LIPASE, AMYLASE in the last 72 hours. CBC:  Recent Labs  05/06/14 2300 05/07/14 0522  WBC 10.3 4.9  HGB 10.9* 11.8*  HCT 33.6* 37.0*  MCV 105.7* 107.2*  PLT 66* 71*   Cardiac Enzymes: No results for input(s): CKTOTAL, CKMB, CKMBINDEX, TROPONINI in the last 72 hours. BNP: Invalid input(s): POCBNP D-Dimer: No results for input(s): DDIMER in the last 72 hours. Hemoglobin A1C: No results for input(s):  HGBA1C in the last 72 hours. Fasting Lipid Panel: No results for input(s): CHOL, HDL, LDLCALC, TRIG, CHOLHDL, LDLDIRECT in the last 72 hours. Thyroid Function Tests: No results for input(s): TSH, T4TOTAL, T3FREE, THYROIDAB in the last 72 hours.  Invalid input(s): FREET3 Anemia Panel: No results for input(s): VITAMINB12, FOLATE, FERRITIN, TIBC, IRON, RETICCTPCT in the last 72 hours.  RADIOLOGY: Ct Abdomen Pelvis Wo Contrast  05/07/2014   CLINICAL DATA:  81 year old male with nausea and vomiting.  EXAM: CT ABDOMEN AND PELVIS WITHOUT CONTRAST  TECHNIQUE: Multidetector CT imaging of the abdomen and pelvis was performed following the standard protocol without IV contrast.  COMPARISON:  Abdominal radiograph 1 day prior.  CT 01/12/2009  FINDINGS: Small to moderate bilateral pleural effusions and adjacent compressive atelectasis. Pacemaker wires are partially included.  There is an enteric tube with tip in the stomach. Despite enteric tube placement, the stomach remains mildly distended with fluid. The duodenum appears distended with fluid. A proximal small bowel loops are obscured. There are no dilated small bowel loops  to suggest obstruction. Portions of the abdomen are obscured by dense streak artifact from coils. Small to moderate stool in the right colon. Enteric chain sutures are noted at the sigmoid colon. Equivocal bowel wall thickening adjacent to the enteric sutures. The majority of the colon is decompressed.  Detailed evaluation is limited given lack of contrast and patient motion artifact. Calcified gallstones within physiologically distended gallbladder. The unenhanced liver is unremarkable. Small splenic granulomas. Distal pancreas is normal, proximal mid pancreas are obscured. There is no hydronephrosis. Mild thinning of both renal parenchyma.  Patient is post abdominal aortic aneurysm repair. The aneurysm sac has increased from prior exam currently measuring 5.9 x 7.3 cm, there are adjacent coils  from presumed endoleak repair. The coils cause significant surrounding streak artifact. No definite periaortic soft tissue stranding.  No free intra-abdominal air. Small amount of free fluid in the left pericolic gutter. There is whole body wall edema consistent with third spacing.  Within the pelvis the urinary bladder is decompressed by Foley catheter. Prostate gland appears prominent in size.  Minimal compression deformity superior endplate of L2, age indeterminate but new from prior exam. Scattered bone islands are seen.  IMPRESSION: 1. Fluid distending the stomach and proximal duodenum, with an enteric tube in place. The remaining bowel loops are decompressed. This can be seen in the setting of gastroparesis. 2. Abdominal aortic aneurysm with coiling of presumed endoleak. The aneurysm sac has increased from exam 6 years prior, no interval exams to evaluate for comparison. Currently the aneurysm sac measures 5.9 x 7.3 cm. No definite surrounding soft tissue stranding, however streak artifact from coils partially limits evaluation. 3. Cholelithiasis without CT findings of cholecystitis. 4. Bilateral pleural effusions with adjacent compressive atelectasis in both lower lobes. Whole body wall edema and small amount of intra-abdominal free fluid. Constellation of findings consistent with congestive heart failure and third spacing. 5. Questionable bowel wall thickening in the sigmoid colon adjacent to enteric chain sutures. This simply could be related to nondistention, short segment of bowel inflammation is considered.   Electronically Signed   By: Rubye Oaks M.D.   On: 05/07/2014 05:11   Dg Chest 2 View  05/03/2014   CLINICAL DATA:  Weakness shortness of breath congestion for 3 days, multiple falls in the past 2 weeks  EXAM: CHEST  2 VIEW  COMPARISON:  04/25/2014  FINDINGS: Stable moderate cardiac silhouette enlargement. Moderate vascular congestion. Moderate interstitial and alveolar edema. Unchanged  small to moderate left effusion with left lower lobe consolidation.  IMPRESSION: Congestive heart failure with mildly increased pulmonary edema.   Electronically Signed   By: Esperanza Heir M.D.   On: 05/03/2014 10:30   Dg Chest 2 View  05/04/2014   CLINICAL DATA:  Initial evaluation for weakness, shortness of breath, history of atrial fibrillation  EXAM: CHEST  2 VIEW  COMPARISON:  12/09/2013  FINDINGS: Moderate enlargement of cardiac silhouette. Moderate vascular congestion. Peribronchial wall thickening and mild interstitial prominence with mild hazy density right perihilar area. Chronic pleural parenchymal thickening left lung base. Cardiac defibrillator again noted.  IMPRESSION: Congestive heart failure with mild interstitial pulmonary edema   Electronically Signed   By: Esperanza Heir M.D.   On: 04/17/2014 10:43   Ct Head Wo Contrast  04/14/2014   CLINICAL DATA:  Status post fall; hit right superior orbital rim. Patient on blood thinners. Initial encounter.  EXAM: CT HEAD WITHOUT CONTRAST  TECHNIQUE: Contiguous axial images were obtained from the base of the skull through the  vertex without intravenous contrast.  COMPARISON:  CT of the head performed 01/07/2005, and CTA of the head performed 04/10/2007  FINDINGS: There is no evidence of acute infarction, mass lesion, or intra- or extra-axial hemorrhage on CT.  Prominence of the ventricles and sulci reflects mild cortical volume loss. Cerebellar atrophy is noted. Mild periventricular and subcortical white matter change likely reflects small vessel ischemic microangiopathy.  The brainstem and fourth ventricle are within normal limits. The basal ganglia are unremarkable in appearance. The cerebral hemispheres demonstrate grossly normal gray-white differentiation. No mass effect or midline shift is seen.  There is no evidence of fracture; visualized osseous structures are unremarkable in appearance. The visualized portions of the orbits are within normal  limits. The paranasal sinuses and mastoid air cells are well-aerated. Soft tissue swelling is noted overlying the right frontal calvarium, just superior to the right orbit.  IMPRESSION: 1. No evidence of traumatic intracranial injury or fracture. 2. Mild cortical volume loss and scattered small vessel ischemic microangiopathy. 3. Soft tissue swelling overlying the right frontal calvarium, just superior to the right orbit.   Electronically Signed   By: Roanna Raider M.D.   On: 04/14/2014 22:22   US Abdomen Complete  05/02/2014   CLINICAL DATA:  Transaminasemia, hyperbilirubinemia, acute on chronic renal failure  EXAM: ULTRASOUND ABDOMEN COMPLETE  COMPARISON:  CT 01/12/2009  FINDINGS: Gallbladder: There are multiple calculi in the gallbladder lumen measuring up to 11 mm. There is no gallbladder wall thickening or pericholecystic fluid. The patient was not tender over the gallbladder.  Common bile duct: Diameter: 3.6 mm, normal  Liver: There is increased echogenicity of hepatic parenchyma consistent with fatty infiltration. No focal liver lesions are evident.  IVC: No abnormality visualized.  Pancreas: Not visualized  Spleen: Size and appearance within normal limits.  Right Kidney: Length: 10.7 cm. Echogenicity within normal limits. No mass or hydronephrosis visualized.  Left Kidney: Length: 13.1 cm. Echogenicity within normal limits. No mass or hydronephrosis visualized.  Abdominal aorta: Not visible  Other findings: Bilateral pleural effusions  IMPRESSION: 1. Cholelithiasis without evidence of cholecystitis 2. Dense liver, likely fatty infiltration 3. Nonvisualization of the pancreas and aorta 4. Unremarkable kidneys, negative for hydronephrosis 5. Bilateral pleural effusions   Electronically Signed   By: Ellery Plunk M.D.   On: 05/02/2014 06:05   Dg Chest Portable 1 View  05/07/2014   CLINICAL DATA:  81 year old male with respiratory failure and increasing shortness of breath.  EXAM: PORTABLE CHEST - 1  VIEW  COMPARISON:  Chest x-ray 05/06/2014.  FINDINGS: There is a right upper extremity PICC with tip terminating in the distal superior vena cava. A nasogastric tube is seen extending into the stomach, however, the tip of the nasogastric tube extends below the lower margin of the image. Lung volumes are low. Extensive bibasilar opacities (left greater than right), which may reflect areas of atelectasis and/or consolidation, with superimposed moderate bilateral pleural effusions. Cephalization of the pulmonary vasculature is noted, with indistinctness of interstitial markings and patchy mid to upper lung airspace disease, suggesting underlying edema. Mild cardiomegaly. Upper mediastinal contours are within normal limits. Atherosclerosis in the thoracic aorta. Status post median sternotomy for CABG, including LIMA. Left-sided pacemaker/AICD with lead tip terminating right ventricular apex.  IMPRESSION: 1. Worsening aeration in the lungs bilaterally, with an appearance that is most suggestive of worsening congestive heart failure. The possibility of superimposed airspace consolidation from infection or aspiration in the mid to lower lungs bilaterally is not excluded. 2. Postoperative changes  and support apparatus, as above. 3. Atherosclerosis.   Electronically Signed   By: Trudie Reed M.D.   On: 05/07/2014 09:25   Dg Chest Port 1 View  05/06/2014   CLINICAL DATA:  Vomiting, cough.  EXAM: PORTABLE CHEST - 1 VIEW  COMPARISON:  Chest radiograph obtained yesterday as well as 05/03/2014.  FINDINGS: Tip of the right upper extremity PICC in the mid SVC. Single lead left-sided pacemaker remains in place. Patient is post median sternotomy. Unchanged retrocardiac opacity in left pleural effusion. There is increasing hazy opacity at the right lung base likely combination of pleural effusion and airspace disease. Mild vascular congestion, diminished pulmonary edema. There is stable cardiomegaly. No pneumothorax.   IMPRESSION: Increasing hazy opacity at the right lung base likely combination of pleural effusion and airspace disease, may reflect atelectasis, pneumonia versus aspiration. Unchanged retrocardiac opacity and left pleural effusion.   Electronically Signed   By: Rubye Oaks M.D.   On: 05/06/2014 23:17   Dg Chest Port 1 View  05/05/2014   CLINICAL DATA:  PICC line placement.  EXAM: PORTABLE CHEST - 1 VIEW  COMPARISON:  05/03/2014  FINDINGS: The heart is enlarged but stable. The PICC line tip is in the mid SVC at the level of the carina. No complicating features. The right ventricular pacer wires/AICD is stable. Persistent left pleural effusion and left lower lobe atelectasis or infiltrate. Slight improved right lung aeration.  IMPRESSION: Right PICC line tip is in the mid SVC.   Electronically Signed   By: Rudie Meyer M.D.   On: 05/05/2014 16:13   Dg Abd Portable 1v  05/06/2014   CLINICAL DATA:  Acute onset of vomiting.  Initial encounter.  EXAM: PORTABLE ABDOMEN - 1 VIEW  COMPARISON:  CT of the abdomen and pelvis performed 01/12/2009  FINDINGS: The visualized bowel gas pattern is unremarkable. Scattered air and stool filled loops of colon are seen; no abnormal dilatation of small bowel loops is seen to suggest small bowel obstruction. Much of the visualized colon is partially distended with air. No free intra-abdominal air is identified, though evaluation for free air is limited on a single supine view.  An aortoiliac stent graft is partially characterized. Coiling is noted at the distal abdominal aorta. Scattered postoperative change is seen at the left lower quadrant.  The visualized osseous structures are within normal limits; the sacroiliac joints are unremarkable in appearance.  IMPRESSION: Unremarkable bowel gas pattern; no free intra-abdominal air seen. Moderate amount of stool noted in the colon. Mild distention of the visualized colon may reflect mild colonic dysmotility.   Electronically Signed    By: Roanna Raider M.D.   On: 05/06/2014 23:19    PHYSICAL EXAM General: NAD, frail Neck: JVP not elevated, no thyromegaly or thyroid nodule.  Lungs: Diffuse rhonchi. CV: Nondisplaced PMI.  Heart irregular S1/S2, +S3, 2/6 systolic murmur RUSB and apex.  1+ edema ankles.    Abdomen: Soft, nontender, no hepatosplenomegaly, no distention.  Neurologic: Very lethargic.   Psych: Normal affect. Extremities: No clubbing or cyanosis.   TELEMETRY: Reviewed telemetry pt in atrial fibrillation in 90s  ASSESSMENT AND PLAN: 81 yo with history of CAD s/p CABG, ischemic cardiomyopathy EF 20-25% on last echo, persistent atrial fibrillation failed Tikosyn, and CKD presented with weakness, acute on chronic systolic CHF.  He was thought to have low output. He initially improved with milrinone and diuresis but then developed ileus with profuse nausea/vomiting. NGT placed. I suspect he aspirated as he developed hypoxia and  hypotension.  Initially on pressors and NRBM, decision made to proceed with comfort care yesterday.  Now off pressors and on morphine gtt.  He is unresponsive and hypotensive with SBP 60s all night.  Will ask palliative care to see, hopefully can move him to hospice unit. Family at bedside.  Marca AnconaDalton [name] 03-17-2014

## 2014-05-16 NOTE — Progress Notes (Signed)
Physical Therapy Discharge Patient Details Name: [name] BoyerJohn E [name] MRN: [id number] DOB: 07/17/1926 Today's Date: 04/15/2014 Time:  -     Patient discharged from PT services secondary to medical decline.  Please see latest therapy progress note for current level of functioning and progress toward goals.    Progress and discharge plan discussed with patient and/or caregiver: N/A  GP     [facility] LLCMAYCOCK,Barrie Sigmund 04/22/2014, 9:14 AM

## 2014-05-16 NOTE — Progress Notes (Signed)
Patient is comfort measures only, family is at bedside, per there request they want to continue monitoring blood pressures was able to talk them into every 2 hours, instead of every 30 minutes.  He remains on 10 mg morphine per hour, he is unresponsive, unlabored breathing looks peaceful. He has shallow breathing with short periods of apnea.  Minimal physical assessment completed at start of shift.  Will continue to monitor closely.  Will continue to support family through transition.

## 2014-05-16 NOTE — Progress Notes (Signed)
Nutrition Brief Note  Chart reviewed. Pt now transitioning to comfort care.  No further nutrition interventions warranted at this time.  Please re-consult as needed.   Zayon Trulson A. Mita Vallo, RD, LDN, CDE Pager: [phone] After hours Pager: [phone]  

## 2014-05-16 DEATH — deceased

## 2014-05-30 NOTE — Discharge Summary (Signed)
81 yo with history of CAD s/p CABG, ischemic cardiomyopathy EF 20-25% on last echo, persistent atrial fibrillation failed Tikosyn, and CKD presented with weakness, acute on chronic systolic CHF. He was thought to have low output. He initially improved with milrinone and diuresis but then developed ileus with profuse nausea/vomiting. NGT placed. I suspect he aspirated as he developed hypoxia and hypotension. Initially put on pressors and NRBM, decision made to proceed with comfort care. He was seen by palliative care service and expired in the hospital.   Marca Anconaalton Angele Wiemann

## 2015-11-22 IMAGING — US US ABDOMEN COMPLETE
1 series · 14 of 25 positions shown · non-contrast
Comparison: CT 01/12/2009

CLINICAL DATA: Transaminasemia, hyperbilirubinemia, acute on
chronic renal failure

EXAM:
ULTRASOUND ABDOMEN COMPLETE

[Series 1: us abdomen complete · 0.17mm/px · 14 of 101 slices shown]
[im 1/101]
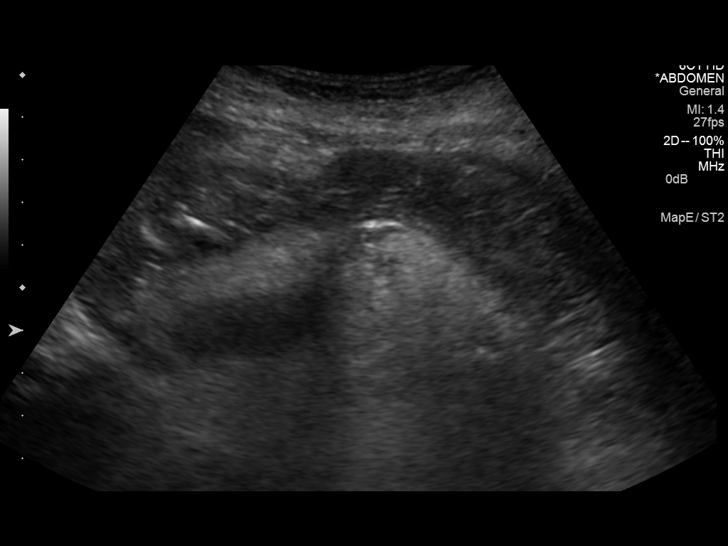
[im 9/101]
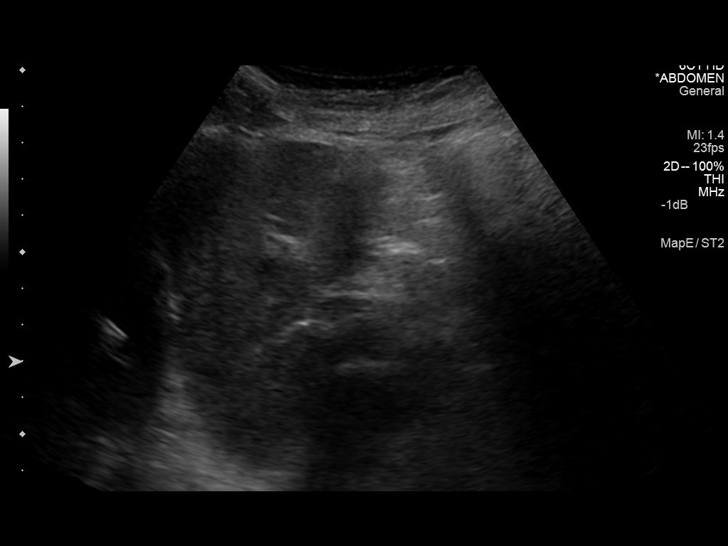
[im 17/101]
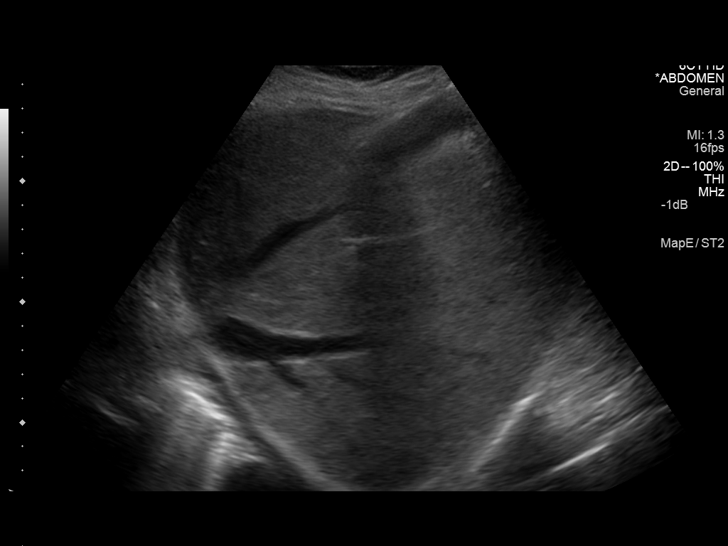
[im 26/101]
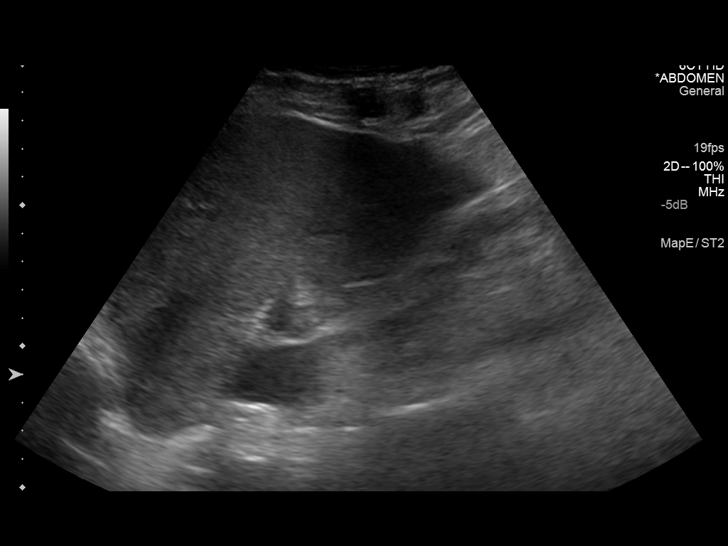
[im 34/101]
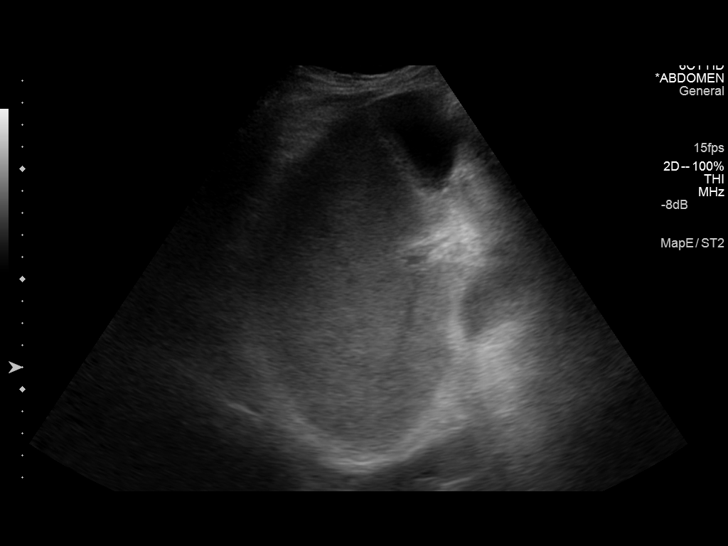
[im 38/101]
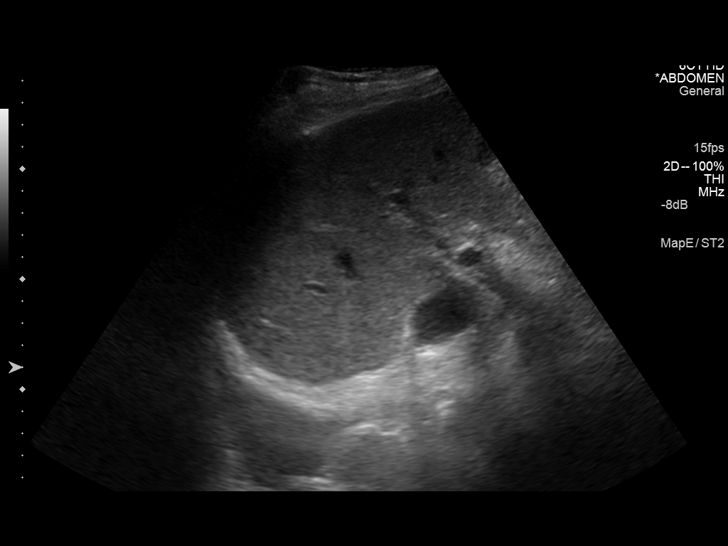
[im 46/101]
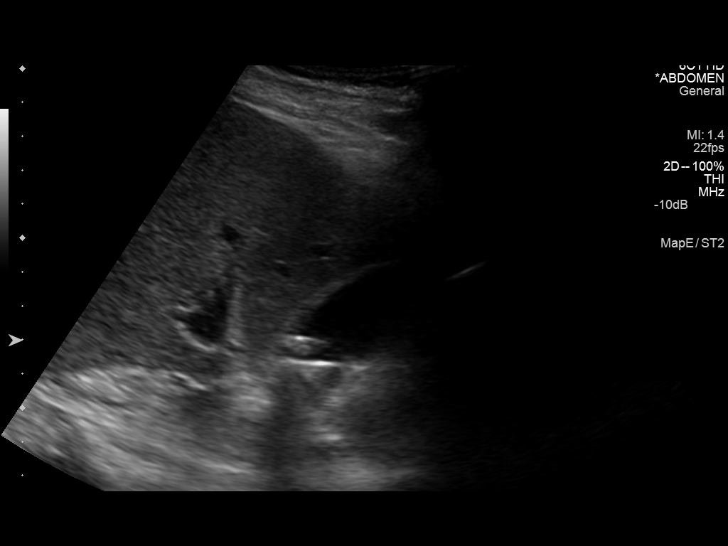
[im 55/101]
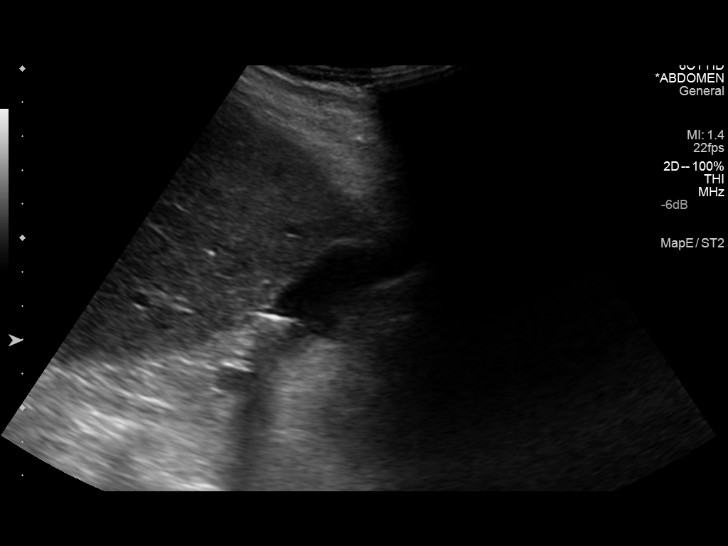
[im 63/101]
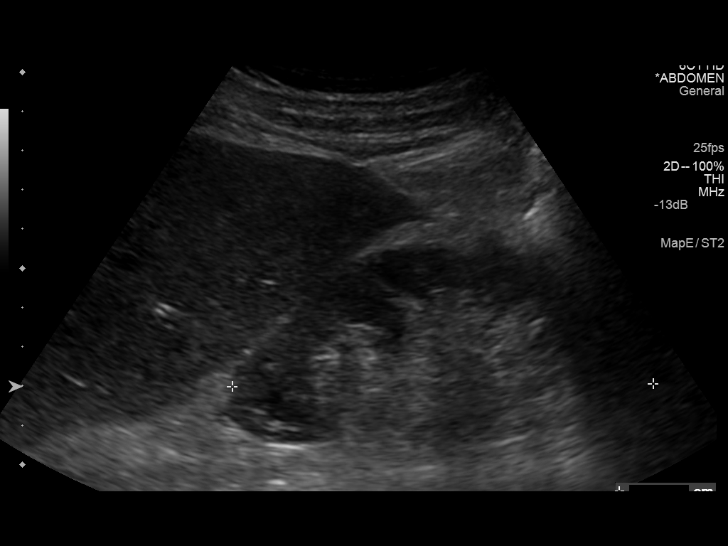
[im 67/101]
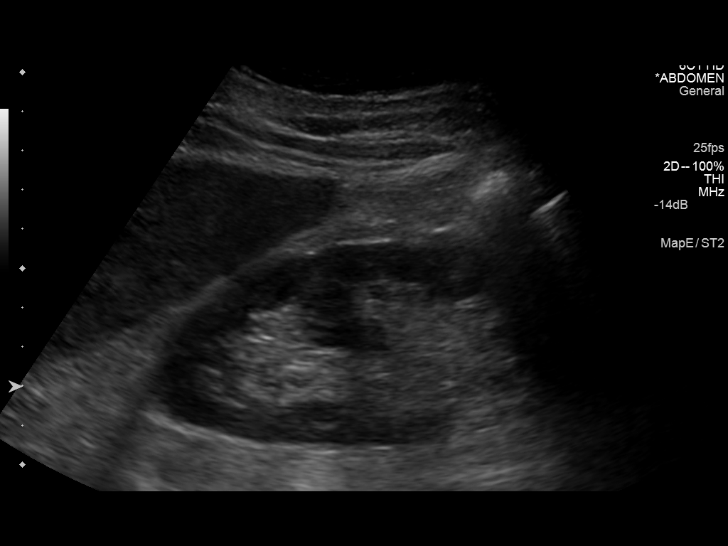
[im 76/101]
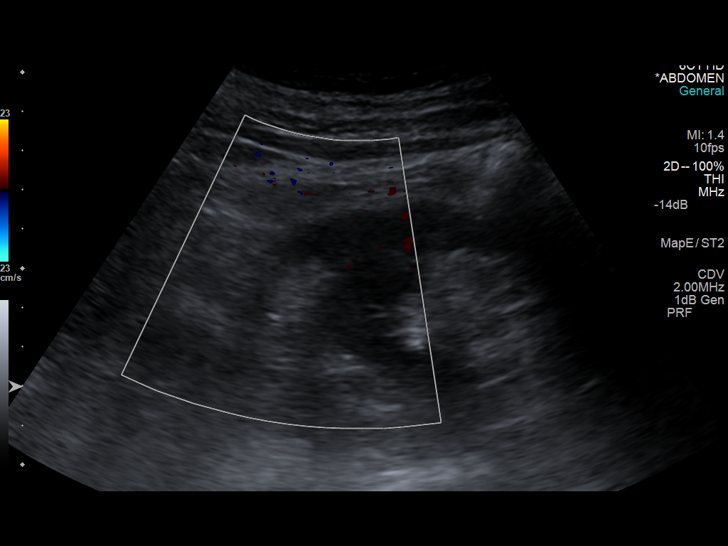
[im 84/101]
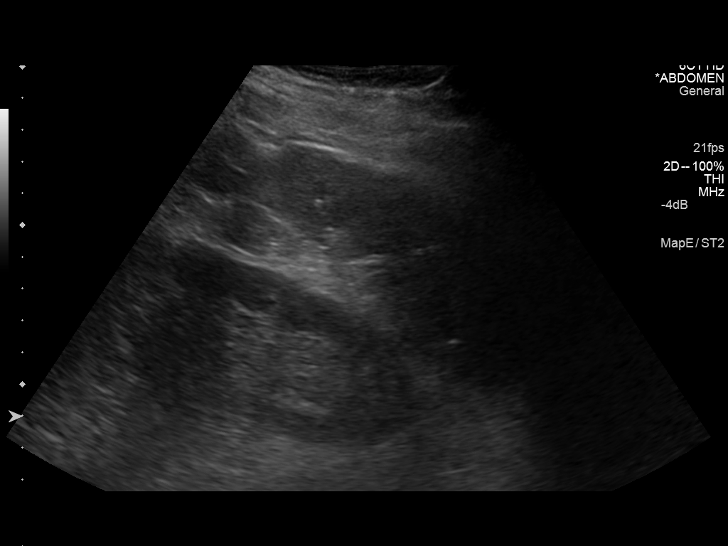
[im 92/101]
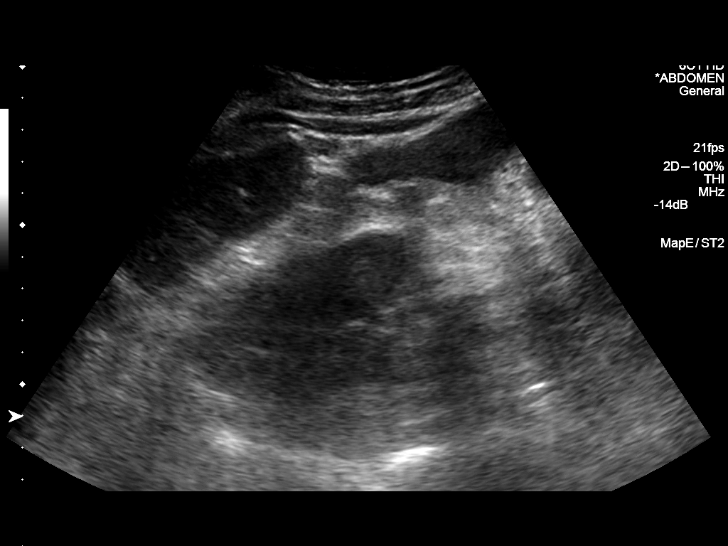
[im 101/101]
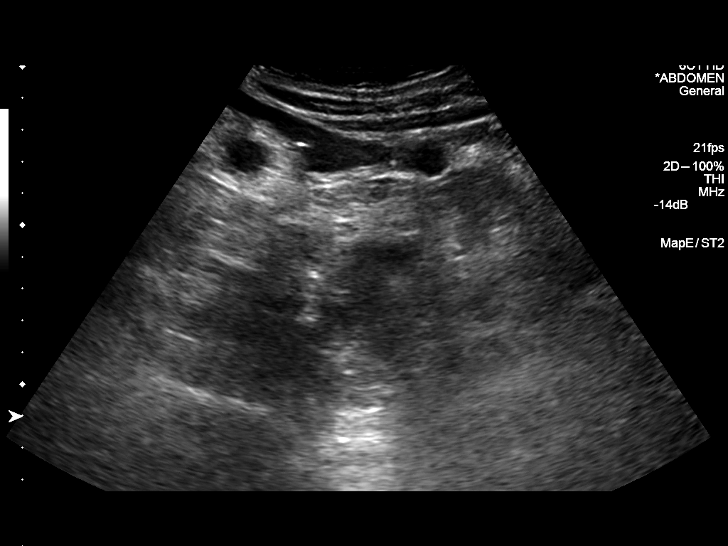

[14 of 25 positions shown; findings below may reference images not displayed]

FINDINGS: Gallbladder: There are multiple calculi in the gallbladder lumen
measuring up to 11 mm. There is no gallbladder wall thickening or
pericholecystic fluid. The patient was not tender over the
gallbladder.

Common bile duct: Diameter: 3.6 mm, normal

Liver: There is increased echogenicity of hepatic parenchyma
consistent with fatty infiltration. No focal liver lesions are
evident.

IVC: No abnormality visualized.

Pancreas: Not visualized

Spleen: Size and appearance within normal limits.

Right Kidney: Length: 10.7 cm. Echogenicity within normal limits. No
mass or hydronephrosis visualized.

Left Kidney: Length: 13.1 cm. Echogenicity within normal limits. No
mass or hydronephrosis visualized.

Abdominal aorta: Not visible

Other findings: Bilateral pleural effusions
IMPRESSION: 1. Cholelithiasis without evidence of cholecystitis
2. Dense liver, likely fatty infiltration
3. Nonvisualization of the pancreas and aorta
4. Unremarkable kidneys, negative for hydronephrosis
5. Bilateral pleural effusions

## 2015-11-23 IMAGING — CR DG CHEST 2V
3 series · 3 of 3 positions shown · non-contrast
Comparison: 05/01/2014

CLINICAL DATA: Weakness shortness of breath congestion for 3 days,
multiple falls in the past 2 weeks

EXAM:
CHEST  2 VIEW

[w chest lat (1 of 2)]
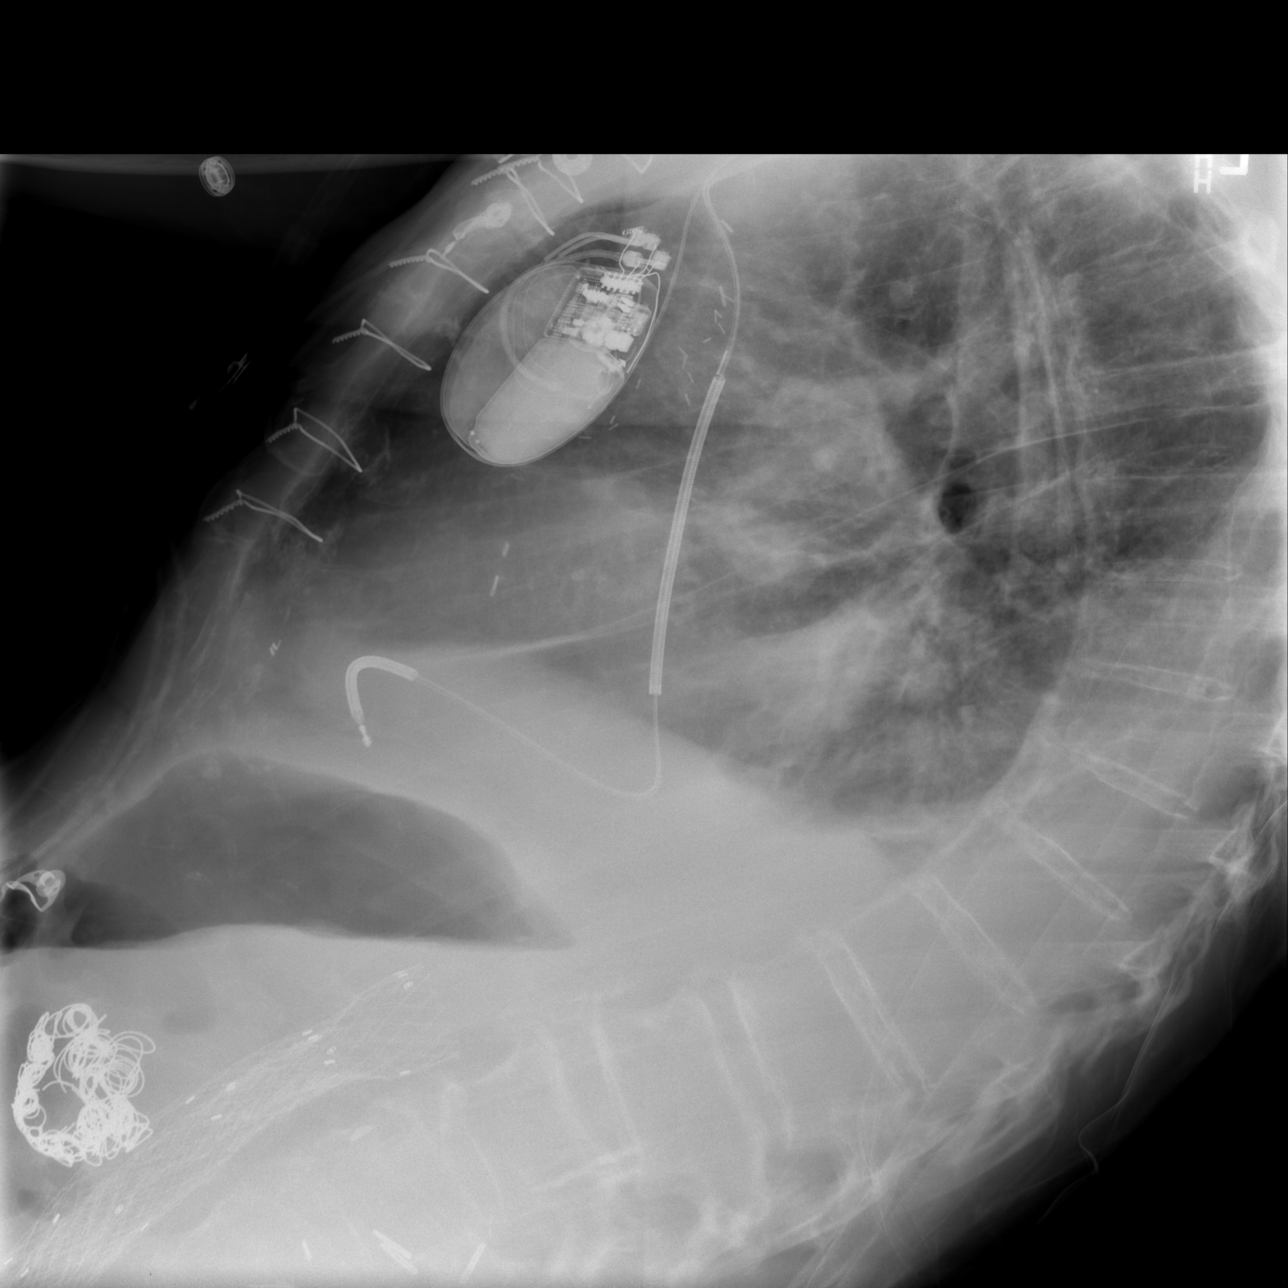

[w chest lat (2 of 2)]
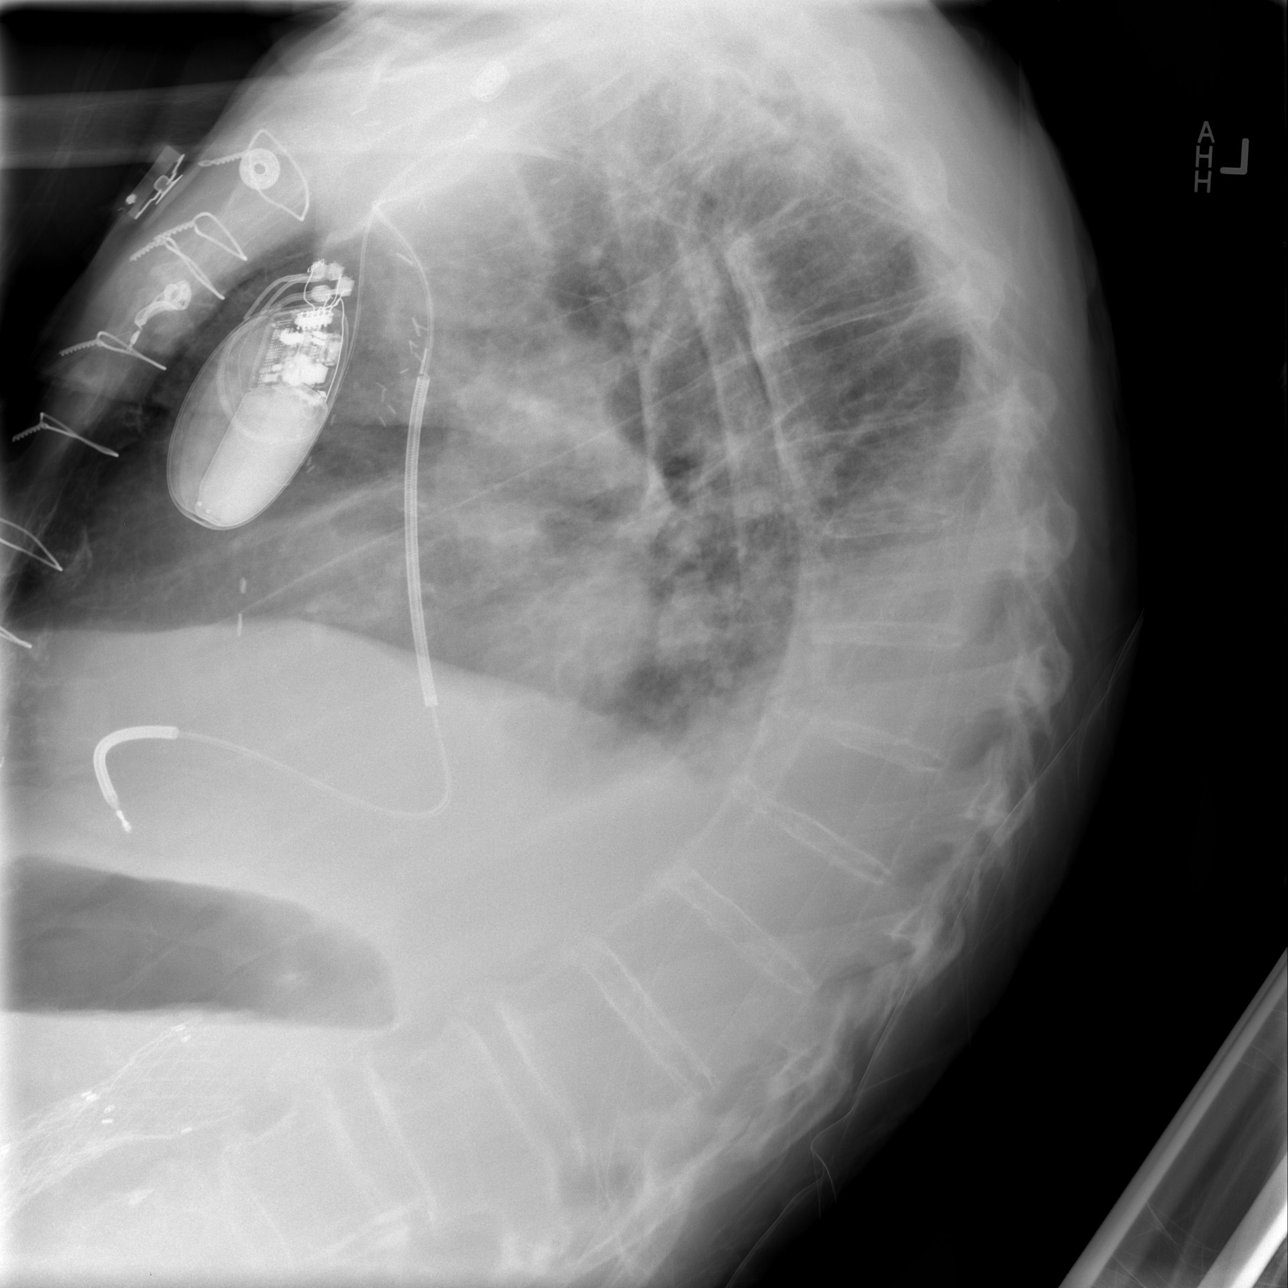

[view not recorded]
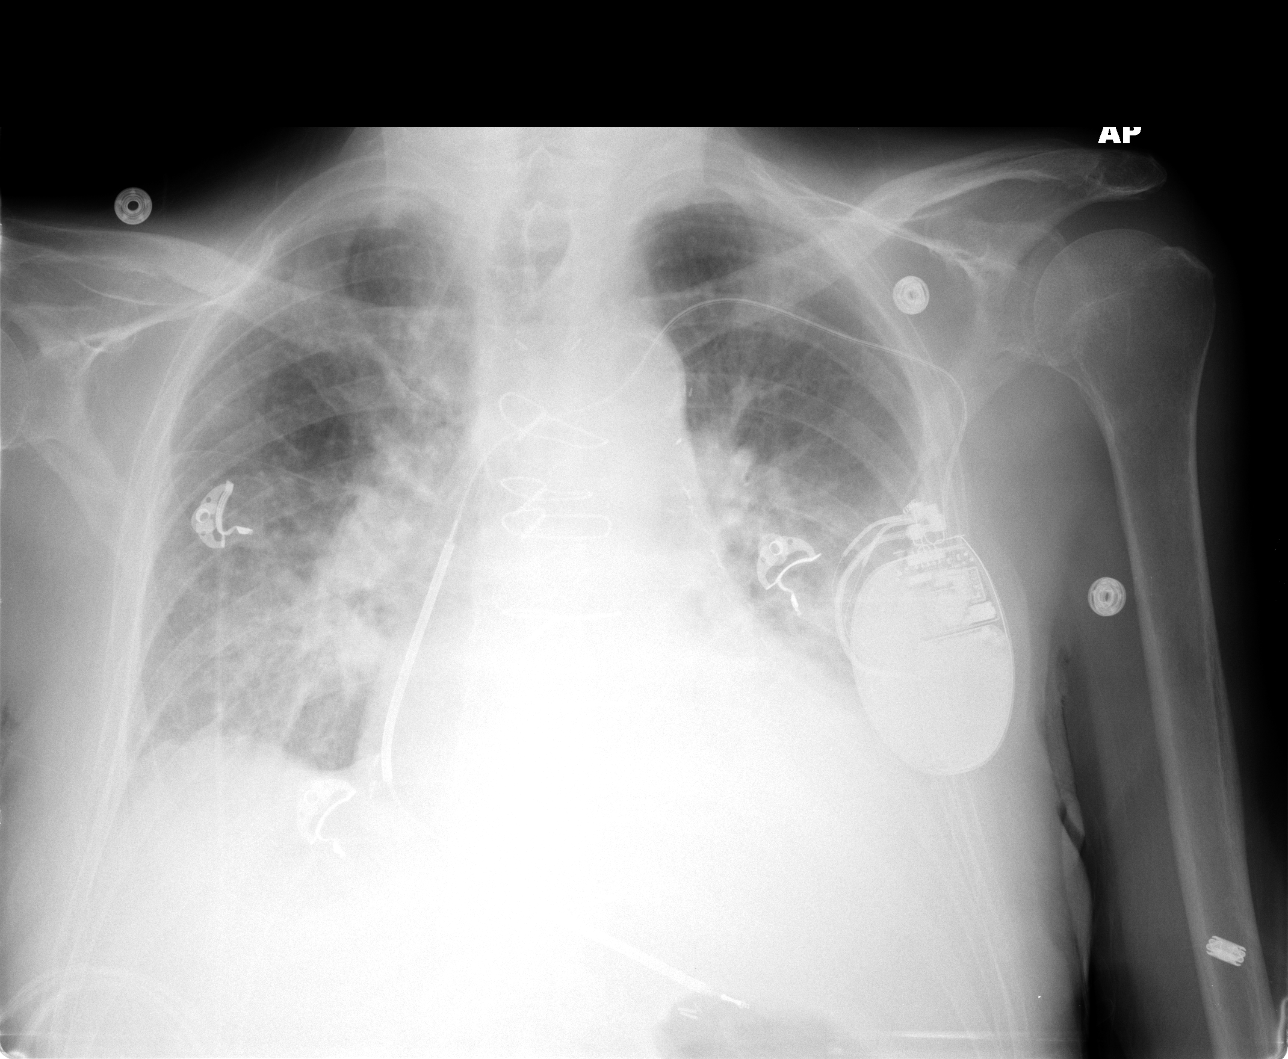

[3 of 3 positions shown; findings below may reference images not displayed]

FINDINGS: Stable moderate cardiac silhouette enlargement. Moderate vascular
congestion. Moderate interstitial and alveolar edema. Unchanged
small to moderate left effusion with left lower lobe consolidation.
IMPRESSION: Congestive heart failure with mildly increased pulmonary edema.

## 2015-11-25 IMAGING — CR DG CHEST 1V PORT
1 series · 1 of 1 positions shown · non-contrast
Comparison: 05/03/2014

CLINICAL DATA: PICC line placement.

EXAM:
PORTABLE CHEST - 1 VIEW

[AP]
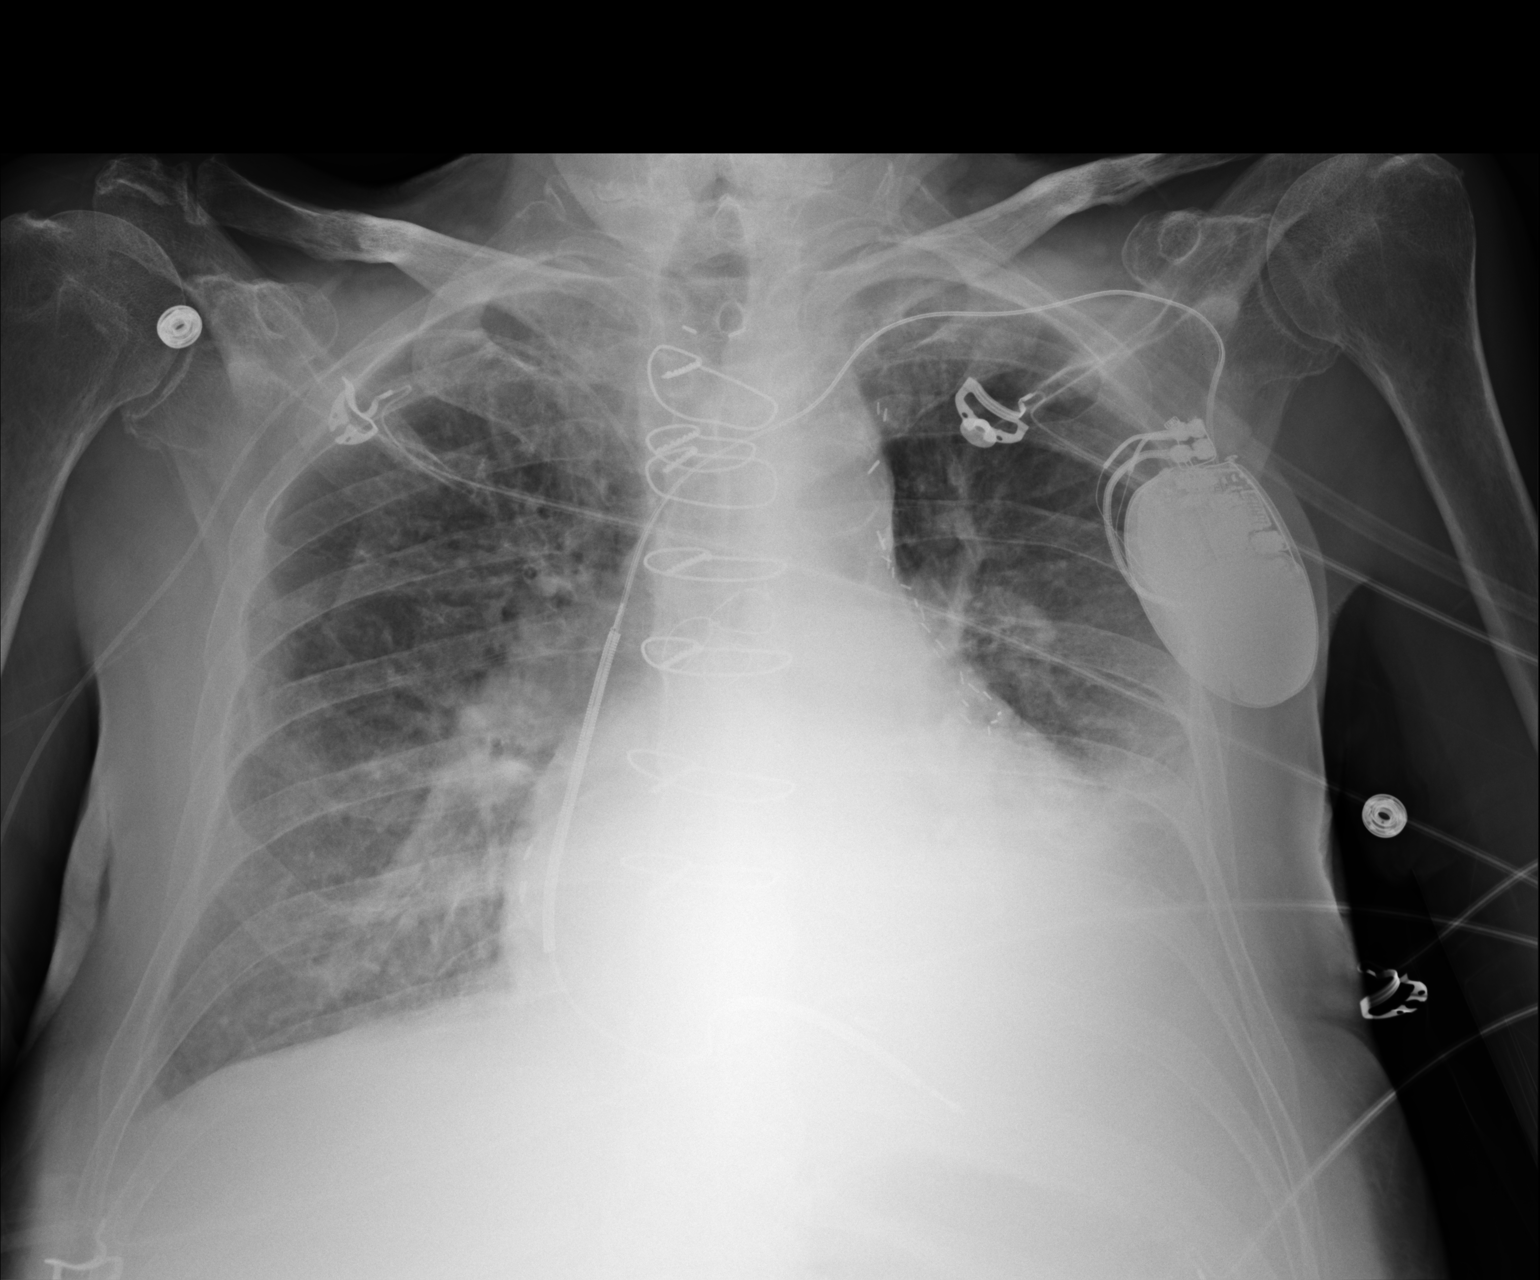

[1 of 1 positions shown; findings below may reference images not displayed]

FINDINGS: The heart is enlarged but stable. The PICC line tip is in the mid
SVC at the level of the carina. No complicating features. The right
ventricular pacer wires/AICD is stable. Persistent left pleural
effusion and left lower lobe atelectasis or infiltrate. Slight
improved right lung aeration.
IMPRESSION: Right PICC line tip is in the mid SVC.
